# Patient Record
Sex: Female | Born: 1950 | Race: White | Hispanic: No | Marital: Married | Smoking: Never smoker
Health system: Southern US, Community
[De-identification: ages and names within clinical notes are randomized; demographics above are authoritative.]

## PROBLEM LIST (undated history)

## (undated) DIAGNOSIS — K219 Gastro-esophageal reflux disease without esophagitis: Secondary | ICD-10-CM

## (undated) DIAGNOSIS — C50919 Malignant neoplasm of unspecified site of unspecified female breast: Secondary | ICD-10-CM

## (undated) DIAGNOSIS — I10 Essential (primary) hypertension: Secondary | ICD-10-CM

## (undated) DIAGNOSIS — Z923 Personal history of irradiation: Secondary | ICD-10-CM

## (undated) DIAGNOSIS — J479 Bronchiectasis, uncomplicated: Secondary | ICD-10-CM

## (undated) DIAGNOSIS — Z853 Personal history of malignant neoplasm of breast: Secondary | ICD-10-CM

## (undated) DIAGNOSIS — H8110 Benign paroxysmal vertigo, unspecified ear: Secondary | ICD-10-CM

## (undated) DIAGNOSIS — M8580 Other specified disorders of bone density and structure, unspecified site: Secondary | ICD-10-CM

## (undated) HISTORY — DX: Personal history of malignant neoplasm of breast: Z85.3

## (undated) HISTORY — PX: APPENDECTOMY: SHX54

## (undated) HISTORY — DX: Gastro-esophageal reflux disease without esophagitis: K21.9

## (undated) HISTORY — DX: Essential (primary) hypertension: I10

## (undated) HISTORY — DX: Other specified disorders of bone density and structure, unspecified site: M85.80

## (undated) HISTORY — PX: ABDOMINAL HYSTERECTOMY: SHX81

## (undated) HISTORY — DX: Benign paroxysmal vertigo, unspecified ear: H81.10

---

## 1995-04-20 HISTORY — PX: NASAL SINUS SURGERY: SHX719

## 1997-08-08 ENCOUNTER — Other Ambulatory Visit: Admission: RE | Admit: 1997-08-08 | Discharge: 1997-08-08 | Payer: Self-pay | Admitting: Otolaryngology

## 1997-11-22 ENCOUNTER — Ambulatory Visit (HOSPITAL_COMMUNITY): Admission: RE | Admit: 1997-11-22 | Discharge: 1997-11-23 | Payer: Self-pay | Admitting: Otolaryngology

## 1998-03-10 ENCOUNTER — Ambulatory Visit (HOSPITAL_BASED_OUTPATIENT_CLINIC_OR_DEPARTMENT_OTHER): Admission: RE | Admit: 1998-03-10 | Discharge: 1998-03-10 | Payer: Self-pay | Admitting: Otolaryngology

## 1998-11-28 ENCOUNTER — Other Ambulatory Visit: Admission: RE | Admit: 1998-11-28 | Discharge: 1998-11-28 | Payer: Self-pay | Admitting: *Deleted

## 1999-12-09 ENCOUNTER — Other Ambulatory Visit: Admission: RE | Admit: 1999-12-09 | Discharge: 1999-12-09 | Payer: Self-pay | Admitting: *Deleted

## 2000-12-05 ENCOUNTER — Other Ambulatory Visit: Admission: RE | Admit: 2000-12-05 | Discharge: 2000-12-05 | Payer: Self-pay | Admitting: *Deleted

## 2001-12-11 ENCOUNTER — Other Ambulatory Visit: Admission: RE | Admit: 2001-12-11 | Discharge: 2001-12-11 | Payer: Self-pay | Admitting: *Deleted

## 2002-12-26 ENCOUNTER — Other Ambulatory Visit: Admission: RE | Admit: 2002-12-26 | Discharge: 2002-12-26 | Payer: Self-pay | Admitting: *Deleted

## 2003-01-03 ENCOUNTER — Encounter: Admission: RE | Admit: 2003-01-03 | Discharge: 2003-01-03 | Payer: Self-pay | Admitting: *Deleted

## 2003-01-03 ENCOUNTER — Encounter: Payer: Self-pay | Admitting: *Deleted

## 2003-06-27 ENCOUNTER — Encounter: Admission: RE | Admit: 2003-06-27 | Discharge: 2003-06-27 | Payer: Self-pay | Admitting: *Deleted

## 2003-12-27 ENCOUNTER — Other Ambulatory Visit: Admission: RE | Admit: 2003-12-27 | Discharge: 2003-12-27 | Payer: Self-pay | Admitting: Obstetrics and Gynecology

## 2004-01-07 ENCOUNTER — Encounter: Admission: RE | Admit: 2004-01-07 | Discharge: 2004-01-07 | Payer: Self-pay | Admitting: Obstetrics and Gynecology

## 2004-05-18 ENCOUNTER — Ambulatory Visit: Payer: Self-pay | Admitting: Internal Medicine

## 2004-11-17 ENCOUNTER — Ambulatory Visit: Payer: Self-pay | Admitting: Internal Medicine

## 2004-11-17 ENCOUNTER — Ambulatory Visit (HOSPITAL_COMMUNITY): Admission: RE | Admit: 2004-11-17 | Discharge: 2004-11-17 | Payer: Self-pay | Admitting: Internal Medicine

## 2005-01-27 ENCOUNTER — Other Ambulatory Visit: Admission: RE | Admit: 2005-01-27 | Discharge: 2005-01-27 | Payer: Self-pay | Admitting: Obstetrics and Gynecology

## 2005-02-02 ENCOUNTER — Encounter: Admission: RE | Admit: 2005-02-02 | Discharge: 2005-02-02 | Payer: Self-pay | Admitting: Obstetrics and Gynecology

## 2005-06-01 ENCOUNTER — Ambulatory Visit: Payer: Self-pay | Admitting: Internal Medicine

## 2005-06-07 ENCOUNTER — Ambulatory Visit: Payer: Self-pay | Admitting: Internal Medicine

## 2005-12-06 ENCOUNTER — Ambulatory Visit: Payer: Self-pay | Admitting: Internal Medicine

## 2006-03-16 ENCOUNTER — Encounter: Admission: RE | Admit: 2006-03-16 | Discharge: 2006-03-16 | Payer: Self-pay | Admitting: Obstetrics and Gynecology

## 2006-03-28 ENCOUNTER — Encounter (INDEPENDENT_AMBULATORY_CARE_PROVIDER_SITE_OTHER): Payer: Self-pay | Admitting: Diagnostic Radiology

## 2006-03-28 ENCOUNTER — Encounter: Admission: RE | Admit: 2006-03-28 | Discharge: 2006-03-28 | Payer: Self-pay | Admitting: Obstetrics and Gynecology

## 2006-03-28 ENCOUNTER — Encounter (INDEPENDENT_AMBULATORY_CARE_PROVIDER_SITE_OTHER): Payer: Self-pay | Admitting: *Deleted

## 2006-04-03 ENCOUNTER — Encounter: Admission: RE | Admit: 2006-04-03 | Discharge: 2006-04-03 | Payer: Self-pay | Admitting: Obstetrics and Gynecology

## 2006-04-19 DIAGNOSIS — Z923 Personal history of irradiation: Secondary | ICD-10-CM

## 2006-04-19 DIAGNOSIS — C50919 Malignant neoplasm of unspecified site of unspecified female breast: Secondary | ICD-10-CM

## 2006-04-19 HISTORY — DX: Personal history of irradiation: Z92.3

## 2006-04-19 HISTORY — DX: Malignant neoplasm of unspecified site of unspecified female breast: C50.919

## 2006-04-19 HISTORY — PX: BREAST LUMPECTOMY: SHX2

## 2006-05-19 ENCOUNTER — Encounter (INDEPENDENT_AMBULATORY_CARE_PROVIDER_SITE_OTHER): Payer: Self-pay | Admitting: *Deleted

## 2006-05-19 ENCOUNTER — Ambulatory Visit (HOSPITAL_BASED_OUTPATIENT_CLINIC_OR_DEPARTMENT_OTHER): Admission: RE | Admit: 2006-05-19 | Discharge: 2006-05-19 | Payer: Self-pay | Admitting: Surgery

## 2006-05-19 ENCOUNTER — Encounter: Admission: RE | Admit: 2006-05-19 | Discharge: 2006-05-19 | Payer: Self-pay | Admitting: Surgery

## 2006-05-30 ENCOUNTER — Ambulatory Visit: Payer: Self-pay | Admitting: Internal Medicine

## 2006-06-17 ENCOUNTER — Ambulatory Visit (HOSPITAL_COMMUNITY): Admission: RE | Admit: 2006-06-17 | Discharge: 2006-06-17 | Payer: Self-pay | Admitting: Surgery

## 2006-06-17 ENCOUNTER — Encounter (INDEPENDENT_AMBULATORY_CARE_PROVIDER_SITE_OTHER): Payer: Self-pay | Admitting: Specialist

## 2006-07-12 ENCOUNTER — Ambulatory Visit: Payer: Self-pay | Admitting: Oncology

## 2006-07-22 ENCOUNTER — Ambulatory Visit: Admission: RE | Admit: 2006-07-22 | Discharge: 2006-09-30 | Payer: Self-pay | Admitting: *Deleted

## 2006-08-15 LAB — COMPREHENSIVE METABOLIC PANEL
ALT: 11 U/L (ref 0–35)
CO2: 31 mEq/L (ref 19–32)
Sodium: 144 mEq/L (ref 135–145)
Total Bilirubin: 0.4 mg/dL (ref 0.3–1.2)
Total Protein: 7.1 g/dL (ref 6.0–8.3)

## 2006-08-15 LAB — CBC WITH DIFFERENTIAL/PLATELET
BASO%: 0.2 % (ref 0.0–2.0)
EOS%: 1.9 % (ref 0.0–7.0)
HCT: 37.2 % (ref 34.8–46.6)
LYMPH%: 30.7 % (ref 14.0–48.0)
MCH: 30.9 pg (ref 26.0–34.0)
MCHC: 35.3 g/dL (ref 32.0–36.0)
MONO%: 6.9 % (ref 0.0–13.0)
NEUT%: 60.3 % (ref 39.6–76.8)
Platelets: 191 10*3/uL (ref 145–400)
RBC: 4.25 10*6/uL (ref 3.70–5.32)

## 2006-12-01 ENCOUNTER — Ambulatory Visit: Payer: Self-pay | Admitting: Oncology

## 2006-12-05 ENCOUNTER — Ambulatory Visit: Payer: Self-pay | Admitting: Internal Medicine

## 2007-03-13 ENCOUNTER — Encounter: Admission: RE | Admit: 2007-03-13 | Discharge: 2007-03-13 | Payer: Self-pay | Admitting: Obstetrics and Gynecology

## 2007-06-12 ENCOUNTER — Ambulatory Visit: Payer: Self-pay | Admitting: Internal Medicine

## 2007-06-12 DIAGNOSIS — K219 Gastro-esophageal reflux disease without esophagitis: Secondary | ICD-10-CM | POA: Insufficient documentation

## 2007-06-12 DIAGNOSIS — M899 Disorder of bone, unspecified: Secondary | ICD-10-CM

## 2007-06-12 DIAGNOSIS — M949 Disorder of cartilage, unspecified: Secondary | ICD-10-CM

## 2007-06-12 DIAGNOSIS — I10 Essential (primary) hypertension: Secondary | ICD-10-CM

## 2007-06-12 DIAGNOSIS — Z853 Personal history of malignant neoplasm of breast: Secondary | ICD-10-CM

## 2007-06-12 DIAGNOSIS — J309 Allergic rhinitis, unspecified: Secondary | ICD-10-CM

## 2007-11-30 ENCOUNTER — Ambulatory Visit: Payer: Self-pay | Admitting: Oncology

## 2007-12-04 ENCOUNTER — Ambulatory Visit: Payer: Self-pay | Admitting: Internal Medicine

## 2007-12-04 LAB — CONVERTED CEMR LAB
ALT: 17 units/L (ref 0–35)
AST: 21 units/L (ref 0–37)
Albumin: 3.7 g/dL (ref 3.5–5.2)
Alkaline Phosphatase: 54 units/L (ref 39–117)
BUN: 8 mg/dL (ref 6–23)
Basophils Absolute: 0 10*3/uL (ref 0.0–0.1)
Basophils Relative: 0.9 % (ref 0.0–3.0)
Bilirubin Urine: NEGATIVE
Bilirubin, Direct: 0.1 mg/dL (ref 0.0–0.3)
CO2: 32 meq/L (ref 19–32)
Calcium: 9.3 mg/dL (ref 8.4–10.5)
Chloride: 108 meq/L (ref 96–112)
Cholesterol: 208 mg/dL (ref 0–200)
Creatinine, Ser: 0.7 mg/dL (ref 0.4–1.2)
Direct LDL: 135.8 mg/dL
Eosinophils Absolute: 0.2 10*3/uL (ref 0.0–0.7)
Eosinophils Relative: 3.2 % (ref 0.0–5.0)
GFR calc Af Amer: 111 mL/min
GFR calc non Af Amer: 92 mL/min
Glucose, Bld: 97 mg/dL (ref 70–99)
HCT: 36.7 % (ref 36.0–46.0)
HDL: 44.9 mg/dL (ref >39.0)
Hemoglobin, Urine: NEGATIVE
Hemoglobin: 12.8 g/dL (ref 12.0–15.0)
Ketones, ur: NEGATIVE mg/dL
Leukocytes, UA: NEGATIVE
Lymphocytes Relative: 24.7 % (ref 12.0–46.0)
MCHC: 35 g/dL (ref 30.0–36.0)
MCV: 91.2 fL (ref 78.0–100.0)
Monocytes Absolute: 0.4 10*3/uL (ref 0.1–1.0)
Monocytes Relative: 8.1 % (ref 3.0–12.0)
Neutro Abs: 3.1 10*3/uL (ref 1.4–7.7)
Neutrophils Relative %: 63.1 % (ref 43.0–77.0)
Nitrite: NEGATIVE
Platelets: 135 10*3/uL — ABNORMAL LOW (ref 150–400)
Potassium: 4.1 meq/L (ref 3.5–5.1)
RBC: 4.02 M/uL (ref 3.87–5.11)
RDW: 11.4 % — ABNORMAL LOW (ref 11.5–14.6)
Sodium: 143 meq/L (ref 135–145)
Specific Gravity, Urine: 1.015 (ref 1.000–1.03)
TSH: 2.21 microintl units/mL (ref 0.35–5.50)
Total Bilirubin: 0.7 mg/dL (ref 0.3–1.2)
Total CHOL/HDL Ratio: 4.6
Total Protein, Urine: NEGATIVE mg/dL
Total Protein: 6.2 g/dL (ref 6.0–8.3)
Triglycerides: 194 mg/dL — ABNORMAL HIGH (ref 0–149)
Urine Glucose: NEGATIVE mg/dL
Urobilinogen, UA: 0.2 (ref 0.0–1.0)
VLDL: 39 mg/dL (ref 0–40)
WBC: 4.9 10*3/uL (ref 4.5–10.5)
pH: 7 (ref 5.0–8.0)

## 2007-12-11 ENCOUNTER — Ambulatory Visit: Payer: Self-pay | Admitting: Internal Medicine

## 2008-02-02 ENCOUNTER — Ambulatory Visit: Payer: Self-pay | Admitting: Internal Medicine

## 2008-03-04 ENCOUNTER — Ambulatory Visit: Payer: Self-pay | Admitting: Internal Medicine

## 2008-04-01 ENCOUNTER — Encounter: Admission: RE | Admit: 2008-04-01 | Discharge: 2008-04-01 | Payer: Self-pay | Admitting: Obstetrics and Gynecology

## 2008-06-11 ENCOUNTER — Ambulatory Visit: Payer: Self-pay | Admitting: Internal Medicine

## 2008-11-06 ENCOUNTER — Ambulatory Visit: Payer: Self-pay | Admitting: Internal Medicine

## 2008-11-06 DIAGNOSIS — J209 Acute bronchitis, unspecified: Secondary | ICD-10-CM | POA: Insufficient documentation

## 2008-11-29 ENCOUNTER — Ambulatory Visit: Payer: Self-pay | Admitting: Oncology

## 2008-12-03 ENCOUNTER — Ambulatory Visit: Payer: Self-pay | Admitting: Internal Medicine

## 2008-12-03 LAB — CONVERTED CEMR LAB
ALT: 14 units/L (ref 0–35)
AST: 17 units/L (ref 0–37)
Albumin: 3.8 g/dL (ref 3.5–5.2)
Alkaline Phosphatase: 44 units/L (ref 39–117)
BUN: 10 mg/dL (ref 6–23)
Basophils Absolute: 0 10*3/uL (ref 0.0–0.1)
Basophils Relative: 0.7 % (ref 0.0–3.0)
Bilirubin Urine: NEGATIVE
Bilirubin, Direct: 0.1 mg/dL (ref 0.0–0.3)
CO2: 33 meq/L — ABNORMAL HIGH (ref 19–32)
Calcium: 9 mg/dL (ref 8.4–10.5)
Chloride: 105 meq/L (ref 96–112)
Cholesterol: 205 mg/dL — ABNORMAL HIGH (ref 0–200)
Creatinine, Ser: 0.6 mg/dL (ref 0.4–1.2)
Direct LDL: 129.2 mg/dL
Eosinophils Absolute: 0.1 10*3/uL (ref 0.0–0.7)
Eosinophils Relative: 2 % (ref 0.0–5.0)
GFR calc non Af Amer: 109.28 mL/min (ref >60)
Glucose, Bld: 93 mg/dL (ref 70–99)
HCT: 35.2 % — ABNORMAL LOW (ref 36.0–46.0)
HDL: 42.7 mg/dL (ref >39.00)
Hemoglobin, Urine: NEGATIVE
Hemoglobin: 12 g/dL (ref 12.0–15.0)
Ketones, ur: NEGATIVE mg/dL
Leukocytes, UA: NEGATIVE
Lymphocytes Relative: 25.7 % (ref 12.0–46.0)
Lymphs Abs: 1.2 10*3/uL (ref 0.7–4.0)
MCHC: 34 g/dL (ref 30.0–36.0)
MCV: 90.8 fL (ref 78.0–100.0)
Monocytes Absolute: 0.4 10*3/uL (ref 0.1–1.0)
Monocytes Relative: 8.4 % (ref 3.0–12.0)
Neutro Abs: 3 10*3/uL (ref 1.4–7.7)
Neutrophils Relative %: 63.2 % (ref 43.0–77.0)
Nitrite: NEGATIVE
Platelets: 139 10*3/uL — ABNORMAL LOW (ref 150.0–400.0)
Potassium: 4.1 meq/L (ref 3.5–5.1)
RBC: 3.87 M/uL (ref 3.87–5.11)
RDW: 12.3 % (ref 11.5–14.6)
Sodium: 143 meq/L (ref 135–145)
Specific Gravity, Urine: 1.02 (ref 1.000–1.030)
TSH: 2.68 microintl units/mL (ref 0.35–5.50)
Total Bilirubin: 0.7 mg/dL (ref 0.3–1.2)
Total CHOL/HDL Ratio: 5
Total Protein, Urine: NEGATIVE mg/dL
Total Protein: 6.6 g/dL (ref 6.0–8.3)
Triglycerides: 180 mg/dL — ABNORMAL HIGH (ref 0.0–149.0)
Urine Glucose: NEGATIVE mg/dL
Urobilinogen, UA: 0.2 (ref 0.0–1.0)
VLDL: 36 mg/dL (ref 0.0–40.0)
WBC: 4.7 10*3/uL (ref 4.5–10.5)
pH: 7 (ref 5.0–8.0)

## 2008-12-11 ENCOUNTER — Ambulatory Visit: Payer: Self-pay | Admitting: Internal Medicine

## 2008-12-11 DIAGNOSIS — R519 Headache, unspecified: Secondary | ICD-10-CM | POA: Insufficient documentation

## 2008-12-11 DIAGNOSIS — R51 Headache: Secondary | ICD-10-CM | POA: Insufficient documentation

## 2009-01-29 ENCOUNTER — Ambulatory Visit: Payer: Self-pay | Admitting: Internal Medicine

## 2009-01-29 DIAGNOSIS — M25519 Pain in unspecified shoulder: Secondary | ICD-10-CM

## 2009-02-10 ENCOUNTER — Ambulatory Visit: Payer: Self-pay | Admitting: Internal Medicine

## 2009-03-06 ENCOUNTER — Ambulatory Visit: Payer: Self-pay | Admitting: Internal Medicine

## 2009-04-02 ENCOUNTER — Encounter: Admission: RE | Admit: 2009-04-02 | Discharge: 2009-04-02 | Payer: Self-pay | Admitting: Oncology

## 2009-05-09 ENCOUNTER — Ambulatory Visit: Payer: Self-pay | Admitting: Internal Medicine

## 2009-07-24 ENCOUNTER — Encounter: Payer: Self-pay | Admitting: Internal Medicine

## 2009-07-31 ENCOUNTER — Encounter: Payer: Self-pay | Admitting: Internal Medicine

## 2009-09-11 ENCOUNTER — Encounter: Payer: Self-pay | Admitting: Internal Medicine

## 2009-10-23 ENCOUNTER — Encounter: Payer: Self-pay | Admitting: Internal Medicine

## 2009-12-05 ENCOUNTER — Ambulatory Visit: Payer: Self-pay | Admitting: Oncology

## 2009-12-09 ENCOUNTER — Ambulatory Visit: Payer: Self-pay | Admitting: Internal Medicine

## 2009-12-09 LAB — CONVERTED CEMR LAB
ALT: 12 units/L (ref 0–35)
AST: 21 units/L (ref 0–37)
Albumin: 4 g/dL (ref 3.5–5.2)
Alkaline Phosphatase: 45 units/L (ref 39–117)
BUN: 9 mg/dL (ref 6–23)
Basophils Absolute: 0 10*3/uL (ref 0.0–0.1)
Basophils Relative: 0.9 % (ref 0.0–3.0)
Bilirubin Urine: NEGATIVE
Bilirubin, Direct: 0.1 mg/dL (ref 0.0–0.3)
CO2: 31 meq/L (ref 19–32)
Calcium: 9.2 mg/dL (ref 8.4–10.5)
Chloride: 105 meq/L (ref 96–112)
Cholesterol: 206 mg/dL — ABNORMAL HIGH (ref 0–200)
Creatinine, Ser: 0.7 mg/dL (ref 0.4–1.2)
Direct LDL: 119.4 mg/dL
Eosinophils Absolute: 0.1 10*3/uL (ref 0.0–0.7)
Eosinophils Relative: 1.3 % (ref 0.0–5.0)
GFR calc non Af Amer: 97.55 mL/min (ref >60)
Glucose, Bld: 86 mg/dL (ref 70–99)
HCT: 34.9 % — ABNORMAL LOW (ref 36.0–46.0)
HDL: 40.8 mg/dL (ref >39.00)
Hemoglobin, Urine: NEGATIVE
Hemoglobin: 11.9 g/dL — ABNORMAL LOW (ref 12.0–15.0)
Ketones, ur: NEGATIVE mg/dL
Leukocytes, UA: NEGATIVE
Lymphocytes Relative: 28.8 % (ref 12.0–46.0)
Lymphs Abs: 1.4 10*3/uL (ref 0.7–4.0)
MCHC: 34 g/dL (ref 30.0–36.0)
MCV: 91.3 fL (ref 78.0–100.0)
Monocytes Absolute: 0.4 10*3/uL (ref 0.1–1.0)
Monocytes Relative: 7.2 % (ref 3.0–12.0)
Neutro Abs: 3.1 10*3/uL (ref 1.4–7.7)
Neutrophils Relative %: 61.8 % (ref 43.0–77.0)
Nitrite: NEGATIVE
Platelets: 146 10*3/uL — ABNORMAL LOW (ref 150.0–400.0)
Potassium: 4.2 meq/L (ref 3.5–5.1)
RBC: 3.82 M/uL — ABNORMAL LOW (ref 3.87–5.11)
RDW: 12.7 % (ref 11.5–14.6)
Sodium: 145 meq/L (ref 135–145)
Specific Gravity, Urine: >=1.03 (ref 1.000–1.030)
TSH: 1.82 microintl units/mL (ref 0.35–5.50)
Total Bilirubin: 0.4 mg/dL (ref 0.3–1.2)
Total CHOL/HDL Ratio: 5
Total Protein, Urine: NEGATIVE mg/dL
Total Protein: 6.4 g/dL (ref 6.0–8.3)
Triglycerides: 283 mg/dL — ABNORMAL HIGH (ref 0.0–149.0)
Urine Glucose: NEGATIVE mg/dL
Urobilinogen, UA: 0.2 (ref 0.0–1.0)
VLDL: 56.6 mg/dL — ABNORMAL HIGH (ref 0.0–40.0)
WBC: 5 10*3/uL (ref 4.5–10.5)
pH: 5.5 (ref 5.0–8.0)

## 2009-12-12 ENCOUNTER — Ambulatory Visit: Payer: Self-pay | Admitting: Internal Medicine

## 2009-12-12 ENCOUNTER — Encounter: Payer: Self-pay | Admitting: Internal Medicine

## 2009-12-12 DIAGNOSIS — H6120 Impacted cerumen, unspecified ear: Secondary | ICD-10-CM

## 2009-12-12 DIAGNOSIS — D485 Neoplasm of uncertain behavior of skin: Secondary | ICD-10-CM

## 2009-12-19 ENCOUNTER — Ambulatory Visit: Payer: Self-pay | Admitting: Internal Medicine

## 2010-02-28 ENCOUNTER — Ambulatory Visit: Payer: Self-pay | Admitting: Family Medicine

## 2010-03-18 ENCOUNTER — Telehealth: Payer: Self-pay | Admitting: Internal Medicine

## 2010-04-21 ENCOUNTER — Encounter
Admission: RE | Admit: 2010-04-21 | Discharge: 2010-04-21 | Payer: Self-pay | Source: Home / Self Care | Attending: Oncology | Admitting: Oncology

## 2010-04-21 LAB — HM MAMMOGRAPHY

## 2010-05-09 ENCOUNTER — Encounter: Payer: Self-pay | Admitting: Obstetrics and Gynecology

## 2010-05-10 ENCOUNTER — Encounter: Payer: Self-pay | Admitting: Oncology

## 2010-05-10 ENCOUNTER — Encounter: Payer: Self-pay | Admitting: Obstetrics and Gynecology

## 2010-05-21 NOTE — Assessment & Plan Note (Signed)
Summary: sinus problem---stc   Vital Signs:  Patient profile:   60 year old female Height:      63 inches Weight:      116 pounds BMI:     20.62 O2 Sat:      96 % on Room air Temp:     97.5 degrees F oral Pulse rate:   64 / minute Pulse rhythm:   regular Resp:     16 per minute BP sitting:   144 / 86  (left arm) Cuff size:   regular  Vitals Entered By: Lanier Prude, CMA(AAMA) (December 19, 2009 3:46 PM)  O2 Flow:  Room air CC: Rt side sinus pain/pressure Is Patient Diabetic? No   Primary Care Provider:  Tresa Garter MD  CC:  Rt side sinus pain/pressure.  History of Present Illness: The patient presents with complaints of R HA,sore throat, fever, sinus congestion and drainge of several days duration. Not better with OTC meds.  Muscle aches are present.  The mucus is colored.   Current Medications (verified): 1)  Hydrochlorothiazide 25 Mg  Tabs (Hydrochlorothiazide) .... Take 1 Tab By Mouth Every Morning 2)  Klor-Con 10 10 Meq  Tbcr (Potassium Chloride) .Marland Kitchen.. 1 By Mouth Qd 3)  Tamoxifen Citrate 20 Mg  Tabs (Tamoxifen Citrate) .Marland Kitchen.. 1 By Mouth Qd 4)  Vitamin D3 1000 Unit  Tabs (Cholecalciferol) .Marland Kitchen.. 1 By Mouth Qd 5)  Flonase 50 Mcg/act  Susp (Fluticasone Propionate) .... 2 Spr Once Daily Each Nostr 6)  Fioricet 50-325-40 Mg  Tabs (Butalbital-Apap-Caffeine) .Marland Kitchen.. 1-2 By Mouth Two Times A Day As Needed Headache 7)  Nadolol 40 Mg Tabs (Nadolol) .... 1/2 By Mouth Bid 8)  Pennsaid 1.5 % Soln (Diclofenac Sodium) .... 3-5 Gtt On Skin Three Times A Day For Pain  Allergies (verified): 1)  ! Biaxin 2)  ! Cipro 3)  Pcn  Past History:  Past Medical History: Last updated: 06/12/2007 Allergic rhinitis Breast cancer, hx of 2008 L GERD Hypertension Osteopenia  Physical Exam  General:  alert, well-developed, well-nourished, well-hydrated, appropriate dress, normal appearance, healthy-appearing, cooperative to examination, and good hygiene.   Head:  normocephalic and  atraumatic.  R cheek is tender Nose:  External nasal examination shows no deformity or inflammation. Nasal mucosa are pink and moist without lesions or exudates. Mouth:  Oral mucosa and oropharynx without lesions or exudates.  Teeth in good repair. Neck:  supple, full ROM, no masses, no cervical lymphadenopathy, and no neck tenderness.   Lungs:  normal respiratory effort, no intercostal retractions, no accessory muscle use, normal breath sounds, and no dullness.   Heart:  normal rate, regular rhythm, no murmur, and no gallop.   Abdomen:  soft, non-tender, normal bowel sounds, no distention, and no masses.   Msk:  L subacromial tender trigger tender points over L deltoid were palpated Extremities:    Skin:  R flank mole small Cervical Nodes:  no anterior cervical adenopathy and no posterior cervical adenopathy.   Psych:  Cognition and judgment appear intact. Alert and cooperative with normal attention span and concentration. No apparent delusions, illusions, hallucinations   Impression & Recommendations:  Problem # 1:  SINUSITIS, ACUTE (ICD-461.9) Assessment New     Flonase 50 Mcg/act Susp (Fluticasone propionate) .Marland Kitchen... 2 spr once daily each nostr    Bactrim Ds 800-160 Mg Tabs (Sulfamethoxazole-trimethoprim) .Marland Kitchen... 1 by mouth bid  Complete Medication List: 1)  Hydrochlorothiazide 25 Mg Tabs (Hydrochlorothiazide) .... Take 1 tab by mouth every morning 2)  Klor-con 10 10 Meq Tbcr (Potassium chloride) .Marland Kitchen.. 1 by mouth qd 3)  Tamoxifen Citrate 20 Mg Tabs (Tamoxifen citrate) .Marland Kitchen.. 1 by mouth qd 4)  Vitamin D3 1000 Unit Tabs (Cholecalciferol) .Marland Kitchen.. 1 by mouth qd 5)  Flonase 50 Mcg/act Susp (Fluticasone propionate) .... 2 spr once daily each nostr 6)  Fioricet 50-325-40 Mg Tabs (Butalbital-apap-caffeine) .Marland Kitchen.. 1-2 by mouth two times a day as needed headache 7)  Nadolol 40 Mg Tabs (Nadolol) .... 1/2 by mouth bid 8)  Pennsaid 1.5 % Soln (Diclofenac sodium) .... 3-5 gtt on skin three times a day for  pain 9)  Bactrim Ds 800-160 Mg Tabs (Sulfamethoxazole-trimethoprim) .Marland Kitchen.. 1 by mouth bid  Patient Instructions: 1)  Use over-the-counter medicines for "cold": Tylenol  650mg  or Advil 400mg  every 6 hours r Mucinex D for congestion. Ricola or Halls for sore throat. Office visit if not better or if worse.  Prescriptions: BACTRIM DS 800-160 MG TABS (SULFAMETHOXAZOLE-TRIMETHOPRIM) 1 by mouth bid  #20 x 1   Entered and Authorized by:   Tresa Garter MD   Signed by:   Tresa Garter MD on 12/19/2009   Method used:   Electronically to        [facility]  [id number] (retail)       [address]       [place], [place]  [address]       Ph: [phone] or [phone]       Fax: [phone]   RxID:   [id number] BACTRIM DS 800-160 MG TABS (SULFAMETHOXAZOLE-TRIMETHOPRIM) 1 by mouth bid  #20 x 1   Entered and Authorized by:   Tresa Garter MD   Signed by:   Tresa Garter MD on 12/19/2009   Method used:   Print then Give to Patient   RxID:   [id number] DOXYCYCLINE HYCLATE 100 MG CAPS (DOXYCYCLINE HYCLATE) 1 by mouth two times a day with a glass of water  #20 x 0   Entered and Authorized by:   Tresa Garter MD   Signed by:   Tresa Garter MD on 12/19/2009   Method used:   Print then Give to Patient   RxID:   [id number]

## 2010-05-21 NOTE — Assessment & Plan Note (Signed)
Summary: ARM PAIN--STC   Vital Signs:  Patient profile:   60 year old female Weight:      117 pounds Temp:     98.4 degrees F oral Pulse rate:   71 / minute BP sitting:   122 / 72  (left arm)  Vitals Entered By: Tora Perches (May 09, 2009 3:01 PM)  Procedure Note  Injections: The patient complains of pain. Duration of symptoms: > 3 months Indication: chronic pain Consent signed: yes  Procedure # 1: joint injection    Region: lateral    Location: left shoulder    Technique: 24 g needle    Medication: 40 mg depomedrol    Anesthesia: 4.0 ml 1% lidocaine w/o epinephrine  Procedure # 2: trigger point injection    Region: lateral    Location: L deltoid    Technique: 24 g needle    Medication: 10 mg depomedrol    Anesthesia: 1.0 ml 1% lidocaine w/o epinephrine    Comment: Risks including but not limited by incomplete procedure, bleeding, infection, recurrence were discussed with the patient. Consent form was signed.  Total trigger point inj x 4 - same fasion. Tolerated well. Complicatons - none. Good pain relief following the procedure.   Cleaned and prepped with: alcohol and betadine Wound dressing: bandaid  CC: pt c/o arm pain Is Patient Diabetic? No   Primary Care Provider:  Tresa Garter MD  CC:  pt c/o arm pain.  History of Present Illness: C/o L shoulder pain is worse now, asked for injections  Preventive Screening-Counseling & Management  Alcohol-Tobacco     Smoking Status: never  Current Medications (verified): 1)  Hydrochlorothiazide 25 Mg  Tabs (Hydrochlorothiazide) .... Take 1 Tab By Mouth Every Morning 2)  Klor-Con 10 10 Meq  Tbcr (Potassium Chloride) .Marland Kitchen.. 1 By Mouth Qd 3)  Tamoxifen Citrate 20 Mg  Tabs (Tamoxifen Citrate) .Marland Kitchen.. 1 By Mouth Qd 4)  Vitamin D3 1000 Unit  Tabs (Cholecalciferol) .Marland Kitchen.. 1 By Mouth Qd 5)  Flonase 50 Mcg/act  Susp (Fluticasone Propionate) .... 2 Spr Once Daily Each Nostr 6)  Fioricet 50-325-40 Mg  Tabs  (Butalbital-Apap-Caffeine) .Marland Kitchen.. 1-2 By Mouth Two Times A Day As Needed Headache 7)  Nadolol 40 Mg Tabs (Nadolol) .... 1/2 By Mouth Bid  Allergies: 1)  ! Biaxin 2)  ! Cipro 3)  Pcn  Past History:  Past Medical History: Last updated: 06/12/2007 Allergic rhinitis Breast cancer, hx of 2008 L GERD Hypertension Osteopenia  Social History: Last updated: 03/06/2009 Married Never Smoked Retired Alcohol use-no Drug use-no Regular exercise-yes  Physical Exam  General:  alert, well-developed, well-nourished, well-hydrated, appropriate dress, normal appearance, healthy-appearing, cooperative to examination, and good hygiene.   Msk:  L subacromial tender trigger tender points over L deltoid were palpated Neurologic:  No cranial nerve deficits noted. Station and gait are normal. Plantar reflexes are down-going bilaterally. DTRs are symmetrical throughout. Sensory, motor and coordinative functions appear intact. Skin:  turgor normal, color normal, no rashes, no suspicious lesions, and no ecchymoses.   Psych:  Cognition and judgment appear intact. Alert and cooperative with normal attention span and concentration. No apparent delusions, illusions, hallucinations   Impression & Recommendations:  Problem # 1:  SHOULDER PAIN (ICD-719.41) Assessment Deteriorated  The following medications were removed from the medication list:    Naprosyn 500 Mg Tabs (Naproxen) .Marland Kitchen... 1 two times a day pc x 2-3 wks, then prn  Orders: Joint Aspirate / Injection, Large (20610) Depo- Medrol 40mg  (J1030) Depo-  Medrol 40mg  (J1030) Trigger Point Injection (1 or 2 muscles) (16109)  Complete Medication List: 1)  Hydrochlorothiazide 25 Mg Tabs (Hydrochlorothiazide) .... Take 1 tab by mouth every morning 2)  Klor-con 10 10 Meq Tbcr (Potassium chloride) .Marland Kitchen.. 1 by mouth qd 3)  Tamoxifen Citrate 20 Mg Tabs (Tamoxifen citrate) .Marland Kitchen.. 1 by mouth qd 4)  Vitamin D3 1000 Unit Tabs (Cholecalciferol) .Marland Kitchen.. 1 by mouth  qd 5)  Flonase 50 Mcg/act Susp (Fluticasone propionate) .... 2 spr once daily each nostr 6)  Fioricet 50-325-40 Mg Tabs (Butalbital-apap-caffeine) .Marland Kitchen.. 1-2 by mouth two times a day as needed headache 7)  Nadolol 40 Mg Tabs (Nadolol) .... 1/2 by mouth bid 8)  Pennsaid 1.5 % Soln (Diclofenac sodium) .... 3-5 gtt on skin three times a day for pain  Patient Instructions: 1)  Call if you are not better in a reasonable amount of time or if worse.  Prescriptions: PENNSAID 1.5 % SOLN (DICLOFENAC SODIUM) 3-5 gtt on skin three times a day for pain  #1 x 3   Entered and Authorized by:   Tresa Garter MD   Signed by:   Tresa Garter MD on 05/09/2009   Method used:   Print then Give to Patient   RxID:   [id number]

## 2010-05-21 NOTE — Letter (Signed)
Summary: [facility]  [facility]   Imported By: Lester [place] 01/02/2010 13:55:51  _____________________________________________________________________  External Attachment:    Type:   Image     Comment:   External Document

## 2010-05-21 NOTE — Letter (Signed)
[facility]   Imported By: Lester [place] 11/06/2009 10:17:24  _____________________________________________________________________  External Attachment:    Type:   Image     Comment:   External Document

## 2010-05-21 NOTE — Assessment & Plan Note (Signed)
Summary: CPX /NWS  #   Vital Signs:  Patient profile:   60 year old female Height:      63 inches Weight:      116 pounds BMI:     20.62 Temp:     97.9 degrees F oral Pulse rate:   72 / minute Pulse rhythm:   regular Resp:     16 per minute BP sitting:   128 / 78  (left arm) Cuff size:   regular  Vitals Entered By: Lanier Prude, Beverly Gust) (December 12, 2009 10:15 AM) Comments pt is not taking Pennsaid   Primary Care Provider:  Tresa Garter MD   History of Present Illness: The patient presents for a preventive health examination  C/o dry mouth and eyes  Current Medications (verified): 1)  Hydrochlorothiazide 25 Mg  Tabs (Hydrochlorothiazide) .... Take 1 Tab By Mouth Every Morning 2)  Klor-Con 10 10 Meq  Tbcr (Potassium Chloride) .Marland Kitchen.. 1 By Mouth Qd 3)  Tamoxifen Citrate 20 Mg  Tabs (Tamoxifen Citrate) .Marland Kitchen.. 1 By Mouth Qd 4)  Vitamin D3 1000 Unit  Tabs (Cholecalciferol) .Marland Kitchen.. 1 By Mouth Qd 5)  Flonase 50 Mcg/act  Susp (Fluticasone Propionate) .... 2 Spr Once Daily Each Nostr 6)  Fioricet 50-325-40 Mg  Tabs (Butalbital-Apap-Caffeine) .Marland Kitchen.. 1-2 By Mouth Two Times A Day As Needed Headache 7)  Nadolol 40 Mg Tabs (Nadolol) .... 1/2 By Mouth Bid 8)  Pennsaid 1.5 % Soln (Diclofenac Sodium) .... 3-5 Gtt On Skin Three Times A Day For Pain  Allergies (verified): 1)  ! Biaxin 2)  ! Cipro 3)  Pcn  Past History:  Past Medical History: Last updated: 06/12/2007 Allergic rhinitis Breast cancer, hx of 2008 L GERD Hypertension Osteopenia  Past Surgical History: Last updated: 06/12/2007 Hysterectomy Lumpectomy L 2008  Family History: Last updated: 12/12/2009 Family History Hypertension M Schogren'  Social History: Last updated: 03/06/2009 Married Never Smoked Retired Alcohol use-no Drug use-no Regular exercise-yes  Family History: Family History Hypertension Judie Petit Landscape architect'  Review of Systems  The patient denies fever, chest pain, dyspnea on exertion, peripheral  edema, and abdominal pain.    Physical Exam  General:  alert, well-developed, well-nourished, well-hydrated, appropriate dress, normal appearance, healthy-appearing, cooperative to examination, and good hygiene.   Eyes:  No corneal or conjunctival inflammation noted. EOMI. Perrla. Ears:  R wax Nose:  External nasal examination shows no deformity or inflammation. Nasal mucosa are pink and moist without lesions or exudates. Mouth:  Oral mucosa and oropharynx without lesions or exudates.  Teeth in good repair. Neck:  supple, full ROM, no masses, no cervical lymphadenopathy, and no neck tenderness.   Lungs:  normal respiratory effort, no intercostal retractions, no accessory muscle use, normal breath sounds, and no dullness.   Heart:  normal rate, regular rhythm, no murmur, and no gallop.   Abdomen:  soft, non-tender, normal bowel sounds, no distention, and no masses.   Msk:  L subacromial tender trigger tender points over L deltoid were palpated Neurologic:  No cranial nerve deficits noted. Station and gait are normal. Plantar reflexes are down-going bilaterally. DTRs are symmetrical throughout. Sensory, motor and coordinative functions appear intact. Skin:  R flank mole small Psych:  Cognition and judgment appear intact. Alert and cooperative with normal attention span and concentration. No apparent delusions, illusions, hallucinations   Impression & Recommendations:  Problem # 1:  HEALTH MAINTENANCE EXAM (ICD-V70.0) Assessment New Health and age related issues were discussed. Available screening tests and vaccinations were discussed as well.  Healthy life style including good diet and exercise was discussed.  Flu shot Colon up to date The labs were reviewed with the patient.   Problem # 2:  Dry eyes/ mouth Hold HCTZ See "Patient Instructions".   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 Take 1 tab by mouth every morning    Nadolol 40 Mg Tabs (Nadolol) .Marland Kitchen... 1/2 by mouth bid  BP today: 128/78 Prior BP: 122/72 (05/09/2009)  Prior 10 Yr Risk Heart Disease: Not enough information (02/10/2009)  Labs Reviewed: K+: 4.2 (12/09/2009) Creat: : 0.7 (12/09/2009)   Chol: 206 (12/09/2009)   HDL: 40.80 (12/09/2009)   LDL: DEL (12/04/2007)   TG: 283.0 (12/09/2009)  Problem # 5:  HEADACHE (ICD-784.0) Assessment: Unchanged  Her updated medication list for this problem includes:    Fioricet 50-325-40 Mg Tabs (Butalbital-apap-caffeine) .Marland Kitchen... 1-2 by mouth two times a day as needed headache    Nadolol 40 Mg Tabs (Nadolol) .Marland Kitchen... 1/2 by mouth bid  Problem # 6:  CERUMEN IMPACTION (ICD-380.4) R Assessment: New Irrigate  Complete Medication List: 1)  Hydrochlorothiazide 25 Mg Tabs (Hydrochlorothiazide) .... Take 1 tab by mouth every morning 2)  Klor-con 10 10 Meq Tbcr (Potassium chloride) .Marland Kitchen.. 1 by mouth qd 3)  Tamoxifen Citrate 20 Mg Tabs (Tamoxifen citrate) .Marland Kitchen.. 1 by mouth qd 4)  Vitamin D3 1000 Unit Tabs (Cholecalciferol) .Marland Kitchen.. 1 by mouth qd 5)  Flonase 50 Mcg/act Susp (Fluticasone propionate) .... 2 spr once daily each nostr 6)  Fioricet 50-325-40 Mg Tabs (Butalbital-apap-caffeine) .Marland Kitchen.. 1-2 by mouth two times a day as needed headache 7)  Nadolol 40 Mg Tabs (Nadolol) .... 1/2 by mouth bid 8)  Pennsaid 1.5 % Soln (Diclofenac sodium) .... 3-5 gtt on skin three times a day for pain  Other Orders: EKG w/ Interpretation (93000) Admin 1st Vaccine (04540) Flu Vaccine 58yrs + (98119)  Patient Instructions: 1)  Lemon in water 2)  Hold HCTZ x 1 wk 3)  Please schedule a follow-up appointment in 6 months. 4)  BMP prior to visit, ICD-9: 5)  Lipid Panel prior to visit, ICD-9:272.20 6)  CBC w/ Diff prior to visit, ICD-9: 7)  iron and TIBC  280.9 8)  Skin biopsy w/me 9)  Irrigate R ear at home Prescriptions: NADOLOL 40 MG TABS (NADOLOL) 1/2 by mouth bid  #90 x 3   Entered and Authorized by:   Tresa Garter MD   Signed by:   Tresa Garter MD on 12/12/2009   Method used:   Print then Give to Patient   RxID:   [id number] FIORICET 50-325-40 MG  TABS (BUTALBITAL-APAP-CAFFEINE) 1-2 by mouth two times a day as needed headache  #60 x 1   Entered and Authorized by:   Tresa Garter MD   Signed by:   Tresa Garter MD on 12/12/2009   Method used:   Print then Give to Patient   RxID:   [id number] KLOR-CON 10 10 MEQ  TBCR (POTASSIUM CHLORIDE) 1 by mouth qd  #90 x 3   Entered and Authorized by:   Tresa Garter MD   Signed by:   Tresa Garter MD on 12/12/2009   Method used:   Print then Give to Patient   RxID:   [id number] HYDROCHLOROTHIAZIDE 25  MG  TABS (HYDROCHLOROTHIAZIDE) Take 1 tab by mouth every morning  #90 x 3   Entered and Authorized by:   Tresa Garter MD   Signed by:   Tresa Garter MD on 12/12/2009   Method used:   Print then Give to Patient   RxID:   [id number]    .lbflu   Flu Vaccine Consent Questions     Do you have a history of severe allergic reactions to this vaccine? no    Any prior history of allergic reactions to egg and/or gelatin? no    Do you have a sensitivity to the preservative Thimersol? no    Do you have a past history of Guillan-Barre Syndrome? no    Do you currently have an acute febrile illness? no    Have you ever had a severe reaction to latex? no    Vaccine information given and explained to patient? yes    Are you currently pregnant? no    Lot Number:[id number]   Exp Date:10/17/2010   Site Given  Left Deltoid IM...Marland KitchenMarland KitchenLanier Prude, [facility])  December 12, 2009 11:14 AM

## 2010-05-21 NOTE — Letter (Signed)
Summary: [facility]  [facility]   Imported By: Lester [place] 09/22/2009 09:49:49  _____________________________________________________________________  External Attachment:    Type:   Image     Comment:   External Document

## 2010-05-21 NOTE — Assessment & Plan Note (Signed)
Summary: COUGH/CONGESTION/RCD   Vital Signs:  Patient profile:   60 year old female Weight:      118 pounds BMI:     20.98 O2 Sat:      95 % on Room air Temp:     98.6 degrees F Pulse rate:   60 / minute BP sitting:   140 / 90  (left arm) Cuff size:   regular  Vitals Entered By: Lamar Sprinkles, CMA (February 28, 2010 10:34 AM)  O2 Flow:  Room air CC: sore throat, sinus drainage, slightly productive cough, mucus has "bad taste", otc's have given little relief./SD   History of Present Illness: The patient presents with complaints of R HA,sore throat, fever, sinus congestion and drainge of several days duration. Not better with OTC meds- Claritin, Mucinex.  Muscle aches are present, had subjective fever last night.  Cough became productive last night of yellowish/green sputum. No CP or SOB.  Multiple abx allergies- PCN, BIaxin, Cipro.  Current Medications (verified): 1)  Hydrochlorothiazide 25 Mg  Tabs (Hydrochlorothiazide) .... Take 1 Tab By Mouth Every Morning -On Hold Per Pcp 2)  Klor-Con 10 10 Meq  Tbcr (Potassium Chloride) .Marland Kitchen.. 1 By Mouth Once Daily- On Hold Per Pcp 3)  Tamoxifen Citrate 20 Mg  Tabs (Tamoxifen Citrate) .Marland Kitchen.. 1 By Mouth Qd 4)  Vitamin D3 1000 Unit  Tabs (Cholecalciferol) .Marland Kitchen.. 1 By Mouth Qd 5)  Fioricet 50-325-40 Mg  Tabs (Butalbital-Apap-Caffeine) .Marland Kitchen.. 1-2 By Mouth Two Times A Day As Needed Headache 6)  Nadolol 40 Mg Tabs (Nadolol) .... 1/2 By Mouth Bid 7)  Flax Seed Oil 1000 Mg Caps (Flaxseed (Linseed)) .... Daily 8)  Bactrim Ds 800-160 Mg Tabs (Sulfamethoxazole-Trimethoprim) .Marland Kitchen.. 1 Tab By Mouth Two Times A Day X 7 Days 9)  Cheratussin Ac 100-10 Mg/92ml Syrp (Guaifenesin-Codeine) .... 5 Ml By Mouth Two Times A Day As Needed Cough  Allergies (verified): 1)  ! Biaxin 2)  ! Cipro 3)  Pcn  Past History:  Past Medical History: Last updated: 06/12/2007 Allergic rhinitis Breast cancer, hx of 2008 L GERD Hypertension Osteopenia  Past Surgical History: Last  updated: 06/12/2007 Hysterectomy Lumpectomy L 2008  Family History: Last updated: 12/12/2009 Family History Hypertension M Schogren'  Social History: Last updated: 03/06/2009 Married Never Smoked Retired Alcohol use-no Drug use-no Regular exercise-yes  Risk Factors: Alcohol Use: 0 (03/06/2009) Exercise: yes (03/06/2009)  Risk Factors: Smoking Status: never (05/09/2009)  Review of Systems      See HPI General:  Complains of chills and fever. ENT:  Complains of nasal congestion, postnasal drainage, sinus pressure, and sore throat. Resp:  Complains of cough and sputum productive; denies shortness of breath and wheezing.  Physical Exam  General:  alert, well developed, afebrile, nontoxic appearing Ears:  R ear normal and L ear normal.   Mouth:  Oral mucosa and oropharynx without lesions or exudates.  Teeth in good repair. Lungs:  normal respiratory effort, no intercostal retractions, no accessory muscle use, normal breath sounds, and no dullness.   Heart:  normal rate, regular rhythm, no murmur, and no gallop.   Extremities:  No clubbing, cyanosis, edema, or deformity noted with normal full range of motion of all joints.   Psych:  Cognition and judgment appear intact. Alert and cooperative with normal attention span and concentration. No apparent delusions, illusions, hallucinations   Impression & Recommendations:  Problem # 1:  SINUSITIS, ACUTE (ICD-461.9) Assessment New Given duration and progression of symptoms, will treat for bacterial sinusitis with Bactrim.  Cheratussin for cough.  Supportive care as per pt instructions. The following medications were removed from the medication list:    Flonase 50 Mcg/act Susp (Fluticasone propionate) .Marland Kitchen... 2 spr once daily each nostr Her updated medication list for this problem includes:    Bactrim Ds 800-160 Mg Tabs (Sulfamethoxazole-trimethoprim) .Marland Kitchen... 1 tab by mouth two times a day x 7 days    Cheratussin Ac 100-10 Mg/24ml  Syrp (Guaifenesin-codeine) .Marland KitchenMarland KitchenMarland KitchenMarland Kitchen 5 ml by mouth two times a day as needed cough  Complete Medication List: 1)  Hydrochlorothiazide 25 Mg Tabs (Hydrochlorothiazide) .... Take 1 tab by mouth every morning -on hold per pcp 2)  Klor-con 10 10 Meq Tbcr (Potassium chloride) .Marland Kitchen.. 1 by mouth once daily- on hold per pcp 3)  Tamoxifen Citrate 20 Mg Tabs (Tamoxifen citrate) .Marland Kitchen.. 1 by mouth qd 4)  Vitamin D3 1000 Unit Tabs (Cholecalciferol) .Marland Kitchen.. 1 by mouth qd 5)  Fioricet 50-325-40 Mg Tabs (Butalbital-apap-caffeine) .Marland Kitchen.. 1-2 by mouth two times a day as needed headache 6)  Nadolol 40 Mg Tabs (Nadolol) .... 1/2 by mouth bid 7)  Flax Seed Oil 1000 Mg Caps (Flaxseed (linseed)) .... Daily 8)  Bactrim Ds 800-160 Mg Tabs (Sulfamethoxazole-trimethoprim) .Marland Kitchen.. 1 tab by mouth two times a day x 7 days 9)  Cheratussin Ac 100-10 Mg/74ml Syrp (Guaifenesin-codeine) .... 5 ml by mouth two times a day as needed cough  Patient Instructions: 1)  Take antibiotic as directed.  Drink lots of fluids.  Treat sympotmatically with Mucinex, nasal saline irrigation, and Tylenol/Ibuprofen. Also try claritin D or zyrtec D over the counter- two times a day as needed ( have to sign for them at pharmacy). You can use warm compresses.  Cough suppressant at night. Call if not improving as expected in 5-7 days.  Prescriptions: CHERATUSSIN AC 100-10 MG/5ML SYRP (GUAIFENESIN-CODEINE) 5 ml by mouth two times a day as needed cough  #4 ounces x 0   Entered and Authorized by:   Ruthe Mannan MD   Signed by:   Ruthe Mannan MD on 02/28/2010   Method used:   Print then Give to Patient   RxID:   [id number] BACTRIM DS 800-160 MG TABS (SULFAMETHOXAZOLE-TRIMETHOPRIM) 1 tab by mouth two times a day x 7 days  #14 x 0   Entered and Authorized by:   Ruthe Mannan MD   Signed by:   Ruthe Mannan MD on 02/28/2010   Method used:   Print then Give to Patient   RxID:   [id number]    Orders Added: 1)  Est. Patient Level III [29528]

## 2010-05-21 NOTE — Progress Notes (Signed)
Summary: Rf Flonase  Phone Note From Pharmacy   Caller: [facility]  [id number] Summary of Call: rec rf request for Flonase. not on active med list. ok to Rf?? Initial call taken by: Lanier Prude, [facility]),  March 18, 2010 5:15 PM  Follow-up for Phone Call        ok Follow-up by: Tresa Garter MD,  March 18, 2010 5:44 PM    New/Updated Medications: FLONASE 50 MCG/ACT SUSP (FLUTICASONE PROPIONATE) 1 spr each nostr qd as needed Prescriptions: FLONASE 50 MCG/ACT SUSP (FLUTICASONE PROPIONATE) 1 spr each nostr qd as needed  #3 x 3   Entered and Authorized by:   Tresa Garter MD   Signed by:   Lamar Sprinkles, CMA on 03/18/2010   Method used:   [facility] to        [facility]  [id number] (retail)       [address]       [place], [place]  [address]       Ph: [phone] or [phone]       Fax: [phone]   RxID:   [id number]

## 2010-05-21 NOTE — Miscellaneous (Signed)
Summary: Should[facility] & trigger point inj/[facility]  [facility]   Imported By: Sherian Rein 05/13/2009 15:48:19  _____________________________________________________________________  External Attachment:    Type:   Image     Comment:   External Document

## 2010-05-21 NOTE — Letter (Signed)
[facility]   Imported By: Sherian Rein 08/13/2009 08:01:09  _____________________________________________________________________  External Attachment:    Type:   Image     Comment:   External Document

## 2010-06-11 ENCOUNTER — Encounter (INDEPENDENT_AMBULATORY_CARE_PROVIDER_SITE_OTHER): Payer: Self-pay | Admitting: *Deleted

## 2010-06-11 ENCOUNTER — Other Ambulatory Visit: Payer: 59

## 2010-06-11 ENCOUNTER — Other Ambulatory Visit: Payer: Self-pay | Admitting: Internal Medicine

## 2010-06-11 DIAGNOSIS — D509 Iron deficiency anemia, unspecified: Secondary | ICD-10-CM

## 2010-06-11 DIAGNOSIS — E785 Hyperlipidemia, unspecified: Secondary | ICD-10-CM

## 2010-06-11 DIAGNOSIS — E7800 Pure hypercholesterolemia, unspecified: Secondary | ICD-10-CM

## 2010-06-11 LAB — CBC WITH DIFFERENTIAL/PLATELET
Basophils Absolute: 0 10*3/uL (ref 0.0–0.1)
Basophils Relative: 0.4 % (ref 0.0–3.0)
Eosinophils Absolute: 0.1 10*3/uL (ref 0.0–0.7)
Eosinophils Relative: 1.5 % (ref 0.0–5.0)
HCT: 36.5 % (ref 36.0–46.0)
Hemoglobin: 12.6 g/dL (ref 12.0–15.0)
Lymphocytes Relative: 30.5 % (ref 12.0–46.0)
Lymphs Abs: 1.4 10*3/uL (ref 0.7–4.0)
MCHC: 34.4 g/dL (ref 30.0–36.0)
MCV: 90.9 fl (ref 78.0–100.0)
Monocytes Absolute: 0.3 10*3/uL (ref 0.1–1.0)
Monocytes Relative: 7.3 % (ref 3.0–12.0)
Neutro Abs: 2.8 10*3/uL (ref 1.4–7.7)
Neutrophils Relative %: 60.3 % (ref 43.0–77.0)
Platelets: 155 10*3/uL (ref 150.0–400.0)
RBC: 4.01 Mil/uL (ref 3.87–5.11)
RDW: 12.7 % (ref 11.5–14.6)
WBC: 4.6 10*3/uL (ref 4.5–10.5)

## 2010-06-11 LAB — BASIC METABOLIC PANEL
BUN: 11 mg/dL (ref 6–23)
CO2: 31 mEq/L (ref 19–32)
Calcium: 9.3 mg/dL (ref 8.4–10.5)
Chloride: 106 mEq/L (ref 96–112)
Creatinine, Ser: 0.8 mg/dL (ref 0.4–1.2)
GFR: 73.72 mL/min (ref >60.00)
Glucose, Bld: 85 mg/dL (ref 70–99)
Potassium: 4.2 mEq/L (ref 3.5–5.1)
Sodium: 142 mEq/L (ref 135–145)

## 2010-06-11 LAB — LIPID PANEL
Cholesterol: 197 mg/dL (ref 0–200)
HDL: 42.1 mg/dL (ref >39.00)
Total CHOL/HDL Ratio: 5
Triglycerides: 207 mg/dL — ABNORMAL HIGH (ref 0.0–149.0)
VLDL: 41.4 mg/dL — ABNORMAL HIGH (ref 0.0–40.0)

## 2010-06-11 LAB — IBC PANEL
Iron: 103 ug/dL (ref 42–145)
Saturation Ratios: 25.7 % (ref 20.0–50.0)
Transferrin: 286.6 mg/dL (ref 212.0–360.0)

## 2010-06-16 ENCOUNTER — Ambulatory Visit (INDEPENDENT_AMBULATORY_CARE_PROVIDER_SITE_OTHER): Payer: 59 | Admitting: Internal Medicine

## 2010-06-16 ENCOUNTER — Encounter: Payer: Self-pay | Admitting: Internal Medicine

## 2010-06-16 ENCOUNTER — Other Ambulatory Visit: Payer: Self-pay | Admitting: Internal Medicine

## 2010-06-16 DIAGNOSIS — J309 Allergic rhinitis, unspecified: Secondary | ICD-10-CM

## 2010-06-16 DIAGNOSIS — D485 Neoplasm of uncertain behavior of skin: Secondary | ICD-10-CM

## 2010-06-25 NOTE — Miscellaneous (Signed)
Summary: Consent for Proced. / [place]  Consent for Proced. / [place]   Imported By: Lennie Odor 06/17/2010 10:14:14  _____________________________________________________________________  External Attachment:    Type:   Image     Comment:   External Document

## 2010-06-25 NOTE — Assessment & Plan Note (Signed)
Summary: SKIN BX/6 MO/FU/NWS   Vital Signs:  Patient profile:   60 year old female Height:      63 inches Weight:      117 pounds BMI:     20.80 Temp:     98.3 degrees F oral Pulse rate:   80 / minute Pulse rhythm:   regular Resp:     16 per minute BP sitting:   130 / 80  (left arm) Cuff size:   regular  Vitals Entered By: Lanier Prude, CMA(AAMA) (June 16, 2010 2:07 PM)  Procedure Note  Mole Biopsy/Removal: The patient complains of changing mole. Indication: changing lesion Consent signed: yes  Procedure # 1: shave biopsy    Size (in cm): 0.7 x 0.4    Region: lateral    Location: R flank    Comment: Risks including but not limited by incomplete procedure, bleeding, infection, recurrence were discussed with the patient. Consent form was signed. Tolerated well. Complicatons - none.     Instrument used: dermablade    Anesthesia: 0.5 ml 1% lidocaine w/epinephrine  Cleaned and prepped with: alcohol and betadine Wound dressing: bacitracin and bandaid Instructions: daily dressing changes  CC: 6 mo f/u, mole removal Is Patient Diabetic? No   Primary Care Provider:  Tresa Garter MD  CC:  6 mo f/u and mole removal.  History of Present Illness: C/o URI or allergies - runny nose x weeks, no fever Skin biopsy   Current Medications (verified): 1)  Hydrochlorothiazide 25 Mg  Tabs (Hydrochlorothiazide) .... Take 1 Tab By Mouth Every Morning -On Hold Per Pcp 2)  Klor-Con 10 10 Meq  Tbcr (Potassium Chloride) .Marland Kitchen.. 1 By Mouth Once Daily- On Hold Per Pcp 3)  Tamoxifen Citrate 20 Mg  Tabs (Tamoxifen Citrate) .Marland Kitchen.. 1 By Mouth Qd 4)  Vitamin D3 1000 Unit  Tabs (Cholecalciferol) .Marland Kitchen.. 1 By Mouth Qd 5)  Fioricet 50-325-40 Mg  Tabs (Butalbital-Apap-Caffeine) .Marland Kitchen.. 1-2 By Mouth Two Times A Day As Needed Headache 6)  Nadolol 40 Mg Tabs (Nadolol) .... 1/2 By Mouth Bid 7)  Flax Seed Oil 1000 Mg Caps (Flaxseed (Linseed)) .... Daily 8)  Bactrim Ds 800-160 Mg Tabs  (Sulfamethoxazole-Trimethoprim) .Marland Kitchen.. 1 Tab By Mouth Two Times A Day X 7 Days 9)  Cheratussin Ac 100-10 Mg/68ml Syrp (Guaifenesin-Codeine) .... 5 Ml By Mouth Two Times A Day As Needed Cough 10)  Flonase 50 Mcg/act Susp (Fluticasone Propionate) .Marland Kitchen.. 1 Spr Each Nostr Qd As Needed  Allergies (verified): 1)  ! Biaxin 2)  ! Cipro 3)  Pcn  Past History:  Past Medical History: Last updated: 06/12/2007 Allergic rhinitis Breast cancer, hx of 2008 L GERD Hypertension Osteopenia  Social History: Last updated: 03/06/2009 Married Never Smoked Retired Alcohol use-no Drug use-no Regular exercise-yes  Review of Systems  The patient denies fever, chest pain, syncope, and prolonged cough.    Physical Exam  General:  alert, well developed, afebrile, nontoxic appearing Nose:  swollen mucosa Mouth:  WNL Lungs:  normal respiratory effort, no intercostal retractions, no accessory muscle use, normal breath sounds, and no dullness.   Heart:  normal rate, regular rhythm, no murmur, and no gallop.   Skin:  R flank mole  7x4 mm   Impression & Recommendations:  Problem # 1:  HYPERTENSION (ICD-401.9) Assessment Unchanged  Her updated medication list for this problem includes:    Hydrochlorothiazide 25 Mg Tabs (Hydrochlorothiazide) .Marland Kitchen... Take 1 tab by mouth every morning -on hold per pcp    Nadolol 40 Mg  Tabs (Nadolol) .Marland Kitchen... 1/2 by mouth bid  BP today: 130/80 Prior BP: 140/90 (02/28/2010)  Prior 10 Yr Risk Heart Disease: Not enough information (02/10/2009)  Labs Reviewed: K+: 4.2 (06/11/2010) Creat: : 0.8 (06/11/2010)   Chol: 197 (06/11/2010)   HDL: 42.10 (06/11/2010)   LDL: DEL (12/04/2007)   TG: 207.0 (06/11/2010)  Problem # 2:  NEOPLASM OF UNCERTAIN BEHAVIOR OF SKIN (ICD-238.2) Assessment: Deteriorated  Orders: Shave Skin Lesion 0.6-1.0 cm/trunk/arm/leg (11301)  Problem # 3:  ALLERGIC RHINITIS (ICD-477.9) vs URI Assessment: Deteriorated  Her updated medication list for this  problem includes:    Flonase 50 Mcg/act Susp (Fluticasone propionate) .Marland Kitchen... 1 spr each nostr qd as needed  Complete Medication List: 1)  Hydrochlorothiazide 25 Mg Tabs (Hydrochlorothiazide) .... Take 1 tab by mouth every morning -on hold per pcp 2)  Klor-con 10 10 Meq Tbcr (Potassium chloride) .Marland Kitchen.. 1 by mouth once daily- on hold per pcp 3)  Tamoxifen Citrate 20 Mg Tabs (Tamoxifen citrate) .Marland Kitchen.. 1 by mouth qd 4)  Vitamin D3 1000 Unit Tabs (Cholecalciferol) .Marland Kitchen.. 1 by mouth qd 5)  Fioricet 50-325-40 Mg Tabs (Butalbital-apap-caffeine) .Marland Kitchen.. 1-2 by mouth two times a day as needed headache 6)  Nadolol 40 Mg Tabs (Nadolol) .... 1/2 by mouth bid 7)  Flax Seed Oil 1000 Mg Caps (Flaxseed (linseed)) .... Daily 8)  Bactrim Ds 800-160 Mg Tabs (Sulfamethoxazole-trimethoprim) .Marland Kitchen.. 1 tab by mouth two times a day x 7 days 9)  Cheratussin Ac 100-10 Mg/62ml Syrp (Guaifenesin-codeine) .... 5 ml by mouth two times a day as needed cough 10)  Flonase 50 Mcg/act Susp (Fluticasone propionate) .Marland Kitchen.. 1 spr each nostr qd as needed 11)  Bactrim Ds 800-160 Mg Tabs (Sulfamethoxazole-trimethoprim) .Marland Kitchen.. 1 by mouth bid  Patient Instructions: 1)  Use the Sinus rinse as needed  2)  Please schedule a follow-up appointment in 6 months well w/labs. Prescriptions: FLONASE 50 MCG/ACT SUSP (FLUTICASONE PROPIONATE) 1 spr each nostr qd as needed  #1 x 3   Entered and Authorized by:   Tresa Garter MD   Signed by:   Tresa Garter MD on 06/16/2010   Method used:   Print then Give to Patient   RxID:   [id number] BACTRIM DS 800-160 MG TABS (SULFAMETHOXAZOLE-TRIMETHOPRIM) 1 by mouth bid  #20 x 1   Entered and Authorized by:   Tresa Garter MD   Signed by:   Tresa Garter MD on 06/16/2010   Method used:   Print then Give to Patient   RxID:   [id number] AMOXICILLIN 500 MG CAPS (AMOXICILLIN) 2 caps by mouth bid  #40 x 0   Entered and Authorized by:   Tresa Garter MD   Signed by:   Tresa Garter MD on 06/16/2010   Method used:   Print then Give to Patient   RxID:   [id number] FLONASE 50 MCG/ACT SUSP (FLUTICASONE PROPIONATE) 1 spr each nostr qd as needed  #1 x 3   Entered and Authorized by:   Tresa Garter MD   Signed by:   Tresa Garter MD on 06/16/2010   Method used:   Print then Give to Patient   RxID:   [id number] AMOXICILLIN 500 MG CAPS (AMOXICILLIN) 2 caps by mouth bid  #40 x 0   Entered and Authorized by:   Tresa Garter MD   Signed by:   Tresa Garter MD on 06/16/2010   Method used:   Print then Give to Patient  RxID:   [id number]    Orders Added: 1)  Est. Patient Level III [95621] 2)  Shave Skin Lesion 0.6-1.0 cm/trunk/arm/leg [11301]

## 2010-09-04 NOTE — Op Note (Signed)
NAMEBILLI, BRIGHT                   ACCOUNT NO.:  [id number]   MEDICAL RECORD NO.:  [id number]          PATIENT TYPE:  AMB   LOCATION:  DSC                          FACILITY:  [facility]   PHYSICIAN:  Thomas A. Cornett, M.D.DATE OF BIRTH:  10-24-1950   DATE OF PROCEDURE:  05/19/2006  DATE OF DISCHARGE:                               OPERATIVE REPORT   PREOPERATIVE DIAGNOSIS:  Left breast ductal carcinoma in situ.   POSTOPERATIVE DIAGNOSIS:  Left breast ductal carcinoma in situ.   PROCEDURE:  Left breast needle-localized lumpectomy.   SURGEON:  Harriette Bouillon, M.D.   ANESTHESIA:  MAC with 32 mL of 0.25% Sensorcaine with epinephrine.   ESTIMATED BLOOD LOSS:  20 mL.   SPECIMEN:  Left breast tissue with localizing wire verified by  radiograph to be adequate with clips in center of tissue to pathology.   INDICATIONS FOR PROCEDURE:  The patient is a 60 year old female recently  diagnosed with left breast ductal carcinoma in situ.  She wished to  undergo breast conservation surgery and is here today for needle  localized lumpectomy for excision of this.  The procedure was discussed  as well as other options with the patient but she wanted to proceed with  lumpectomy.  Informed consent was obtained and she agreed to proceed.   DESCRIPTION OF PROCEDURE:  The patient was brought to the operating room  after undergoing left breast needle localization by the radiologist.  The left breast was prepped and draped in sterile fashion.  I  infiltrated the area with 0.25% Sensorcaine in the left upper outer  quadrant where the wire exited.  Incision was made and the wire was  pulled out through the incision.  The tissue around the wire was  excised.  Of note, there was very dense fibrocystic type changes.  I was  able to get the entire area out with the wire and clip and sent it to  pathology.  I reincised a medial superficial margin over this area and  put a stitch in the new surgical margin.  The  deep margin was down to  the muscle.  Superior and inferior margins appeared grossly clear with  to the fibrocystic change, it was very difficult to tell.  Given the  fact that the specimen was adequate by radiographs, I felt that at this  point there is no further need to any more excision until the final  pathology comes back, given the nature of this entire the left upper  outer quadrant which is very fibrocystic.  At this point, I then  irrigated out the wound found hemostasis to be  good and then closed the wound in layers using 3-0 Vicryl and 4-0  Monocryl.  Dermabond was then applied as a dressing.  All final counts  of sponge, needle and instruments were found to be correct this portion  of the case.  The patient was awoke, taken to recovery in satisfactory  condition.      Thomas A. Cornett, M.D.  Electronically Signed     TAC/MEDQ  D:  05/19/2006  T:  05/19/2006  Job:  [id number]   cc:   Georgina Quint. Plotnikov, MD

## 2010-09-04 NOTE — Op Note (Signed)
NAMEGERMANI, Lindsay Schultz                   ACCOUNT NO.:  [id number]   MEDICAL RECORD NO.:  [id number]          PATIENT TYPE:  AMB   LOCATION:  DAY                          [facility]ACILITY[facility]  [facility]   PHYSICIAN:  Thomas A. Cornett, M.D.DATE OF BIRTH:  February 13, 1951   DATE OF PROCEDURE:  06/17/2006  DATE OF DISCHARGE:                               OPERATIVE REPORT   PREOPERATIVE DIAGNOSIS:  Left breast ductal carcinoma in situ.   POSTOPERATIVE DIAGNOSIS:  Left breast ductal carcinoma in situ.   PROCEDURE:  Re-excision left breast lumpectomy.   SURGEON:  Maisie Fus A. Cornett, M.D.   ANESTHESIA:  MAC with 20 mL of 0.25% Sensorcaine plain.   ESTIMATED BLOOD LOSS:  10 mL.   SPECIMENS:  Inferior margin stitch on new surgical inferior margin to  pathology.   DRAINS:  None.   INDICATIONS FOR PROCEDURE:  The patient is a 60 year old female recently  diagnosed with left breast ductal carcinoma in situ.  She underwent  needle localized lumpectomy with positive inferior margin and returns  today for re-excision.  The procedure was discussed with the patient and  she agrees to proceed.   DESCRIPTION OF PROCEDURE:  The patient was brought to the operating room  and placed supine.  The left breast was prepped and draped in a sterile  fashion.  After MAC anesthesia, the previous left upper outer quadrant  breast incision was reopened.  I removed all suture material.  The  inferior part of the cavity was identified and I incised this with two  separate pieces with stitches in the new inferior margin.  Hemostasis  was achieved with cautery.  The wound was closed in layers with a 3-0  Vicryl and 4-0 Monocryl.  Dermabond was applied to the skin.  All fine  counts of sponge, needle, and instrument were found to be correct during  this portion of the case.  The patient was then awakened and taken to  the recovery room in satisfactory condition.      Thomas A. Cornett, M.D.  Electronically Signed     TAC/MEDQ   D:  06/17/2006  T:  06/17/2006  Job:  [id number]

## 2010-11-11 ENCOUNTER — Other Ambulatory Visit: Payer: Self-pay | Admitting: Internal Medicine

## 2010-12-10 ENCOUNTER — Other Ambulatory Visit: Payer: Self-pay | Admitting: Oncology

## 2010-12-10 ENCOUNTER — Encounter (HOSPITAL_BASED_OUTPATIENT_CLINIC_OR_DEPARTMENT_OTHER): Payer: 59 | Admitting: Oncology

## 2010-12-10 DIAGNOSIS — Z170 Estrogen receptor positive status [ER+]: Secondary | ICD-10-CM

## 2010-12-10 DIAGNOSIS — D0590 Unspecified type of carcinoma in situ of unspecified breast: Secondary | ICD-10-CM

## 2010-12-10 DIAGNOSIS — Z853 Personal history of malignant neoplasm of breast: Secondary | ICD-10-CM

## 2010-12-17 ENCOUNTER — Encounter: Payer: Self-pay | Admitting: Internal Medicine

## 2010-12-17 ENCOUNTER — Ambulatory Visit (INDEPENDENT_AMBULATORY_CARE_PROVIDER_SITE_OTHER): Payer: 59 | Admitting: Internal Medicine

## 2010-12-17 DIAGNOSIS — M899 Disorder of bone, unspecified: Secondary | ICD-10-CM

## 2010-12-17 DIAGNOSIS — Z853 Personal history of malignant neoplasm of breast: Secondary | ICD-10-CM

## 2010-12-17 DIAGNOSIS — J309 Allergic rhinitis, unspecified: Secondary | ICD-10-CM

## 2010-12-17 DIAGNOSIS — I10 Essential (primary) hypertension: Secondary | ICD-10-CM

## 2010-12-17 MED ORDER — NADOLOL 40 MG PO TABS: 20.0000 mg | ORAL_TABLET | Freq: Two times a day (BID) | ORAL | Status: DC

## 2010-12-17 MED ORDER — FLUTICASONE PROPIONATE 50 MCG/ACT NA SUSP: 1.0000 | Freq: Every day | NASAL | Status: DC

## 2010-12-18 ENCOUNTER — Encounter: Payer: Self-pay | Admitting: Internal Medicine

## 2010-12-18 NOTE — Assessment & Plan Note (Signed)
Vit D 

## 2010-12-18 NOTE — Assessment & Plan Note (Signed)
Continue with current prescription therapy as reflected on the Med list.  

## 2010-12-18 NOTE — Progress Notes (Signed)
  Subjective:    Patient ID: Lindsay Schultz, female    DOB: Mar 01, 1951, 60 y.o.   MRN: [id number]  HPI  The patient presents for a follow-up of  chronic hypertension, breast ca, allergies controlled with medicines    Review of Systems  Constitutional: Negative for chills, activity change, appetite change, fatigue and unexpected weight change.  HENT: Negative for congestion, mouth sores and sinus pressure.   Eyes: Negative for visual disturbance.  Respiratory: Negative for cough and chest tightness.   Gastrointestinal: Negative for nausea and abdominal pain.  Genitourinary: Negative for frequency, difficulty urinating and vaginal pain.  Musculoskeletal: Negative for back pain and gait problem.  Skin: Negative for pallor and rash.  Neurological: Negative for dizziness, tremors, weakness, numbness and headaches.  Psychiatric/Behavioral: Negative for confusion and sleep disturbance.       Objective:   Physical Exam  Constitutional: She appears well-developed and well-nourished. No distress.  HENT:  Head: Normocephalic.  Right Ear: External ear normal.  Left Ear: External ear normal.  Nose: Nose normal.  Mouth/Throat: Oropharynx is clear and moist.  Eyes: Conjunctivae are normal. Pupils are equal, round, and reactive to light. Right eye exhibits no discharge. Left eye exhibits no discharge.  Neck: Normal range of motion. Neck supple. No JVD present. No tracheal deviation present. No thyromegaly present.  Cardiovascular: Normal rate, regular rhythm and normal heart sounds.   Pulmonary/Chest: No stridor. No respiratory distress. She has no wheezes.  Abdominal: Soft. Bowel sounds are normal. She exhibits no distension and no mass. There is no tenderness. There is no rebound and no guarding.  Musculoskeletal: She exhibits no edema and no tenderness.  Lymphadenopathy:    She has no cervical adenopathy.  Neurological: She displays normal reflexes. No cranial nerve deficit. She exhibits normal  muscle tone. Coordination normal.  Skin: No rash noted. No erythema.  Psychiatric: She has a normal mood and affect. Her behavior is normal. Judgment and thought content normal.          Assessment & Plan:

## 2011-02-08 ENCOUNTER — Ambulatory Visit: Payer: 59

## 2011-02-19 ENCOUNTER — Encounter: Payer: Self-pay | Admitting: Internal Medicine

## 2011-02-19 ENCOUNTER — Ambulatory Visit (INDEPENDENT_AMBULATORY_CARE_PROVIDER_SITE_OTHER): Payer: 59 | Admitting: Internal Medicine

## 2011-02-19 VITALS — BP 140/90 | HR 80 | Temp 98.3°F | Resp 16 | Wt 119.0 lb

## 2011-02-19 DIAGNOSIS — I10 Essential (primary) hypertension: Secondary | ICD-10-CM

## 2011-02-19 DIAGNOSIS — R21 Rash and other nonspecific skin eruption: Secondary | ICD-10-CM

## 2011-02-19 DIAGNOSIS — J019 Acute sinusitis, unspecified: Secondary | ICD-10-CM

## 2011-02-19 MED ORDER — LORATADINE 10 MG PO TABS: 10.0000 mg | ORAL_TABLET | Freq: Every day | ORAL | Status: DC

## 2011-02-19 MED ORDER — SULFAMETHOXAZOLE-TRIMETHOPRIM 800-160 MG PO TABS: 1.0000 | ORAL_TABLET | Freq: Two times a day (BID) | ORAL | Status: AC

## 2011-02-19 MED ORDER — TRIAMCINOLONE ACETONIDE 0.5 % EX CREA: TOPICAL_CREAM | Freq: Three times a day (TID) | CUTANEOUS | Status: DC

## 2011-02-19 NOTE — Assessment & Plan Note (Signed)
Triamc bid Claritin qd

## 2011-02-19 NOTE — Patient Instructions (Signed)
BP Readings from Last 3 Encounters:  02/19/11 140/90  12/17/10 132/82  06/16/10 130/80    Normal BP<130/85

## 2011-02-19 NOTE — Progress Notes (Signed)
  Subjective:    Patient ID: Lindsay Schultz, female    DOB: 1951-03-05, 60 y.o.   MRN: [id number]  HPI   C/o R sinus pain x 2 wks C/o rash since Mon BP is ok  Review of Systems  Constitutional: Negative for chills.  HENT: Positive for congestion, rhinorrhea and postnasal drip.   Respiratory: Negative for cough.        Objective:   Physical Exam  Constitutional: She appears well-developed and well-nourished. No distress.  HENT:  Head: Normocephalic.  Right Ear: External ear normal.  Left Ear: External ear normal.  Nose: Nose normal.  Mouth/Throat: Oropharynx is clear and moist.       eryth nasal mucosa  Eyes: Conjunctivae are normal. Pupils are equal, round, and reactive to light. Right eye exhibits no discharge. Left eye exhibits no discharge.  Neck: Normal range of motion. Neck supple. No JVD present. No tracheal deviation present. No thyromegaly present.  Cardiovascular: Normal rate, regular rhythm and normal heart sounds.   Pulmonary/Chest: No stridor. No respiratory distress. She has no wheezes.  Abdominal: Soft. Bowel sounds are normal. She exhibits no distension and no mass. There is no tenderness. There is no rebound and no guarding.  Musculoskeletal: She exhibits no edema and no tenderness.  Lymphadenopathy:    She has no cervical adenopathy.  Neurological: She displays normal reflexes. No cranial nerve deficit. She exhibits normal muscle tone. Coordination normal.  Skin: No rash noted. No erythema.  Psychiatric: She has a normal mood and affect. Her behavior is normal. Judgment and thought content normal.   3-5 mm eryth maculas or flat papules on B UE, LEs and chest, NT       Assessment & Plan:

## 2011-02-19 NOTE — Assessment & Plan Note (Signed)
She stopped HCTZ a while ago. BP has been WNL. Will watch

## 2011-02-19 NOTE — Assessment & Plan Note (Signed)
See meds 

## 2011-03-04 ENCOUNTER — Encounter: Payer: Self-pay | Admitting: Gastroenterology

## 2011-03-20 ENCOUNTER — Ambulatory Visit (INDEPENDENT_AMBULATORY_CARE_PROVIDER_SITE_OTHER): Payer: 59 | Admitting: Family Medicine

## 2011-03-20 ENCOUNTER — Encounter: Payer: Self-pay | Admitting: Family Medicine

## 2011-03-20 VITALS — BP 140/80 | HR 66 | Temp 99.2°F | Wt 120.0 lb

## 2011-03-20 DIAGNOSIS — J329 Chronic sinusitis, unspecified: Secondary | ICD-10-CM

## 2011-03-20 MED ORDER — CEFDINIR 300 MG PO CAPS: 300.0000 mg | ORAL_CAPSULE | Freq: Two times a day (BID) | ORAL | Status: DC

## 2011-03-20 MED ORDER — METHYLPREDNISOLONE ACETATE PF 80 MG/ML IJ SUSP
120.0000 mg | Freq: Once | INTRAMUSCULAR | Status: AC
  Administered 2011-03-20: 120 mg via INTRAMUSCULAR

## 2011-03-20 NOTE — Progress Notes (Signed)
Addended by: Vernie Murders on: 03/20/2011 02:27 PM   Modules accepted: Orders

## 2011-03-20 NOTE — Progress Notes (Signed)
  Subjective:    Patient ID: Lindsay Schultz, female    DOB: 07-09-1950, 60 y.o.   MRN: [id number]  HPI Here for 6 weeks of sius pressure, HA, PND, blowing green mucus from the nose, and a dry cough. No fever. She took a course of Septra DS a few weeks ago with no improvement.    Review of Systems  Constitutional: Negative.   HENT: Positive for congestion, sore throat, postnasal drip and sinus pressure.   Eyes: Negative.   Respiratory: Positive for cough.        Objective:   Physical Exam  Constitutional: She appears well-developed and well-nourished.  HENT:  Right Ear: External ear normal.  Left Ear: External ear normal.  Nose: Nose normal.  Mouth/Throat: Oropharynx is clear and moist. No oropharyngeal exudate.  Eyes: Conjunctivae are normal.  Neck: No thyromegaly present.  Pulmonary/Chest: Effort normal and breath sounds normal.  Lymphadenopathy:    She has no cervical adenopathy.          Assessment & Plan:  Partially treated sinusitis

## 2011-03-22 ENCOUNTER — Telehealth: Payer: Self-pay

## 2011-03-22 NOTE — Telephone Encounter (Signed)
[facility] Triage Call Report Triage Record Num: [id number] Operator: Griselda Miner Patient Name: Lindsay Schultz Call Date & Time: 03/20/2011 6:11:52PM Patient Phone: [phone] PCP: Sonda Primes Patient Gender: Female PCP Fax : [phone] Patient DOB: 12/04/1950 [facility] Name: [facility] Reason for Call: Caller: Ala Bent; PCP: Sonda Primes; CB#: [phone]; Call Reason: Rx had not been called in ; Sx Onset:NA ; Sx Notes:NA ; Afebrile; Wt: ; Guideline Used:Office NOte ; Disp:NA; Appt Scheduled?: No In returning call patient stated that they could not find the Rx that had been called in--found that it had been called in to the wrong pharmacy and they had found it. No further service needed. Protocol(s) Used: Office Note Recommended Outcome per Protocol: Information Noted and Sent to Office Reason for Outcome: Caller information to office Care Advice: ~ 03/20/2011 6:16:43PM Page 1 of 1 [email or web address]

## 2011-03-22 NOTE — Telephone Encounter (Signed)
Omnicef was Rx'd by Dr Clent Ridges 12/01. Per pt, it was sent into the wrong pharmacy but she was able to pick the medicine up. She is also feeling better.

## 2011-03-22 NOTE — Telephone Encounter (Signed)
What Rx? Thx 

## 2011-03-22 NOTE — Telephone Encounter (Signed)
Noted. Thx.

## 2011-04-27 ENCOUNTER — Ambulatory Visit
Admission: RE | Admit: 2011-04-27 | Discharge: 2011-04-27 | Disposition: A | Discharge status: Home / Self Care | Payer: 59 | Source: Ambulatory Visit | Attending: Oncology | Admitting: Oncology

## 2011-04-27 DIAGNOSIS — Z853 Personal history of malignant neoplasm of breast: Secondary | ICD-10-CM

## 2011-05-11 ENCOUNTER — Ambulatory Visit: Payer: 59

## 2011-06-02 ENCOUNTER — Ambulatory Visit (INDEPENDENT_AMBULATORY_CARE_PROVIDER_SITE_OTHER): Payer: 59 | Admitting: Internal Medicine

## 2011-06-02 ENCOUNTER — Encounter: Payer: Self-pay | Admitting: Internal Medicine

## 2011-06-02 VITALS — BP 148/88 | HR 80 | Temp 98.9°F | Resp 16 | Wt 117.0 lb

## 2011-06-02 DIAGNOSIS — I10 Essential (primary) hypertension: Secondary | ICD-10-CM

## 2011-06-02 DIAGNOSIS — J309 Allergic rhinitis, unspecified: Secondary | ICD-10-CM

## 2011-06-02 DIAGNOSIS — R05 Cough: Secondary | ICD-10-CM

## 2011-06-02 DIAGNOSIS — J069 Acute upper respiratory infection, unspecified: Secondary | ICD-10-CM

## 2011-06-02 DIAGNOSIS — J019 Acute sinusitis, unspecified: Secondary | ICD-10-CM

## 2011-06-02 MED ORDER — CEFDINIR 300 MG PO CAPS: 300.0000 mg | ORAL_CAPSULE | Freq: Two times a day (BID) | ORAL | Status: DC

## 2011-06-02 MED ORDER — GUAIFENESIN-CODEINE 100-10 MG/5ML PO SYRP: 5.0000 mL | ORAL_SOLUTION | Freq: Two times a day (BID) | ORAL | Status: DC | PRN

## 2011-06-02 MED ORDER — BUTALBITAL-APAP-CAFFEINE 50-325-40 MG PO TABS: 1.0000 | ORAL_TABLET | Freq: Four times a day (QID) | ORAL | Status: DC | PRN

## 2011-06-02 MED ORDER — METHYLPREDNISOLONE ACETATE PF 80 MG/ML IJ SUSP
120.0000 mg | Freq: Once | INTRAMUSCULAR | Status: AC
  Administered 2011-06-02: 120 mg via INTRAMUSCULAR

## 2011-06-05 ENCOUNTER — Encounter: Payer: Self-pay | Admitting: Internal Medicine

## 2011-06-05 NOTE — Assessment & Plan Note (Signed)
Continue with current prescription therapy as reflected on the Med list.  

## 2011-06-05 NOTE — Assessment & Plan Note (Signed)
See Meds 

## 2011-06-05 NOTE — Progress Notes (Signed)
  Subjective:    Patient ID: Lindsay Schultz, female    DOB: Dec 21, 1950, 61 y.o.   MRN: [id number]  HPI   HPI  C/o URI w/sinusitis sx's x  14 days. Not better with OTC medicines. Actually, the patient is getting worse.   Review of Systems  Constitutional: Positive for fever, chills and fatigue.  HENT: Positive for congestion, rhinorrhea, sneezing and postnasal drip.   Eyes: Positive for photophobia and pain. Negative for discharge and visual disturbance.  Respiratory: Neg for cough and wheezing.    Gastrointestinal: Negative for vomiting, abdominal pain, diarrhea and abdominal distention.  Genitourinary: Negative for dysuria and difficulty urinating.  Skin: Negative for rash.  Neurological: Neg for dizziness, weakness and light-headedness.      Review of Systems     Objective:   Physical Exam  Constitutional: She appears well-developed. No distress.  HENT:  Head: Normocephalic.  Right Ear: External ear normal.  Left Ear: External ear normal.  Nose: Nose normal.  Mouth/Throat: Oropharynx is clear and moist. No oropharyngeal exudate.  Eyes: Conjunctivae are normal. Pupils are equal, round, and reactive to light. Right eye exhibits no discharge. Left eye exhibits no discharge.  Neck: Normal range of motion. Neck supple. No JVD present. No tracheal deviation present. No thyromegaly present.  Cardiovascular: Normal rate, regular rhythm and normal heart sounds.   Pulmonary/Chest: No stridor. No respiratory distress. She has no wheezes.  Abdominal: Soft. Bowel sounds are normal. She exhibits no distension and no mass. There is no tenderness. There is no rebound and no guarding.  Musculoskeletal: She exhibits no edema and no tenderness.  Lymphadenopathy:    She has no cervical adenopathy.  Neurological: She displays normal reflexes. No cranial nerve deficit. She exhibits normal muscle tone. Coordination normal.  Skin: No rash noted. No erythema.  Psychiatric: She has a normal mood and  affect. Her behavior is normal. Judgment and thought content normal.          Assessment & Plan:

## 2011-06-17 ENCOUNTER — Ambulatory Visit: Payer: 59 | Admitting: Internal Medicine

## 2011-06-26 ENCOUNTER — Encounter: Payer: Self-pay | Admitting: Family Medicine

## 2011-06-26 ENCOUNTER — Ambulatory Visit (INDEPENDENT_AMBULATORY_CARE_PROVIDER_SITE_OTHER): Payer: 59 | Admitting: Family Medicine

## 2011-06-26 VITALS — BP 150/90 | Temp 97.8°F | Wt 114.0 lb

## 2011-06-26 DIAGNOSIS — R300 Dysuria: Secondary | ICD-10-CM

## 2011-06-26 LAB — POCT URINALYSIS DIPSTICK
Blood, UA: NEGATIVE
Glucose, UA: NEGATIVE
Spec Grav, UA: <=1.005
Urobilinogen, UA: NEGATIVE

## 2011-06-26 MED ORDER — SULFAMETHOXAZOLE-TRIMETHOPRIM 800-160 MG PO TABS: 1.0000 | ORAL_TABLET | Freq: Two times a day (BID) | ORAL | Status: AC

## 2011-06-26 NOTE — Progress Notes (Signed)
SUBJECTIVE: Lindsay Schultz is a 61 y.o. female who complains of urinary frequency, urgency and dysuria x 4 days, without flank pain, fever, chills, or abnormal vaginal discharge or bleeding.   Allergic to PCN and cipro.   Patient Active Problem List  Diagnoses  . NEOPLASM OF UNCERTAIN BEHAVIOR OF SKIN  . CERUMEN IMPACTION  . HYPERTENSION  . BRONCHITIS, ACUTE  . ALLERGIC RHINITIS  . GERD  . SHOULDER PAIN  . OSTEOPENIA  . HEADACHE  . BREAST CANCER, HX OF  . Sinusitis acute  . Skin rash   Past Medical History  Diagnosis Date  . Allergic rhinitis   . Hx of breast cancer   . GERD (gastroesophageal reflux disease)   . HTN (hypertension)   . Osteopenia    Past Surgical History  Procedure Date  . Abdominal hysterectomy   . Breast lumpectomy 2008    Left   History  Substance Use Topics  . Smoking status: Never Smoker   . Smokeless tobacco: Not on file  . Alcohol Use: No   Family History  Problem Relation Age of Onset  . Sjogren's syndrome Mother   . Hypertension Other    Allergies  Allergen Reactions  . Ciprofloxacin     REACTION: rash along the vein w/IV Cipro  . Clarithromycin     REACTION: rash  . Penicillins     REACTION: passed out after a shot at 61 y.o   Current Outpatient Prescriptions on File Prior to Visit  Medication Sig Dispense Refill  . butalbital-acetaminophen-caffeine (FIORICET, ESGIC) 50-325-40 MG per tablet Take 1 tablet by mouth every 6 (six) hours as needed for headache.  100 tablet  3  . Cholecalciferol 1000 UNITS tablet Take 1,000 Units by mouth daily.        . Flaxseed, Linseed, (FLAX SEED OIL) 1000 MG CAPS Take 1 capsule by mouth daily.        . fluticasone (FLONASE) 50 MCG/ACT nasal spray Place 1 spray into the nose daily.  16 g  11  . nadolol (CORGARD) 40 MG tablet Take 0.5 tablets (20 mg total) by mouth 2 (two) times daily.  180 tablet  3  . tamoxifen (NOLVADEX) 20 MG tablet Take 20 mg by mouth daily.        Marland Kitchen triamcinolone (KENALOG) 0.5 %  cream Apply topically 3 (three) times daily.  60 g  0  . cefdinir (OMNICEF) 300 MG capsule Take 1 capsule (300 mg total) by mouth 2 (two) times daily.  20 capsule  0  . cefdinir (OMNICEF) 300 MG capsule Take 1 capsule (300 mg total) by mouth 2 (two) times daily.  28 capsule  0  . guaiFENesin-codeine (ROBITUSSIN AC) 100-10 MG/5ML syrup Take 5 mLs by mouth 2 (two) times daily as needed.  240 mL  0  . loratadine (CLARITIN) 10 MG tablet Take 1 tablet (10 mg total) by mouth daily.  30 tablet  3   The PMH, PSH, Social History, Family History, Medications, and allergies have been reviewed in [facility], and have been updated if relevant.  OBJECTIVE: BP 150/90  Temp(Src) 97.8 F (36.6 C) (Oral)  Wt 114 lb (51.71 kg)  Appears well, in no apparent distress.  Vital signs are normal. The abdomen is soft without tenderness, guarding, mass, rebound or organomegaly. No CVA tenderness or inguinal adenopathy noted. Urine dipstick shows negative for all components.   ASSESSMENT: UA negative but symptoms very consistent with UTI.  Will treat for UTI uncomplicated without evidence of  pyelonephritis with bactrim ds- 1 tab po twice daily x 3 days.    PLAN: Treatment per orders - also push fluids, may use Pyridium OTC prn. Call or return to clinic prn if these symptoms worsen or fail to improve as anticipated.

## 2011-06-26 NOTE — Patient Instructions (Signed)

## 2011-07-27 ENCOUNTER — Encounter: Payer: Self-pay | Admitting: Internal Medicine

## 2011-07-27 ENCOUNTER — Ambulatory Visit (INDEPENDENT_AMBULATORY_CARE_PROVIDER_SITE_OTHER): Payer: 59 | Admitting: Internal Medicine

## 2011-07-27 VITALS — BP 140/90 | HR 84 | Temp 98.3°F | Resp 16 | Wt 115.0 lb

## 2011-07-27 DIAGNOSIS — J45909 Unspecified asthma, uncomplicated: Secondary | ICD-10-CM | POA: Insufficient documentation

## 2011-07-27 DIAGNOSIS — J309 Allergic rhinitis, unspecified: Secondary | ICD-10-CM

## 2011-07-27 MED ORDER — BUDESONIDE-FORMOTEROL FUMARATE 160-4.5 MCG/ACT IN AERO: 2.0000 | INHALATION_SPRAY | Freq: Two times a day (BID) | RESPIRATORY_TRACT | Status: DC

## 2011-07-27 MED ORDER — DOXYCYCLINE HYCLATE 100 MG PO TABS: 100.0000 mg | ORAL_TABLET | Freq: Two times a day (BID) | ORAL | Status: DC

## 2011-07-27 NOTE — Assessment & Plan Note (Signed)
Worse - Depo 120 mg im

## 2011-07-27 NOTE — Assessment & Plan Note (Signed)
Depo 120 mg Symbicort MDI Doxy if worse

## 2011-07-27 NOTE — Progress Notes (Signed)
Patient ID: Lindsay Schultz, female   DOB: Apr 18, 1951, 61 y.o.   MRN: [id number]  Subjective:    Patient ID: Lindsay Schultz, female    DOB: June 30, 1950, 61 y.o.   MRN: [id number]  Cough     HPI  C/o allergies sx's x  4-5 days. Not better with OTC medicines. Actually, the patient is getting worse.   Review of Systems  Constitutional: Negative for fever, chills and fatigue.  HENT: Positive for congestion, rhinorrhea, sneezing and postnasal drip.   Eyes: Positive for photophobia and pain. Negative for discharge and visual disturbance.  Respiratory: Neg for cough and wheezing.    Gastrointestinal: Negative for vomiting, abdominal pain, diarrhea and abdominal distention.  Genitourinary: Negative for dysuria and difficulty urinating.  Skin: Negative for rash.  Neurological: Neg for dizziness, weakness and light-headedness.      Review of Systems  Respiratory: Positive for cough.        Objective:   Physical Exam  Constitutional: She appears well-developed. No distress.  HENT:  Head: Normocephalic.  Right Ear: External ear normal.  Left Ear: External ear normal.  Nose: Nose normal.  Mouth/Throat: Oropharynx is clear and moist. No oropharyngeal exudate.  Eyes: Conjunctivae are normal. Pupils are equal, round, and reactive to light. Right eye exhibits no discharge. Left eye exhibits no discharge.  Neck: Normal range of motion. Neck supple. No JVD present. No tracheal deviation present. No thyromegaly present.  Cardiovascular: Normal rate, regular rhythm and normal heart sounds.   Pulmonary/Chest: No stridor. No respiratory distress. She has no wheezes.  Abdominal: Soft. Bowel sounds are normal. She exhibits no distension and no mass. There is no tenderness. There is no rebound and no guarding.  Musculoskeletal: She exhibits no edema and no tenderness.  Lymphadenopathy:    She has no cervical adenopathy.  Neurological: She displays normal reflexes. No cranial nerve deficit. She exhibits  normal muscle tone. Coordination normal.  Skin: No rash noted. No erythema.  Psychiatric: She has a normal mood and affect. Her behavior is normal. Judgment and thought content normal.     I personally provided the Symbicort inhaler use teaching. After the teaching patient was able to demonstrate it's use effectively. All questions were answered      Assessment & Plan:

## 2011-12-02 ENCOUNTER — Other Ambulatory Visit: Payer: 59 | Admitting: Lab

## 2011-12-02 ENCOUNTER — Other Ambulatory Visit: Payer: Self-pay | Admitting: *Deleted

## 2011-12-02 ENCOUNTER — Ambulatory Visit (HOSPITAL_BASED_OUTPATIENT_CLINIC_OR_DEPARTMENT_OTHER): Payer: 59 | Admitting: Lab

## 2011-12-02 DIAGNOSIS — Z853 Personal history of malignant neoplasm of breast: Secondary | ICD-10-CM

## 2011-12-02 LAB — COMPREHENSIVE METABOLIC PANEL
Alkaline Phosphatase: 46 U/L (ref 39–117)
BUN: 10 mg/dL (ref 6–23)
Creatinine, Ser: 0.65 mg/dL (ref 0.50–1.10)
Glucose, Bld: 102 mg/dL — ABNORMAL HIGH (ref 70–99)
Sodium: 142 mEq/L (ref 135–145)
Total Bilirubin: 0.3 mg/dL (ref 0.3–1.2)

## 2011-12-02 LAB — CBC WITH DIFFERENTIAL/PLATELET
Basophils Absolute: 0 10*3/uL (ref 0.0–0.1)
Eosinophils Absolute: 0.1 10*3/uL (ref 0.0–0.5)
LYMPH%: 23.2 % (ref 14.0–49.7)
MCV: 92.3 fL (ref 79.5–101.0)
MONO%: 8.5 % (ref 0.0–14.0)
NEUT#: 3.9 10*3/uL (ref 1.5–6.5)
Platelets: 170 10*3/uL (ref 145–400)
RBC: 3.9 10*6/uL (ref 3.70–5.45)

## 2011-12-02 LAB — CANCER ANTIGEN 27.29: CA 27.29: 21 U/mL (ref 0–39)

## 2011-12-06 ENCOUNTER — Ambulatory Visit: Payer: 59 | Admitting: Internal Medicine

## 2011-12-09 ENCOUNTER — Ambulatory Visit (HOSPITAL_BASED_OUTPATIENT_CLINIC_OR_DEPARTMENT_OTHER): Payer: 59 | Admitting: Oncology

## 2011-12-09 ENCOUNTER — Encounter: Payer: Self-pay | Admitting: Internal Medicine

## 2011-12-09 ENCOUNTER — Ambulatory Visit (INDEPENDENT_AMBULATORY_CARE_PROVIDER_SITE_OTHER): Payer: 59 | Admitting: Internal Medicine

## 2011-12-09 VITALS — BP 168/72 | HR 63 | Temp 98.3°F | Resp 20 | Ht 63.0 in | Wt 114.0 lb

## 2011-12-09 VITALS — BP 130/88 | HR 72 | Temp 98.2°F | Resp 16 | Wt 113.0 lb

## 2011-12-09 DIAGNOSIS — Z853 Personal history of malignant neoplasm of breast: Secondary | ICD-10-CM

## 2011-12-09 DIAGNOSIS — I10 Essential (primary) hypertension: Secondary | ICD-10-CM

## 2011-12-09 DIAGNOSIS — J309 Allergic rhinitis, unspecified: Secondary | ICD-10-CM

## 2011-12-09 DIAGNOSIS — D0590 Unspecified type of carcinoma in situ of unspecified breast: Secondary | ICD-10-CM

## 2011-12-09 DIAGNOSIS — K219 Gastro-esophageal reflux disease without esophagitis: Secondary | ICD-10-CM

## 2011-12-09 DIAGNOSIS — Z170 Estrogen receptor positive status [ER+]: Secondary | ICD-10-CM

## 2011-12-09 MED ORDER — NADOLOL 40 MG PO TABS: 20.0000 mg | ORAL_TABLET | Freq: Two times a day (BID) | ORAL | Status: DC

## 2011-12-09 MED ORDER — FLUTICASONE PROPIONATE 50 MCG/ACT NA SUSP: 1.0000 | Freq: Every day | NASAL | Status: DC

## 2011-12-09 NOTE — Assessment & Plan Note (Signed)
Doing well Continue with current prescription therapy as reflected on the Med list.  

## 2011-12-09 NOTE — Progress Notes (Signed)
  Subjective:    Patient ID: Lindsay Schultz, female    DOB: 10-17-1950, 61 y.o.   MRN: [id number]  HPI     BP Readings from Last 3 Encounters:  12/09/11 130/88  07/27/11 140/90  06/26/11 150/90   Wt Readings from Last 3 Encounters:  12/09/11 113 lb (51.256 kg)  07/27/11 115 lb (52.164 kg)  06/26/11 114 lb (51.71 kg)       Review of Systems  Constitutional: Negative for chills, activity change, appetite change, fatigue and unexpected weight change.  HENT: Negative for congestion, mouth sores and sinus pressure.   Eyes: Negative for visual disturbance.  Respiratory: Negative for cough and chest tightness.   Gastrointestinal: Negative for nausea and abdominal pain.  Genitourinary: Negative for frequency, difficulty urinating and vaginal pain.  Musculoskeletal: Negative for back pain and gait problem.  Skin: Negative for pallor and rash.  Neurological: Negative for dizziness, tremors, weakness, numbness and headaches.  Psychiatric/Behavioral: Negative for confusion and disturbed wake/sleep cycle.       Objective:   Physical Exam  Constitutional: She appears well-developed. No distress.  HENT:  Head: Normocephalic.  Right Ear: External ear normal.  Left Ear: External ear normal.  Nose: Nose normal.  Mouth/Throat: Oropharynx is clear and moist. No oropharyngeal exudate.  Eyes: Conjunctivae are normal. Pupils are equal, round, and reactive to light. Right eye exhibits no discharge. Left eye exhibits no discharge.  Neck: Normal range of motion. Neck supple. No JVD present. No tracheal deviation present. No thyromegaly present.  Cardiovascular: Normal rate, regular rhythm and normal heart sounds.   Pulmonary/Chest: No stridor. No respiratory distress. She has no wheezes.  Abdominal: Soft. Bowel sounds are normal. She exhibits no distension and no mass. There is no tenderness. There is no rebound and no guarding.  Musculoskeletal: She exhibits no edema and no tenderness.    Lymphadenopathy:    She has no cervical adenopathy.  Neurological: She displays normal reflexes. No cranial nerve deficit. She exhibits normal muscle tone. Coordination normal.  Skin: No rash noted. No erythema.  Psychiatric: She has a normal mood and affect. Her behavior is normal. Judgment and thought content normal.   Lab Results  Component Value Date   WBC 5.8 12/02/2011   HGB 12.0 12/02/2011   HCT 36.0 12/02/2011   PLT 170 12/02/2011   GLUCOSE 102* 12/02/2011   CHOL 197 06/11/2010   TRIG 207.0* 06/11/2010   HDL 42.10 06/11/2010   LDLDIRECT 127.5 06/11/2010   ALT 13 12/02/2011   AST 18 12/02/2011   NA 142 12/02/2011   K 3.9 12/02/2011   CL 107 12/02/2011   CREATININE 0.65 12/02/2011   BUN 10 12/02/2011   CO2 29 12/02/2011   TSH 1.82 12/09/2009          Assessment & Plan:

## 2011-12-09 NOTE — Assessment & Plan Note (Signed)
Continue with current prescription therapy as reflected on the Med list.  

## 2011-12-09 NOTE — Progress Notes (Signed)
ID: Lindsay Schultz   DOB: 1950-05-13  MR#: [id number]  WUJ#:[id number]  HISTORY OF PRESENT ILLNESS: Lindsay Schultz had a screening mammogram showing some left breast calcifications.  [facility] by Dr. Henderson Cloud and their diagnostic unilateral left mammogram showed a cluster of pleomorphic calcifications deep in the upper outer portion of the left breast.  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    With this information, the patient was referred to Dr. Luisa Hart and on 04/03/2006, bilateral breast MRIs were obtained.  This showed only the solitary area of enhancement in the left breast corresponding to the prior biopsy.  There were no other suspicious areas in either breast and no evidence of lymphadenopathy.  There were scattered subcentimeter cysts bilaterally.    Accordingly, on 05/19/2006, the patient proceeded to needle localization lumpectomy with no need for sentinel lymph node biopsy under Dr. Luisa Hart.  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  There was no evidence of necrosis here, but the inferior margin was positive.  Accordingly, on 06/17/2006, Dr. Luisa Hart performed re-excision to clear the margin and the final result there ([id number]) showed no evidence of carcinoma in situ or other cancer in the specimen. Her subsequent history is as detailed below.  INTERVAL HISTORY: Lindsay Schultz returns today for routine followup of her breast cancer. The interval history is entirely unremarkable. Family is doing well. She is trying to exercise regularly, although this is more away shouldn't radiology at this point.  REVIEW OF SYSTEMS: A detailed review of systems today was entirely noncontributory  PAST MEDICAL HISTORY: Past Medical History    Diagnosis Date  . Allergic rhinitis   . Hx of breast cancer   . GERD (gastroesophageal reflux disease)   . HTN (hypertension)   . Osteopenia     PAST SURGICAL HISTORY: Past Surgical History  Procedure Date  . Abdominal hysterectomy   . Breast lumpectomy 2008    Left    FAMILY HISTORY Family History  Problem Relation Age of Onset  . Sjogren's syndrome Mother   . Hypertension Other   The patient's father died at the age of 70 from a heart attack.  The patient's mother died at the age of 17 from a CVA.  The patient had 1 brother who had Hodgkin disease at age 12, and developed leukemia approximately 30 years later, which took his life.    GYNECOLOGIC HISTORY: She is GX P2, first pregnancy age 48.  After her hysterectomy in 1995, she was treated with estrogen until recently with no side effects.    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  Her husband, Lindsay Schultz, is retired from [employer].  He used to work at the zoo-he was a Psychologist, forensic.  Their daughter, Lindsay Schultz, 18, lives in [place] and their son, Lindsay Schultz, 48, also lives in [place] both work for [employer].  The patient has 2 grandchildren.  She attends the [employer] in [employer].   ADVANCED DIRECTIVES:  HEALTH MAINTENANCE: History  Substance Use Topics  . Smoking status: Never Smoker   . Smokeless tobacco: Not on file  . Alcohol Use: No     Colonoscopy:  PAP:  Bone  density:  Lipid panel:  Allergies  Allergen Reactions  . Ciprofloxacin     REACTION: rash along the vein w/IV Cipro  . Clarithromycin     REACTION: rash  . Penicillins     REACTION: passed out after a shot at 61 y.o    Current Outpatient Prescriptions  Medication Sig Dispense Refill  . butalbital-acetaminophen-caffeine (FIORICET, ESGIC) 50-325-40 MG per tablet Take 1 tablet by mouth every 6 (six) hours as needed for headache.  100 tablet  3  . cefdinir  (OMNICEF) 300 MG capsule Take 1 capsule (300 mg total) by mouth 2 (two) times daily.  20 capsule  0  . cefdinir (OMNICEF) 300 MG capsule Take 1 capsule (300 mg total) by mouth 2 (two) times daily.  28 capsule  0  . Cholecalciferol 1000 UNITS tablet Take 1,000 Units by mouth daily.        . Flaxseed, Linseed, (FLAX SEED OIL) 1000 MG CAPS Take 1 capsule by mouth daily.        . fluticasone (FLONASE) 50 MCG/ACT nasal spray Place 1 spray into the nose daily.  16 g  11  . nadolol (CORGARD) 40 MG tablet Take 0.5 tablets (20 mg total) by mouth 2 (two) times daily.  180 tablet  3  . tamoxifen (NOLVADEX) 20 MG tablet Take 20 mg by mouth daily.        Marland Kitchen triamcinolone (KENALOG) 0.5 % cream Apply topically 3 (three) times daily.  60 g  0  . DISCONTD: fluticasone (FLONASE) 50 MCG/ACT nasal spray Place 1 spray into the nose daily.  16 g  11  . DISCONTD: nadolol (CORGARD) 40 MG tablet Take 0.5 tablets (20 mg total) by mouth 2 (two) times daily.  180 tablet  3    OBJECTIVE:  Vitals - 1 value per visit 12/09/2011  SYSTOLIC 130  DIASTOLIC 88  PULSE 72  TEMPERATURE 98.2  RESPIRATIONS 16  Weight (lb) 113  HEIGHT   BMI 20.02   Sclerae unicteric Oropharynx clear No cervical or supraclavicular adenopathy Lungs no rales or rhonchi Heart regular rate and rhythm Abd benign MSK no focal spinal tenderness, no peripheral edema Neuro: nonfocal Breasts: Right breast is unremarkable. Left breast is status post lumpectomy. There is no evidence of local recurrence.  LAB RESULTS: Lab Results  Component Value Date   WBC 5.8 12/02/2011   NEUTROABS 3.9 12/02/2011   HGB 12.0 12/02/2011   HCT 36.0 12/02/2011   MCV 92.3 12/02/2011   PLT 170 12/02/2011      Chemistry      Component Value Date/Time   NA 142 12/02/2011 1111   K 3.9 12/02/2011 1111   CL 107 12/02/2011 1111   CO2 29 12/02/2011 1111   BUN 10 12/02/2011 1111   CREATININE 0.65 12/02/2011 1111      Component Value Date/Time   CALCIUM 8.7 12/02/2011 1111    ALKPHOS 46 12/02/2011 1111   AST 18 12/02/2011 1111   ALT 13 12/02/2011 1111   BILITOT 0.3 12/02/2011 1111       Lab Results  Component Value Date   LABCA2 21 12/02/2011    No components found with this basename: [id number]    No results found for this basename: INR:1;PROTIME:1 in the last 168 hours  Urinalysis    Component Value Date/Time   COLORURINE YELLOW 12/09/2009 0954   APPEARANCEUR CLEAR 12/09/2009 0954   LABSPEC >=1.030 12/09/2009 0954   PHURINE 5.5 12/09/2009 0954   BILIRUBINUR neg 06/26/2011 1157  BILIRUBINUR NEGATIVE 12/09/2009 0954   KETONESUR NEGATIVE 12/09/2009 0954   UROBILINOGEN negative 06/26/2011 1157   UROBILINOGEN 0.2 12/09/2009 0954   NITRITE neg 06/26/2011 1157   NITRITE NEGATIVE 12/09/2009 0954   LEUKOCYTESUR Negative 06/26/2011 1157    STUDIES: No results found. Most recent mammogram January 2013 was unremarkable.  ASSESSMENT: 61 y.o. [place] woman, status post left lumpectomy in January 2008 for a grade 2 ductal carcinoma in situ, no invasive disease, focally positive margin which was re-excised in February 2008.  Tumor was estrogen receptor positive at 99%, progesterone receptor positive at 38%.  Status post radiation therapy which was completed in January 2008 and since that time has been on tamoxifen, 20 mg daily, with good tolerance.   PLAN: Lindsay Schultz has completed 5 years of tamoxifen for her noninvasive breast cancer. At this point uncomfortable releasing her back to her primary care physician. She understands we would be glad to see her at any point in the future, but no further appointments are being made for her here.   Lindsay Schultz    12/09/2011

## 2011-12-09 NOTE — Assessment & Plan Note (Signed)
F/u app today - finishing Tamoxifen

## 2012-01-19 ENCOUNTER — Encounter: Payer: Self-pay | Admitting: Internal Medicine

## 2012-01-19 ENCOUNTER — Ambulatory Visit (INDEPENDENT_AMBULATORY_CARE_PROVIDER_SITE_OTHER): Payer: 59 | Admitting: Internal Medicine

## 2012-01-19 VITALS — BP 150/82 | HR 72 | Temp 98.5°F | Resp 16 | Wt 113.0 lb

## 2012-01-19 DIAGNOSIS — R51 Headache: Secondary | ICD-10-CM

## 2012-01-19 DIAGNOSIS — Z853 Personal history of malignant neoplasm of breast: Secondary | ICD-10-CM

## 2012-01-19 DIAGNOSIS — G500 Trigeminal neuralgia: Secondary | ICD-10-CM | POA: Insufficient documentation

## 2012-01-19 MED ORDER — GABAPENTIN 100 MG PO CAPS: 100.0000 mg | ORAL_CAPSULE | Freq: Three times a day (TID) | ORAL | Status: DC

## 2012-01-19 MED ORDER — METHYLPREDNISOLONE ACETATE 80 MG/ML IJ SUSP
120.0000 mg | Freq: Once | INTRAMUSCULAR | Status: AC
  Administered 2012-01-19: 120 mg via INTRAMUSCULAR

## 2012-01-19 MED ORDER — ACYCLOVIR 800 MG PO TABS: 800.0000 mg | ORAL_TABLET | Freq: Every day | ORAL | Status: DC

## 2012-01-19 MED ORDER — OXYCODONE-ACETAMINOPHEN 5-325 MG PO TABS: 1.0000 | ORAL_TABLET | Freq: Three times a day (TID) | ORAL | Status: DC | PRN

## 2012-01-19 NOTE — Assessment & Plan Note (Addendum)
10/13 - trigem neuralgia, poss. H zoster Percocet prn Depo-medrol 120 mg IM Acyclovir x 10 d

## 2012-01-19 NOTE — Assessment & Plan Note (Signed)
9/13 - off Tamoxifen now

## 2012-01-19 NOTE — Assessment & Plan Note (Signed)
10/13 - trigem neuralgia, poss. H zoster Percocet prn Depo-medrol 120 mg IM Acyclovir x 10 d 

## 2012-01-19 NOTE — Progress Notes (Signed)
  Subjective:    Patient ID: Lindsay Schultz, female    DOB: [birth year], 61 y.o.   MRN: [id number]  Sinusitis This is a new problem. The current episode started in the past 7 days. There has been no fever. Her pain is at a severity of 8/10. Associated symptoms include headaches and neck pain. Pertinent negatives include no chills, congestion, coughing or sinus pressure. The treatment provided no relief.  Neck Pain  Associated symptoms include headaches. Pertinent negatives include no numbness or weakness.       BP Readings from Last 3 Encounters:  01/19/12 150/82  12/09/11 168/72  12/09/11 130/88   Wt Readings from Last 3 Encounters:  01/19/12 113 lb (51.256 kg)  12/09/11 114 lb (51.71 kg)  12/09/11 113 lb (51.256 kg)       Review of Systems  Constitutional: Positive for fatigue. Negative for chills, activity change, appetite change and unexpected weight change.  HENT: Positive for neck pain. Negative for congestion, mouth sores and sinus pressure.   Eyes: Negative for visual disturbance.  Respiratory: Negative for cough and chest tightness.   Gastrointestinal: Negative for nausea and abdominal pain.  Genitourinary: Negative for frequency, difficulty urinating and vaginal pain.  Musculoskeletal: Negative for back pain and gait problem.  Skin: Negative for pallor and rash.  Neurological: Positive for headaches. Negative for dizziness, tremors, weakness and numbness.  Psychiatric/Behavioral: Positive for disturbed wake/sleep cycle. Negative for confusion.       Objective:   Physical Exam  Constitutional: She appears well-developed. No distress.  HENT:  Head: Normocephalic.  Right Ear: External ear normal.  Left Ear: External ear normal.  Nose: Nose normal.  Mouth/Throat: Oropharynx is clear and moist. No oropharyngeal exudate.  Eyes: Conjunctivae normal are normal. Pupils are equal, round, and reactive to light. Right eye exhibits no discharge. Left eye exhibits no discharge.   Neck: Normal range of motion. Neck supple. No JVD present. No tracheal deviation present. No thyromegaly present.  Cardiovascular: Normal rate, regular rhythm and normal heart sounds.   Pulmonary/Chest: No stridor. No respiratory distress. She has no wheezes.  Abdominal: Soft. Bowel sounds are normal. She exhibits no distension and no mass. There is no tenderness. There is no rebound and no guarding.  Musculoskeletal: She exhibits no edema and no tenderness.  Lymphadenopathy:    She has no cervical adenopathy.  Neurological: She displays normal reflexes. No cranial nerve deficit. She exhibits normal muscle tone. Coordination normal.  Skin: No rash noted. No erythema.  Psychiatric: She has a normal mood and affect. Her behavior is normal. Judgment and thought content normal.  No rash R face and scull - tender Lab Results  Component Value Date   WBC 5.8 12/02/2011   HGB 12.0 12/02/2011   HCT 36.0 12/02/2011   PLT 170 12/02/2011   GLUCOSE 102* 12/02/2011   CHOL 197 06/11/2010   TRIG 207.0* 06/11/2010   HDL 42.10 06/11/2010   LDLDIRECT 127.5 06/11/2010   ALT 13 12/02/2011   AST 18 12/02/2011   NA 142 12/02/2011   K 3.9 12/02/2011   CL 107 12/02/2011   CREATININE 0.65 12/02/2011   BUN 10 12/02/2011   CO2 29 12/02/2011   TSH 1.82 12/09/2009          Assessment & Plan:

## 2012-01-19 NOTE — Patient Instructions (Addendum)
Call if not better please ina few days

## 2012-02-15 ENCOUNTER — Ambulatory Visit (INDEPENDENT_AMBULATORY_CARE_PROVIDER_SITE_OTHER): Payer: 59 | Admitting: *Deleted

## 2012-02-15 DIAGNOSIS — Z23 Encounter for immunization: Secondary | ICD-10-CM

## 2012-02-15 MED ORDER — ZOSTER VACCINE LIVE 19400 UNT/0.65ML ~~LOC~~ SOLR: 0.6500 mL | Freq: Once | SUBCUTANEOUS | Status: DC

## 2012-02-15 NOTE — Addendum Note (Signed)
Addended by: Elnora Morrison on: 02/15/2012 04:52 PM   Modules accepted: Orders

## 2012-03-13 ENCOUNTER — Other Ambulatory Visit: Payer: Self-pay | Admitting: Internal Medicine

## 2012-03-13 ENCOUNTER — Other Ambulatory Visit: Payer: Self-pay | Admitting: Oncology

## 2012-03-13 DIAGNOSIS — Z9889 Other specified postprocedural states: Secondary | ICD-10-CM

## 2012-03-13 DIAGNOSIS — Z853 Personal history of malignant neoplasm of breast: Secondary | ICD-10-CM

## 2012-03-15 ENCOUNTER — Telehealth: Payer: Self-pay | Admitting: Internal Medicine

## 2012-03-15 NOTE — Telephone Encounter (Signed)
OK to fill this prescription with additional refills x3  I called it in Thank you!

## 2012-03-15 NOTE — Telephone Encounter (Signed)
Caller: Lindsay Schultz/Patient; Phone: [phone]; Reason for Call: Unable to get her Fiorcet refilled.  Pharmacy states that they need a new script faxed please.  Uses [facility] in [place] on [address].  She uses the generic for this medication.  States that they asked the pharmacy to f/u on this and they refused.

## 2012-03-17 ENCOUNTER — Encounter: Payer: Self-pay | Admitting: Internal Medicine

## 2012-03-17 ENCOUNTER — Ambulatory Visit (INDEPENDENT_AMBULATORY_CARE_PROVIDER_SITE_OTHER): Payer: 59 | Admitting: Internal Medicine

## 2012-03-17 VITALS — BP 128/86 | HR 62 | Temp 98.2°F | Ht 63.0 in | Wt 110.0 lb

## 2012-03-17 DIAGNOSIS — B029 Zoster without complications: Secondary | ICD-10-CM

## 2012-03-17 MED ORDER — OXYCODONE-ACETAMINOPHEN 5-325 MG PO TABS: 1.0000 | ORAL_TABLET | Freq: Three times a day (TID) | ORAL | Status: DC | PRN

## 2012-03-17 MED ORDER — BUTALBITAL-APAP-CAFFEINE 50-325-40 MG PO TABS: 1.0000 | ORAL_TABLET | Freq: Four times a day (QID) | ORAL | Status: DC | PRN

## 2012-03-17 MED ORDER — ACYCLOVIR 800 MG PO TABS: 800.0000 mg | ORAL_TABLET | Freq: Every day | ORAL | Status: DC

## 2012-03-17 NOTE — Progress Notes (Signed)
Subjective:    Patient ID: Lindsay Schultz, female    DOB: Apr 16, 1951, 61 y.o.   MRN: [id number]  HPI  Pt presents to the clinic today with c/o right hip pain with a rash that started 3 days ago. The pain starts in her right hip and shoots down into her thigh. The pain is 8/10. She describes it as stinging, burning, needle pricks. She has had pain like this before. Last month, she was see and treated for shingles on her faces. She states this feels exactly the same. She has been taking her Neurontin and Tylenol for the pain with only mlld relief.  Review of Systems      Past Medical History  Diagnosis Date  . Allergic rhinitis   . Hx of breast cancer   . GERD (gastroesophageal reflux disease)   . HTN (hypertension)   . Osteopenia     Current Outpatient Prescriptions  Medication Sig Dispense Refill  . acyclovir (ZOVIRAX) 800 MG tablet Take 1 tablet (800 mg total) by mouth 5 (five) times daily.  50 tablet  0  . butalbital-acetaminophen-caffeine (FIORICET, ESGIC) 50-325-40 MG per tablet Take 1 tablet by mouth every 6 (six) hours as needed for headache.  100 tablet  3  . Flaxseed, Linseed, (FLAX SEED OIL) 1000 MG CAPS Take 1 capsule by mouth daily.        . fluticasone (FLONASE) 50 MCG/ACT nasal spray Place 1 spray into the nose daily.  16 g  11  . gabapentin (NEURONTIN) 100 MG capsule Take 1 capsule (100 mg total) by mouth 3 (three) times daily.  90 capsule  3  . Multiple Vitamins-Minerals (MULTIVITAMIN WITH MINERALS) tablet Take 1 tablet by mouth daily.      . nadolol (CORGARD) 40 MG tablet Take 0.5 tablets (20 mg total) by mouth 2 (two) times daily.  180 tablet  3  . oxyCODONE-acetaminophen (ROXICET) 5-325 MG per tablet Take 1 tablet by mouth every 8 (eight) hours as needed for pain.  60 tablet  0    Allergies  Allergen Reactions  . Ciprofloxacin     REACTION: rash along the vein w/IV Cipro  . Clarithromycin     REACTION: rash  . Penicillins     REACTION: passed out after a shot at  61 y.o    Family History  Problem Relation Age of Onset  . Sjogren's syndrome Mother   . Hypertension Other     History   Social History  . Marital Status: Married    Spouse Name: N/A    Number of Children: N/A  . Years of Education: N/A   Occupational History  . Not on file.   Social History Main Topics  . Smoking status: Never Smoker   . Smokeless tobacco: Not on file  . Alcohol Use: No  . Drug Use: No  . Sexually Active: Yes   Other Topics Concern  . Not on file   Social History Narrative   Regular Exercise- yes     Constitutional: Denies fever, malaise, fatigue, headache or abrupt weight changes.  Skin: Pt reports rash on her right buttocks. Denies redness, lesions or ulcerations. Musculoskeletal:  Pt reports hip pain and difficulty with gait. Denies decrease in range of motion, muscle pain or swelling.  Neurological: Denies dizziness, difficulty with memory, difficulty with speech or problems with balance and coordination.   No other specific complaints in a complete review of systems (except as listed in HPI above).  Objective:   Physical  Exam   BP 128/86  Pulse 62  Temp 98.2 F (36.8 C) (Oral)  Ht 5\' 3"  (1.6 m)  Wt 110 lb (49.896 kg)  BMI 19.49 kg/m2  SpO2 97% Wt Readings from Last 3 Encounters:  03/17/12 110 lb (49.896 kg)  01/19/12 113 lb (51.256 kg)  12/09/11 114 lb (51.71 kg)    General: Appears her stated age, well developed, well nourished in NAD. Skin: Erythematous, vesicular rash along the right buttock. Painful to touch.  Cardiovascular: Normal rate and rhythm. S1,S2 noted.  No murmur, rubs or gallops noted. No JVD or BLE edema. No carotid bruits noted. Pulmonary/Chest: Normal effort and positive vesicular breath sounds. No respiratory distress. No wheezes, rales or ronchi noted.  Musculoskeletal: Normal range of motion. No signs of joint swelling. Mild difficulty with gait due to pain.  Neurological: Alert and oriented. Cranial nerves  II-XII intact. Coordination normal. +DTRs bilaterally.       Assessment & Plan:   Hip pain, likely due to shingles, new onset with additional workup required:  Continue taking Neurontin as prescribed eRx given for Acyclovir Refill given for Roxicet After this resolves, come in for your shingles vaccine  RTC as needed or if symptoms persist

## 2012-03-17 NOTE — Patient Instructions (Addendum)

## 2012-03-24 ENCOUNTER — Encounter: Payer: Self-pay | Admitting: Internal Medicine

## 2012-03-24 ENCOUNTER — Ambulatory Visit (INDEPENDENT_AMBULATORY_CARE_PROVIDER_SITE_OTHER): Payer: 59 | Admitting: Internal Medicine

## 2012-03-24 VITALS — BP 130/90 | HR 80 | Temp 99.0°F | Resp 16 | Wt 111.0 lb

## 2012-03-24 DIAGNOSIS — R21 Rash and other nonspecific skin eruption: Secondary | ICD-10-CM

## 2012-03-24 DIAGNOSIS — B029 Zoster without complications: Secondary | ICD-10-CM

## 2012-03-24 DIAGNOSIS — M545 Low back pain: Secondary | ICD-10-CM

## 2012-03-24 MED ORDER — TRIAMCINOLONE ACETONIDE 0.5 % EX OINT: TOPICAL_OINTMENT | Freq: Three times a day (TID) | CUTANEOUS | Status: DC

## 2012-03-24 MED ORDER — VALACYCLOVIR HCL 1 G PO TABS: 1000.0000 mg | ORAL_TABLET | Freq: Three times a day (TID) | ORAL | Status: DC

## 2012-03-24 MED ORDER — PREDNISONE 10 MG PO TABS: ORAL_TABLET | ORAL | Status: DC

## 2012-03-24 MED ORDER — METHYLPREDNISOLONE ACETATE 80 MG/ML IJ SUSP
120.0000 mg | Freq: Once | INTRAMUSCULAR | Status: AC
  Administered 2012-03-24: 120 mg via INTRAMUSCULAR

## 2012-03-24 MED ORDER — TRAMADOL HCL 50 MG PO TABS: 50.0000 mg | ORAL_TABLET | Freq: Four times a day (QID) | ORAL | Status: DC | PRN

## 2012-03-27 ENCOUNTER — Encounter: Payer: Self-pay | Admitting: Internal Medicine

## 2012-03-27 DIAGNOSIS — M545 Low back pain, unspecified: Secondary | ICD-10-CM | POA: Insufficient documentation

## 2012-03-27 DIAGNOSIS — B029 Zoster without complications: Secondary | ICD-10-CM | POA: Insufficient documentation

## 2012-03-27 NOTE — Assessment & Plan Note (Signed)
Depomedrol IM Prednisone 10 mg: take 4 tabs a day x 3 days; then 3 tabs a day x 4 days; then 2 tabs a day x 4 days, then 1 tab a day x 6 days, then stop. Take pc. Tramadol prn Gabaprntin prn

## 2012-03-27 NOTE — Progress Notes (Signed)
Subjective:    Patient ID: Lindsay Schultz, female    DOB: [birth year], 61 y.o.   MRN: [id number]  Rash Associated symptoms include fatigue. Pertinent negatives include no cough.    Pt presents to the clinic today with c/o right hip pain with a rash that started 1 wk ago. The pain starts in her right hip and shoots down into her thigh. The pain is 8/10. She describes it as stinging, burning, needle pricks. She has had pain like this before. Last month, she was see and treated for shingles on her faces. She states this feels exactly the same. She has been taking her Neurontin and Tylenol for the pain with only mlld relief. She states she never had a Zostavax  Review of Systems  Constitutional: Positive for activity change and fatigue. Negative for chills.  HENT: Negative for sinus pressure.   Eyes: Negative for redness and visual disturbance.  Respiratory: Negative for cough.   Musculoskeletal: Positive for back pain. Negative for joint swelling.  Skin: Positive for rash.  Neurological: Negative for dizziness, weakness and light-headedness.  Psychiatric/Behavioral: Negative for suicidal ideas. The patient is not nervous/anxious.         Past Medical History  Diagnosis Date  . Allergic rhinitis   . Hx of breast cancer   . GERD (gastroesophageal reflux disease)   . HTN (hypertension)   . Osteopenia     Current Outpatient Prescriptions  Medication Sig Dispense Refill  . butalbital-acetaminophen-caffeine (FIORICET, ESGIC) 50-325-40 MG per tablet Take 1 tablet by mouth every 6 (six) hours as needed for headache.  100 tablet  3  . Flaxseed, Linseed, (FLAX SEED OIL) 1000 MG CAPS Take 1 capsule by mouth daily.        . fluticasone (FLONASE) 50 MCG/ACT nasal spray Place 1 spray into the nose daily.  16 g  11  . Multiple Vitamins-Minerals (MULTIVITAMIN WITH MINERALS) tablet Take 1 tablet by mouth daily.      . nadolol (CORGARD) 40 MG tablet Take 0.5 tablets (20 mg total) by mouth 2 (two)  times daily.  180 tablet  3  . oxyCODONE-acetaminophen (ROXICET) 5-325 MG per tablet Take 1 tablet by mouth every 8 (eight) hours as needed for pain.  60 tablet  0  . gabapentin (NEURONTIN) 100 MG capsule Take 1 capsule (100 mg total) by mouth 3 (three) times daily.  90 capsule  3  . predniSONE (DELTASONE) 10 MG tablet Prednisone 10 mg: take 4 tabs a day x 3 days; then 3 tabs a day x 4 days; then 2 tabs a day x 4 days, then 1 tab a day x 6 days, then stop. Take pc.  38 tablet  1  . traMADol (ULTRAM) 50 MG tablet Take 1 tablet (50 mg total) by mouth every 6 (six) hours as needed for pain.  100 tablet  2  . triamcinolone ointment (KENALOG) 0.5 % Apply topically 3 (three) times daily.  120 g  1  . valACYclovir (VALTREX) 1000 MG tablet Take 1 tablet (1,000 mg total) by mouth 3 (three) times daily.  30 tablet  0    Allergies  Allergen Reactions  . Ciprofloxacin     REACTION: rash along the vein w/IV Cipro  . Clarithromycin     REACTION: rash  . Penicillins     REACTION: passed out after a shot at 61 y.o    Family History  Problem Relation Age of Onset  . Sjogren's syndrome Mother   . Hypertension  Other     History   Social History  . Marital Status: Married    Spouse Name: N/A    Number of Children: N/A  . Years of Education: N/A   Occupational History  . Not on file.   Social History Main Topics  . Smoking status: Never Smoker   . Smokeless tobacco: Not on file  . Alcohol Use: No  . Drug Use: No  . Sexually Active: Yes   Other Topics Concern  . Not on file   Social History Narrative   Regular Exercise- yes       Objective:   Physical Exam  Constitutional: She appears well-developed. No distress.       Tired  HENT:  Head: Normocephalic.  Right Ear: External ear normal.  Left Ear: External ear normal.  Nose: Nose normal.  Mouth/Throat: Oropharynx is clear and moist.  Eyes: Conjunctivae normal are normal. Pupils are equal, round, and reactive to light. Right  eye exhibits no discharge. Left eye exhibits no discharge.  Neck: Normal range of motion. Neck supple. No JVD present. No tracheal deviation present. No thyromegaly present.  Cardiovascular: Normal rate, regular rhythm and normal heart sounds.   Pulmonary/Chest: No stridor. No respiratory distress. She has no wheezes.  Abdominal: Soft. Bowel sounds are normal. She exhibits no distension and no mass. There is no tenderness. There is no rebound and no guarding.  Musculoskeletal: She exhibits no edema and no tenderness.  Lymphadenopathy:    She has no cervical adenopathy.  Neurological: She displays normal reflexes. No cranial nerve deficit. She exhibits normal muscle tone. Coordination normal.  Skin: Rash noted. No erythema.       Extensive vesicula rash on R LS, buttock, R thigh, RLQ  Psychiatric: She has a normal mood and affect. Her behavior is normal. Judgment and thought content normal.           Assessment & Plan:

## 2012-03-27 NOTE — Assessment & Plan Note (Signed)
Valtrex if not much better by monday

## 2012-04-17 ENCOUNTER — Ambulatory Visit (INDEPENDENT_AMBULATORY_CARE_PROVIDER_SITE_OTHER): Payer: 59 | Admitting: Internal Medicine

## 2012-04-17 ENCOUNTER — Encounter: Payer: Self-pay | Admitting: Internal Medicine

## 2012-04-17 VITALS — BP 120/72 | HR 76 | Temp 97.1°F | Resp 16 | Wt 109.0 lb

## 2012-04-17 DIAGNOSIS — R21 Rash and other nonspecific skin eruption: Secondary | ICD-10-CM

## 2012-04-17 DIAGNOSIS — B029 Zoster without complications: Secondary | ICD-10-CM

## 2012-04-17 DIAGNOSIS — M545 Low back pain: Secondary | ICD-10-CM

## 2012-04-17 MED ORDER — NADOLOL 40 MG PO TABS: 20.0000 mg | ORAL_TABLET | Freq: Two times a day (BID) | ORAL | Status: DC

## 2012-04-17 NOTE — Assessment & Plan Note (Signed)
Resolved

## 2012-04-17 NOTE — Assessment & Plan Note (Signed)
Better - see Rx

## 2012-04-17 NOTE — Assessment & Plan Note (Signed)
Better  

## 2012-04-17 NOTE — Progress Notes (Signed)
Patient ID: Lindsay Schultz, female   DOB: [birth year], 61 y.o.   MRN: [id number]   Subjective:    Patient ID: Lindsay Schultz, female    DOB: June 17, 1950, 61 y.o.   MRN: [id number]  Rash Associated symptoms include fatigue. Pertinent negatives include no cough.    Pt presents to the clinic today with c/o right hip pain with a rash that started 1 mo ago. The pain starts in her right hip and shoots down into her thigh. The pain is 5/10. She describes it as stinging, burning, needle pricks. She has had pain like this before. Last month, she was see and treated for shingles on her faces. She states this feels exactly the same. She has been taking her Neurontin and Tylenol for the pain with only mlld relief. She states she never had a Zostavax  Review of Systems  Constitutional: Positive for activity change and fatigue. Negative for chills.  HENT: Negative for sinus pressure.   Eyes: Negative for redness and visual disturbance.  Respiratory: Negative for cough.   Musculoskeletal: Positive for back pain. Negative for joint swelling.  Skin: Positive for rash.  Neurological: Negative for dizziness, weakness and light-headedness.  Psychiatric/Behavioral: Negative for suicidal ideas. The patient is not nervous/anxious.         Past Medical History  Diagnosis Date  . Allergic rhinitis   . Hx of breast cancer   . GERD (gastroesophageal reflux disease)   . HTN (hypertension)   . Osteopenia     Current Outpatient Prescriptions  Medication Sig Dispense Refill  . butalbital-acetaminophen-caffeine (FIORICET, ESGIC) 50-325-40 MG per tablet Take 1 tablet by mouth every 6 (six) hours as needed for headache.  100 tablet  3  . Flaxseed, Linseed, (FLAX SEED OIL) 1000 MG CAPS Take 1 capsule by mouth daily.        . fluticasone (FLONASE) 50 MCG/ACT nasal spray Place 1 spray into the nose daily.  16 g  11  . gabapentin (NEURONTIN) 100 MG capsule Take 1 capsule (100 mg total) by mouth 3 (three) times daily.  90  capsule  3  . Multiple Vitamins-Minerals (MULTIVITAMIN WITH MINERALS) tablet Take 1 tablet by mouth daily.      . nadolol (CORGARD) 40 MG tablet Take 0.5 tablets (20 mg total) by mouth 2 (two) times daily.  180 tablet  3  . oxyCODONE-acetaminophen (ROXICET) 5-325 MG per tablet Take 1 tablet by mouth every 8 (eight) hours as needed for pain.  60 tablet  0  . predniSONE (DELTASONE) 10 MG tablet Prednisone 10 mg: take 4 tabs a day x 3 days; then 3 tabs a day x 4 days; then 2 tabs a day x 4 days, then 1 tab a day x 6 days, then stop. Take pc.  38 tablet  1  . traMADol (ULTRAM) 50 MG tablet Take 1 tablet (50 mg total) by mouth every 6 (six) hours as needed for pain.  100 tablet  2  . triamcinolone ointment (KENALOG) 0.5 % Apply topically 3 (three) times daily.  120 g  1  . valACYclovir (VALTREX) 1000 MG tablet Take 1 tablet (1,000 mg total) by mouth 3 (three) times daily.  30 tablet  0    Allergies  Allergen Reactions  . Ciprofloxacin     REACTION: rash along the vein w/IV Cipro  . Clarithromycin     REACTION: rash  . Penicillins     REACTION: passed out after a shot at 62 y.o  Family History  Problem Relation Age of Onset  . Sjogren's syndrome Mother   . Hypertension Other     History   Social History  . Marital Status: Married    Spouse Name: N/A    Number of Children: N/A  . Years of Education: N/A   Occupational History  . Not on file.   Social History Main Topics  . Smoking status: Never Smoker   . Smokeless tobacco: Not on file  . Alcohol Use: No  . Drug Use: No  . Sexually Active: Yes   Other Topics Concern  . Not on file   Social History Narrative   Regular Exercise- yes       Objective:   Physical Exam  Constitutional: She appears well-developed. No distress.       Tired  HENT:  Head: Normocephalic.  Right Ear: External ear normal.  Left Ear: External ear normal.  Nose: Nose normal.  Mouth/Throat: Oropharynx is clear and moist.  Eyes: Conjunctivae  normal are normal. Pupils are equal, round, and reactive to light. Right eye exhibits no discharge. Left eye exhibits no discharge.  Neck: Normal range of motion. Neck supple. No JVD present. No tracheal deviation present. No thyromegaly present.  Cardiovascular: Normal rate, regular rhythm and normal heart sounds.   Pulmonary/Chest: No stridor. No respiratory distress. She has no wheezes.  Abdominal: Soft. Bowel sounds are normal. She exhibits no distension and no mass. There is no tenderness. There is no rebound and no guarding.  Musculoskeletal: She exhibits no edema and no tenderness.  Lymphadenopathy:    She has no cervical adenopathy.  Neurological: She displays normal reflexes. No cranial nerve deficit. She exhibits normal muscle tone. Coordination normal.  Skin: Rash noted. No erythema.       Extensive vesicula rash on R LS, buttock, R thigh, RLQ  Psychiatric: She has a normal mood and affect. Her behavior is normal. Judgment and thought content normal.           Assessment & Plan:

## 2012-04-28 ENCOUNTER — Encounter: Payer: Self-pay | Admitting: Internal Medicine

## 2012-04-28 ENCOUNTER — Telehealth: Payer: Self-pay | Admitting: Internal Medicine

## 2012-04-28 ENCOUNTER — Ambulatory Visit (INDEPENDENT_AMBULATORY_CARE_PROVIDER_SITE_OTHER): Payer: 59 | Admitting: Internal Medicine

## 2012-04-28 VITALS — BP 112/82 | HR 72 | Temp 99.2°F

## 2012-04-28 DIAGNOSIS — J019 Acute sinusitis, unspecified: Secondary | ICD-10-CM

## 2012-04-28 MED ORDER — HYDROCODONE-HOMATROPINE 5-1.5 MG/5ML PO SYRP: 5.0000 mL | ORAL_SOLUTION | Freq: Four times a day (QID) | ORAL | Status: DC | PRN

## 2012-04-28 MED ORDER — CEFDINIR 300 MG PO CAPS: 300.0000 mg | ORAL_CAPSULE | Freq: Two times a day (BID) | ORAL | Status: DC

## 2012-04-28 MED ORDER — PROMETHAZINE-PHENYLEPHRINE 6.25-5 MG/5ML PO SYRP: 5.0000 mL | ORAL_SOLUTION | ORAL | Status: DC | PRN

## 2012-04-28 NOTE — Patient Instructions (Addendum)
It was good to see you today. Omnicef antibiotics and prescription decongestant syrup - Your prescription(s) have been given to you to submit to your pharmacy. Please take as directed and contact our office if you believe you are having problem(s) with the medication(s). Alternate between ibuprofen and tylenol for aches, pain and fever symptoms as discussed Hydrate, rest and call if worse or unimprovedSinusitis Sinusitis is redness, soreness, and swelling (inflammation) of the paranasal sinuses. Paranasal sinuses are air pockets within the bones of your face (beneath the eyes, the middle of the forehead, or above the eyes). In healthy paranasal sinuses, mucus is able to drain out, and air is able to circulate through them by Bauch of your nose. However, when your paranasal sinuses are inflamed, mucus and air can become trapped. This can allow bacteria and other germs to grow and cause infection. Sinusitis can develop quickly and last only a short time (acute) or continue over a long period (chronic). Sinusitis that lasts for more than 12 weeks is considered chronic.   CAUSES   Causes of sinusitis include:  Allergies.   Structural abnormalities, such as displacement of the cartilage that separates your nostrils (deviated septum), which can decrease the air flow through your nose and sinuses and affect sinus drainage.   Functional abnormalities, such as when the small hairs (cilia) that line your sinuses and help remove mucus do not work properly or are not present.  SYMPTOMS   Symptoms of acute and chronic sinusitis are the same. The primary symptoms are pain and pressure around the affected sinuses. Other symptoms include:  Upper toothache.   Earache.   Headache.   Bad breath.   Decreased sense of smell and taste.   A cough, which worsens when you are lying flat.   Fatigue.   Fever.   Thick drainage from your nose, which often is green and may contain pus (purulent).   Swelling and  warmth over the affected sinuses.  DIAGNOSIS   Your caregiver will perform a physical exam. During the exam, your caregiver may:  Look in your nose for signs of abnormal growths in your nostrils (nasal polyps).   Tap over the affected sinus to check for signs of infection.   View the inside of your sinuses (endoscopy) with a special imaging device with a light attached (endoscope), which is inserted into your sinuses.  If your caregiver suspects that you have chronic sinusitis, one or more of the following tests may be recommended:  Allergy tests.   Nasal culture A sample of mucus is taken from your nose and sent to a lab and screened for bacteria.   Nasal cytology A sample of mucus is taken from your nose and examined by your caregiver to determine if your sinusitis is related to an allergy.  TREATMENT   Most cases of acute sinusitis are related to a viral infection and will resolve on their own within 10 days. Sometimes medicines are prescribed to help relieve symptoms (pain medicine, decongestants, nasal steroid sprays, or saline sprays).   However, for sinusitis related to a bacterial infection, your caregiver will prescribe antibiotic medicines. These are medicines that will help kill the bacteria causing the infection.   Rarely, sinusitis is caused by a fungal infection. In theses cases, your caregiver will prescribe antifungal medicine. For some cases of chronic sinusitis, surgery is needed. Generally, these are cases in which sinusitis recurs more than 3 times per year, despite other treatments. HOME CARE INSTRUCTIONS    Drink  plenty of water. Water helps thin the mucus so your sinuses can drain more easily.   Use a humidifier.   Inhale steam 3 to 4 times a day (for example, sit in the bathroom with the shower running).   Apply a warm, moist washcloth to your face 3 to 4 times a day, or as directed by your caregiver.   Use saline nasal sprays to help moisten and clean your  sinuses.   Take over-the-counter or prescription medicines for pain, discomfort, or fever only as directed by your caregiver.  SEEK IMMEDIATE MEDICAL CARE IF:  You have increasing pain or severe headaches.   You have nausea, vomiting, or drowsiness.   You have swelling around your face.   You have vision problems.   You have a stiff neck.   You have difficulty breathing.  MAKE SURE YOU:    Understand these instructions.   Will watch your condition.   Will get help right away if you are not doing well or get worse.  Document Released: 04/05/2005 Document Revised: 06/28/2011 Document Reviewed: 04/20/2011 [facility] Patient Information 2013 [place], [place].

## 2012-04-28 NOTE — Telephone Encounter (Signed)
Pharmacy called again.  Please call Vonna Kotyk at [phone] [facility].

## 2012-04-28 NOTE — Telephone Encounter (Signed)
Notified Vonna Kotyk with md response...lmb

## 2012-04-28 NOTE — Telephone Encounter (Signed)
Pt was seen in the office today (04-28-12) by Dr Felicity Coyer for Sinusitis.  Pt was prescribed Cefdinir and Promethazine - Phenylephrine 6.25 - 5 MG/5 ML.  Pt has the antibiotic filled but states that there is not a pharmacy that she find that has the cough medication.  OFFICE PLEASE FOLLOW UP WITH PATIENT AND SEE IF DR LESCHBER CAN CALL ANOTHER COUGH SYRUP IN FOR PATIENT.  PT USES [facility] [phone].

## 2012-04-28 NOTE — Progress Notes (Signed)
  Subjective:   HPI  complains of head cold symptoms, ?sinusitus Onset >1 week ago, initially improved then relapsing and worse symptoms  First associated with rhinorrhea, sneezing, sore throat, mild headache and low grade fever Now sinus pressure and mild-mod nasal congestion, yellow-green discharge No relief with OTC meds Precipitated by sick contacts and weather change Hx sinusitis requiring surgical intervention 1990s - fewer episodes since then  Past Medical History  Diagnosis Date  . Allergic rhinitis   . Hx of breast cancer   . GERD (gastroesophageal reflux disease)   . HTN (hypertension)   . Osteopenia     Review of Systems Constitutional: No night sweats, no unexpected weight change Pulmonary: No pleurisy or hemoptysis Cardiovascular: No chest pain or palpitations     Objective:   Physical Exam BP 112/82  Pulse 72  Temp 99.2 F (37.3 C) (Oral)  SpO2 96% GEN: mildly ill appearing and audible head/chest congestion HENT: NCAT, mild sinus tenderness bilaterally max>frontal, nares with thick discharge and mild turbinate swelling, oropharynx mild erythema and PND, no exudate Eyes: Vision grossly intact, no conjunctivitis Lungs: Clear to auscultation without rhonchi or wheeze, no increased work of breathing Cardiovascular: Regular rate and rhythm, no bilateral edema  Lab Results  Component Value Date   WBC 5.8 12/02/2011   HGB 12.0 12/02/2011   HCT 36.0 12/02/2011   PLT 170 12/02/2011   GLUCOSE 102* 12/02/2011   CHOL 197 06/11/2010   TRIG 207.0* 06/11/2010   HDL 42.10 06/11/2010   LDLDIRECT 127.5 06/11/2010   ALT 13 12/02/2011   AST 18 12/02/2011   NA 142 12/02/2011   K 3.9 12/02/2011   CL 107 12/02/2011   CREATININE 0.65 12/02/2011   BUN 10 12/02/2011   CO2 29 12/02/2011   TSH 1.82 12/09/2009      Assessment & Plan:  Viral URI > progression to acute sinusitis Cough, postnasal drip related to above  Empiric antibiotics prescribed due to prior surgical sinus problems  and symptom duration greater than 5 days and progression despite OTC symptomatic care Prescription cough/decongestant syrup - new prescriptions done Symptomatic care with Tylenol or Advil, decongestants, antihistamine, hydration and rest -  Saline irrigation and salt gargle advised as needed

## 2012-04-28 NOTE — Telephone Encounter (Signed)
Change to hydromet - please phone in

## 2012-05-22 ENCOUNTER — Ambulatory Visit
Admission: RE | Admit: 2012-05-22 | Discharge: 2012-05-22 | Disposition: A | Discharge status: Home / Self Care | Payer: 59 | Source: Ambulatory Visit | Attending: Internal Medicine | Admitting: Internal Medicine

## 2012-05-22 DIAGNOSIS — Z9889 Other specified postprocedural states: Secondary | ICD-10-CM

## 2012-05-22 DIAGNOSIS — Z853 Personal history of malignant neoplasm of breast: Secondary | ICD-10-CM

## 2012-06-07 ENCOUNTER — Encounter: Payer: Self-pay | Admitting: Internal Medicine

## 2012-06-07 ENCOUNTER — Ambulatory Visit (INDEPENDENT_AMBULATORY_CARE_PROVIDER_SITE_OTHER): Payer: 59 | Admitting: Internal Medicine

## 2012-06-07 VITALS — BP 140/92 | HR 83 | Temp 100.7°F

## 2012-06-07 DIAGNOSIS — J0100 Acute maxillary sinusitis, unspecified: Secondary | ICD-10-CM

## 2012-06-07 MED ORDER — CEFDINIR 300 MG PO CAPS: 300.0000 mg | ORAL_CAPSULE | Freq: Two times a day (BID) | ORAL | Status: AC

## 2012-06-07 NOTE — Patient Instructions (Addendum)
It was good to see you today. Omnicef antibiotics - Your prescription(s) have been given to you to submit to your pharmacy. Please take as directed and contact our office if you believe you are having problem(s) with the medication(s). Continue zyrtec and Flonase Alternate between ibuprofen and tylenol for aches, pain and fever symptoms as discussed we'll make referral to Dr Pollyann Kennedy (ENT) . Our office will contact you regarding appointment(s) once made. Test(s) ordered today. CT sinus - Our office will contact you regarding appointment(s) once made. Your results will be released to MyChart (or called to you) after review, usually within 72hours after test completion. If any changes need to be made, you will be notified at that same time.   Sinusitis Sinusitis is redness, soreness, and swelling (inflammation) of the paranasal sinuses. Paranasal sinuses are air pockets within the bones of your face (beneath the eyes, the middle of the forehead, or above the eyes). In healthy paranasal sinuses, mucus is able to drain out, and air is able to circulate through them by Sybert of your nose. However, when your paranasal sinuses are inflamed, mucus and air can become trapped. This can allow bacteria and other germs to grow and cause infection. Sinusitis can develop quickly and last only a short time (acute) or continue over a long period (chronic). Sinusitis that lasts for more than 12 weeks is considered chronic.   CAUSES   Causes of sinusitis include:  Allergies.   Structural abnormalities, such as displacement of the cartilage that separates your nostrils (deviated septum), which can decrease the air flow through your nose and sinuses and affect sinus drainage.   Functional abnormalities, such as when the small hairs (cilia) that line your sinuses and help remove mucus do not work properly or are not present.  SYMPTOMS   Symptoms of acute and chronic sinusitis are the same. The primary symptoms are pain and  pressure around the affected sinuses. Other symptoms include:  Upper toothache.   Earache.   Headache.   Bad breath.   Decreased sense of smell and taste.   A cough, which worsens when you are lying flat.   Fatigue.   Fever.   Thick drainage from your nose, which often is green and may contain pus (purulent).   Swelling and warmth over the affected sinuses.  DIAGNOSIS   Your caregiver will perform a physical exam. During the exam, your caregiver may:  Look in your nose for signs of abnormal growths in your nostrils (nasal polyps).   Tap over the affected sinus to check for signs of infection.   View the inside of your sinuses (endoscopy) with a special imaging device with a light attached (endoscope), which is inserted into your sinuses.  If your caregiver suspects that you have chronic sinusitis, one or more of the following tests may be recommended:  Allergy tests.   Nasal culture A sample of mucus is taken from your nose and sent to a lab and screened for bacteria.   Nasal cytology A sample of mucus is taken from your nose and examined by your caregiver to determine if your sinusitis is related to an allergy.  TREATMENT   Most cases of acute sinusitis are related to a viral infection and will resolve on their own within 10 days. Sometimes medicines are prescribed to help relieve symptoms (pain medicine, decongestants, nasal steroid sprays, or saline sprays).   However, for sinusitis related to a bacterial infection, your caregiver will prescribe antibiotic medicines. These are medicines  that will help kill the bacteria causing the infection.   Rarely, sinusitis is caused by a fungal infection. In theses cases, your caregiver will prescribe antifungal medicine. For some cases of chronic sinusitis, surgery is needed. Generally, these are cases in which sinusitis recurs more than 3 times per year, despite other treatments. HOME CARE INSTRUCTIONS    Drink plenty of water.  Water helps thin the mucus so your sinuses can drain more easily.   Use a humidifier.   Inhale steam 3 to 4 times a day (for example, sit in the bathroom with the shower running).   Apply a warm, moist washcloth to your face 3 to 4 times a day, or as directed by your caregiver.   Use saline nasal sprays to help moisten and clean your sinuses.   Take over-the-counter or prescription medicines for pain, discomfort, or fever only as directed by your caregiver.  SEEK IMMEDIATE MEDICAL CARE IF:  You have increasing pain or severe headaches.   You have nausea, vomiting, or drowsiness.   You have swelling around your face.   You have vision problems.   You have a stiff neck.   You have difficulty breathing.  MAKE SURE YOU:    Understand these instructions.   Will watch your condition.   Will get help right away if you are not doing well or get worse.  Document Released: 04/05/2005 Document Revised: 06/28/2011 Document Reviewed: 04/20/2011 [facility] Patient Information 2013 [place], [place].

## 2012-06-07 NOTE — Progress Notes (Signed)
  Subjective:   HPI  complains of head cold symptoms, ?sinusitus Onset >1 week ago, initially improved then relapsing and worse symptoms  First associated with rhinorrhea, sneezing, sore throat, mild headache and low grade fever Now sinus pressure and mild-mod nasal congestion, yellow-green discharge No relief with OTC meds Precipitated by sick contacts and weather change Hx sinusitis requiring surgical intervention 1990s - fewer episodes since then  Past Medical History  Diagnosis Date  . Allergic rhinitis   . Hx of breast cancer   . GERD (gastroesophageal reflux disease)   . HTN (hypertension)   . Osteopenia     Review of Systems Constitutional: No night sweats, no unexpected weight change Pulmonary: No pleurisy or hemoptysis Cardiovascular: No chest pain or palpitations     Objective:   Physical Exam BP 140/92  Pulse 83  Temp(Src) 100.7 F (38.2 C) (Oral)  SpO2 99% GEN: mildly ill appearing and audible head/chest congestion HENT: NCAT, mild sinus tenderness bilaterally max>frontal, nares with thick discharge and mild turbinate swelling, oropharynx mild erythema and PND, no exudate Eyes: Vision grossly intact, no conjunctivitis Lungs: Clear to auscultation without rhonchi or wheeze, no increased work of breathing Cardiovascular: Regular rate and rhythm, no bilateral edema  Lab Results  Component Value Date   WBC 5.8 12/02/2011   HGB 12.0 12/02/2011   HCT 36.0 12/02/2011   PLT 170 12/02/2011   GLUCOSE 102* 12/02/2011   CHOL 197 06/11/2010   TRIG 207.0* 06/11/2010   HDL 42.10 06/11/2010   LDLDIRECT 127.5 06/11/2010   ALT 13 12/02/2011   AST 18 12/02/2011   NA 142 12/02/2011   K 3.9 12/02/2011   CL 107 12/02/2011   CREATININE 0.65 12/02/2011   BUN 10 12/02/2011   CO2 29 12/02/2011   TSH 1.82 12/09/2009      Assessment & Plan:  acute sinusitis, L max - recurrent  Empiric antibiotics prescribed due to fever, prior surgical sinus problems and symptom duration greater than  10 days and progression despite OTC symptomatic care Refer to ENT and check CT sinus Symptomatic care with Tylenol or Advil, decongestants, antihistamine, hydration and rest -  Saline irrigation and salt gargle advised as needed

## 2012-06-09 ENCOUNTER — Other Ambulatory Visit: Payer: 59

## 2012-06-15 ENCOUNTER — Ambulatory Visit: Payer: 59 | Admitting: Internal Medicine

## 2012-06-20 ENCOUNTER — Other Ambulatory Visit: Payer: 59

## 2012-06-21 ENCOUNTER — Ambulatory Visit (INDEPENDENT_AMBULATORY_CARE_PROVIDER_SITE_OTHER)
Admission: RE | Admit: 2012-06-21 | Discharge: 2012-06-21 | Disposition: A | Discharge status: Home / Self Care | Payer: 59 | Source: Ambulatory Visit | Attending: Internal Medicine | Admitting: Internal Medicine

## 2012-06-21 DIAGNOSIS — J0100 Acute maxillary sinusitis, unspecified: Secondary | ICD-10-CM

## 2012-08-15 ENCOUNTER — Ambulatory Visit: Payer: 59 | Admitting: Internal Medicine

## 2012-08-22 ENCOUNTER — Other Ambulatory Visit (INDEPENDENT_AMBULATORY_CARE_PROVIDER_SITE_OTHER): Payer: 59

## 2012-08-22 ENCOUNTER — Encounter: Payer: Self-pay | Admitting: Internal Medicine

## 2012-08-22 ENCOUNTER — Ambulatory Visit (INDEPENDENT_AMBULATORY_CARE_PROVIDER_SITE_OTHER): Payer: 59 | Admitting: Internal Medicine

## 2012-08-22 VITALS — BP 140/80 | HR 72 | Temp 98.3°F | Resp 16 | Wt 111.0 lb

## 2012-08-22 DIAGNOSIS — R209 Unspecified disturbances of skin sensation: Secondary | ICD-10-CM

## 2012-08-22 DIAGNOSIS — B029 Zoster without complications: Secondary | ICD-10-CM

## 2012-08-22 DIAGNOSIS — I10 Essential (primary) hypertension: Secondary | ICD-10-CM

## 2012-08-22 DIAGNOSIS — M8580 Other specified disorders of bone density and structure, unspecified site: Secondary | ICD-10-CM

## 2012-08-22 DIAGNOSIS — M79609 Pain in unspecified limb: Secondary | ICD-10-CM

## 2012-08-22 DIAGNOSIS — M899 Disorder of bone, unspecified: Secondary | ICD-10-CM

## 2012-08-22 DIAGNOSIS — R202 Paresthesia of skin: Secondary | ICD-10-CM | POA: Insufficient documentation

## 2012-08-22 DIAGNOSIS — M949 Disorder of cartilage, unspecified: Secondary | ICD-10-CM

## 2012-08-22 LAB — CBC WITH DIFFERENTIAL/PLATELET
Basophils Absolute: 0 10*3/uL (ref 0.0–0.1)
Eosinophils Relative: 1.2 % (ref 0.0–5.0)
Hemoglobin: 13.2 g/dL (ref 12.0–15.0)
Lymphocytes Relative: 23.5 % (ref 12.0–46.0)
Monocytes Relative: 7.4 % (ref 3.0–12.0)
Neutro Abs: 5.3 10*3/uL (ref 1.4–7.7)
RBC: 4.42 Mil/uL (ref 3.87–5.11)
RDW: 13.2 % (ref 11.5–14.6)
WBC: 7.8 10*3/uL (ref 4.5–10.5)

## 2012-08-22 MED ORDER — TRAMADOL HCL 50 MG PO TABS: 50.0000 mg | ORAL_TABLET | Freq: Four times a day (QID) | ORAL | Status: DC | PRN

## 2012-08-22 MED ORDER — PREGABALIN 150 MG PO CAPS: 150.0000 mg | ORAL_CAPSULE | Freq: Two times a day (BID) | ORAL | Status: DC

## 2012-08-22 MED ORDER — MELOXICAM 15 MG PO TABS: 15.0000 mg | ORAL_TABLET | Freq: Every day | ORAL | Status: DC | PRN

## 2012-08-22 NOTE — Assessment & Plan Note (Signed)
Continue with current prescription therapy as reflected on the Med list.  

## 2012-08-22 NOTE — Progress Notes (Signed)
   Subjective:    HPI  F/u HTN C/o burning pains in hands and feet x 1-2 years- worse. Gabapentin did not help. Tylenol would dull a little. Hands are cold. C/o hand stiffness   BP Readings from Last 3 Encounters:  08/22/12 140/80  06/07/12 140/92  04/28/12 112/82   Wt Readings from Last 3 Encounters:  08/22/12 111 lb (50.349 kg)  04/17/12 109 lb (49.442 kg)  03/24/12 111 lb (50.349 kg)       Review of Systems  Constitutional: Negative for chills, activity change, appetite change, fatigue and unexpected weight change.  HENT: Negative for congestion, mouth sores and sinus pressure.   Eyes: Negative for visual disturbance.  Respiratory: Negative for cough and chest tightness.   Gastrointestinal: Negative for nausea and abdominal pain.  Genitourinary: Negative for frequency, difficulty urinating and vaginal pain.  Musculoskeletal: Negative for back pain and gait problem.  Skin: Negative for pallor and rash.  Neurological: Negative for dizziness, tremors, weakness, numbness and headaches.  Psychiatric/Behavioral: Negative for confusion and sleep disturbance.       Objective:   Physical Exam  Constitutional: She appears well-developed. No distress.  HENT:  Head: Normocephalic.  Right Ear: External ear normal.  Left Ear: External ear normal.  Nose: Nose normal.  Mouth/Throat: Oropharynx is clear and moist. No oropharyngeal exudate.  Eyes: Conjunctivae are normal. Pupils are equal, round, and reactive to light. Right eye exhibits no discharge. Left eye exhibits no discharge.  Neck: Normal range of motion. Neck supple. No JVD present. No tracheal deviation present. No thyromegaly present.  Cardiovascular: Normal rate, regular rhythm and normal heart sounds.   Pulmonary/Chest: No stridor. No respiratory distress. She has no wheezes.  Abdominal: Soft. Bowel sounds are normal. She exhibits no distension and no mass. There is no tenderness. There is no rebound and no guarding.   Musculoskeletal: She exhibits no edema and no tenderness.  Lymphadenopathy:    She has no cervical adenopathy.  Neurological: She displays normal reflexes. No cranial nerve deficit. She exhibits normal muscle tone. Coordination normal.  Skin: No rash noted. No erythema.  Psychiatric: She has a normal mood and affect. Her behavior is normal. Judgment and thought content normal.   Lab Results  Component Value Date   WBC 5.8 12/02/2011   HGB 12.0 12/02/2011   HCT 36.0 12/02/2011   PLT 170 12/02/2011   GLUCOSE 102* 12/02/2011   CHOL 197 06/11/2010   TRIG 207.0* 06/11/2010   HDL 42.10 06/11/2010   LDLDIRECT 127.5 06/11/2010   ALT 13 12/02/2011   AST 18 12/02/2011   NA 142 12/02/2011   K 3.9 12/02/2011   CL 107 12/02/2011   CREATININE 0.65 12/02/2011   BUN 10 12/02/2011   CO2 29 12/02/2011   TSH 1.82 12/09/2009          Assessment & Plan:

## 2012-08-22 NOTE — Assessment & Plan Note (Signed)
Vit D 

## 2012-08-22 NOTE — Assessment & Plan Note (Signed)
5/14 hands and feet Labs Tramadol, Meloxicam We can try Lyrica

## 2012-08-22 NOTE — Assessment & Plan Note (Signed)
resolved 

## 2012-08-23 ENCOUNTER — Other Ambulatory Visit: Payer: Self-pay | Admitting: Internal Medicine

## 2012-08-23 DIAGNOSIS — E538 Deficiency of other specified B group vitamins: Secondary | ICD-10-CM

## 2012-08-23 LAB — TSH: TSH: 1.45 u[IU]/mL (ref 0.35–5.50)

## 2012-08-23 LAB — BASIC METABOLIC PANEL
Calcium: 9.4 mg/dL (ref 8.4–10.5)
GFR: 105.86 mL/min (ref >60.00)
Glucose, Bld: 77 mg/dL (ref 70–99)
Sodium: 140 mEq/L (ref 135–145)

## 2012-08-23 LAB — HEPATIC FUNCTION PANEL: Total Bilirubin: 0.5 mg/dL (ref 0.3–1.2)

## 2012-08-23 LAB — SEDIMENTATION RATE: Sed Rate: 27 mm/hr — ABNORMAL HIGH (ref 0–22)

## 2012-08-23 LAB — VITAMIN D 25 HYDROXY (VIT D DEFICIENCY, FRACTURES): Vit D, 25-Hydroxy: 46 ng/mL (ref 30–89)

## 2012-08-23 LAB — RHEUMATOID FACTOR: Rhuematoid fact SerPl-aCnc: <10 IU/mL (ref <=14)

## 2012-08-23 MED ORDER — VITAMIN B-12 500 MCG SL SUBL: SUBLINGUAL_TABLET | SUBLINGUAL | Status: AC

## 2012-08-23 NOTE — Assessment & Plan Note (Signed)
5/14 borderline low B12 Start SL B12

## 2012-08-24 LAB — PROTEIN ELECTROPHORESIS, SERUM
Alpha-1-Globulin: 7.7 % — ABNORMAL HIGH (ref 2.9–4.9)
Gamma Globulin: 10.8 % — ABNORMAL LOW (ref 11.1–18.8)

## 2012-08-25 ENCOUNTER — Telehealth: Payer: Self-pay

## 2012-08-25 NOTE — Telephone Encounter (Signed)
Phone call from pt requesting her lab results letting her know the results of the Sjogren's test: one was negative and the other borderline. She does not want an appt at this time to see a rheumatologist.

## 2012-09-26 ENCOUNTER — Encounter: Payer: Self-pay | Admitting: Internal Medicine

## 2012-09-26 ENCOUNTER — Ambulatory Visit (INDEPENDENT_AMBULATORY_CARE_PROVIDER_SITE_OTHER): Payer: 59 | Admitting: Internal Medicine

## 2012-09-26 VITALS — BP 130/80 | HR 80 | Temp 98.4°F | Resp 16 | Wt 111.0 lb

## 2012-09-26 DIAGNOSIS — M79671 Pain in right foot: Secondary | ICD-10-CM

## 2012-09-26 DIAGNOSIS — M79642 Pain in left hand: Secondary | ICD-10-CM | POA: Insufficient documentation

## 2012-09-26 DIAGNOSIS — E538 Deficiency of other specified B group vitamins: Secondary | ICD-10-CM

## 2012-09-26 DIAGNOSIS — M79672 Pain in left foot: Secondary | ICD-10-CM | POA: Insufficient documentation

## 2012-09-26 DIAGNOSIS — R209 Unspecified disturbances of skin sensation: Secondary | ICD-10-CM

## 2012-09-26 DIAGNOSIS — M79609 Pain in unspecified limb: Secondary | ICD-10-CM

## 2012-09-26 DIAGNOSIS — Z23 Encounter for immunization: Secondary | ICD-10-CM

## 2012-09-26 DIAGNOSIS — R202 Paresthesia of skin: Secondary | ICD-10-CM

## 2012-09-26 DIAGNOSIS — M79641 Pain in right hand: Secondary | ICD-10-CM

## 2012-09-26 MED ORDER — METHYLPREDNISOLONE ACETATE 80 MG/ML IJ SUSP
80.0000 mg | Freq: Once | INTRAMUSCULAR | Status: AC
  Administered 2012-09-26: 80 mg via INTRAMUSCULAR

## 2012-09-26 NOTE — Assessment & Plan Note (Signed)
Continue with current prescription therapy as reflected on the Med list.  

## 2012-09-26 NOTE — Assessment & Plan Note (Signed)
Possible inflammatory arthritis ?cause Rheum ref  Depomedrol 80 mg IM

## 2012-09-26 NOTE — Assessment & Plan Note (Signed)
Possible inflammatory arthritis ?cause Rheum ref  Depomedrol 80 mg IM 

## 2012-09-26 NOTE — Progress Notes (Signed)
   Subjective:    HPI  F/u HTN F/u burning pains in hands and feet x 1-2 years- worse. Gabapentin did not help. Tylenol would dull a little. Hands are cold. C/o hand stiffness and pain - worse in am...   BP Readings from Last 3 Encounters:  09/26/12 130/80  08/22/12 140/80  06/07/12 140/92   Wt Readings from Last 3 Encounters:  09/26/12 111 lb (50.349 kg)  08/22/12 111 lb (50.349 kg)  04/17/12 109 lb (49.442 kg)       Review of Systems  Constitutional: Negative for chills, activity change, appetite change, fatigue and unexpected weight change.  HENT: Negative for congestion, mouth sores and sinus pressure.   Eyes: Negative for visual disturbance.  Respiratory: Negative for cough and chest tightness.   Gastrointestinal: Negative for nausea and abdominal pain.  Genitourinary: Negative for frequency, difficulty urinating and vaginal pain.  Musculoskeletal: Negative for back pain and gait problem.  Skin: Negative for pallor and rash.  Neurological: Negative for dizziness, tremors, weakness, numbness and headaches.  Psychiatric/Behavioral: Negative for confusion and sleep disturbance.       Objective:   Physical Exam  Constitutional: She appears well-developed. No distress.  HENT:  Head: Normocephalic.  Right Ear: External ear normal.  Left Ear: External ear normal.  Nose: Nose normal.  Mouth/Throat: Oropharynx is clear and moist. No oropharyngeal exudate.  Eyes: Conjunctivae are normal. Pupils are equal, round, and reactive to light. Right eye exhibits no discharge. Left eye exhibits no discharge.  Neck: Normal range of motion. Neck supple. No JVD present. No tracheal deviation present. No thyromegaly present.  Cardiovascular: Normal rate, regular rhythm and normal heart sounds.   Pulmonary/Chest: No stridor. No respiratory distress. She has no wheezes.  Abdominal: Soft. Bowel sounds are normal. She exhibits no distension and no mass. There is no tenderness. There is no  rebound and no guarding.  Musculoskeletal: She exhibits no edema and no tenderness.  Lymphadenopathy:    She has no cervical adenopathy.  Neurological: She displays normal reflexes. No cranial nerve deficit. She exhibits normal muscle tone. Coordination normal.  Skin: No rash noted. No erythema.  Psychiatric: She has a normal mood and affect. Her behavior is normal. Judgment and thought content normal.   Lab Results  Component Value Date   WBC 7.8 08/22/2012   HGB 13.2 08/22/2012   HCT 38.5 08/22/2012   PLT 170.0 08/22/2012   GLUCOSE 77 08/22/2012   CHOL 197 06/11/2010   TRIG 207.0* 06/11/2010   HDL 42.10 06/11/2010   LDLDIRECT 127.5 06/11/2010   ALT 16 08/22/2012   AST 21 08/22/2012   NA 140 08/22/2012   K 3.6 08/22/2012   CL 104 08/22/2012   CREATININE 0.6 08/22/2012   BUN 8 08/22/2012   CO2 29 08/22/2012   TSH 1.45 08/22/2012          Assessment & Plan:

## 2012-11-22 ENCOUNTER — Ambulatory Visit: Payer: 59 | Admitting: Internal Medicine

## 2012-11-27 ENCOUNTER — Other Ambulatory Visit: Payer: Self-pay | Admitting: Internal Medicine

## 2012-11-27 DIAGNOSIS — Z9889 Other specified postprocedural states: Secondary | ICD-10-CM

## 2012-11-27 DIAGNOSIS — Z853 Personal history of malignant neoplasm of breast: Secondary | ICD-10-CM

## 2013-01-04 ENCOUNTER — Ambulatory Visit (INDEPENDENT_AMBULATORY_CARE_PROVIDER_SITE_OTHER): Payer: 59 | Admitting: Internal Medicine

## 2013-01-04 ENCOUNTER — Encounter: Payer: Self-pay | Admitting: Internal Medicine

## 2013-01-04 VITALS — BP 178/90 | HR 60 | Temp 97.5°F | Resp 12 | Wt 107.0 lb

## 2013-01-04 DIAGNOSIS — J020 Streptococcal pharyngitis: Secondary | ICD-10-CM

## 2013-01-04 DIAGNOSIS — J029 Acute pharyngitis, unspecified: Secondary | ICD-10-CM

## 2013-01-04 DIAGNOSIS — I10 Essential (primary) hypertension: Secondary | ICD-10-CM

## 2013-01-04 LAB — POCT RAPID STREP A (OFFICE): Rapid Strep A Screen: NEGATIVE

## 2013-01-04 MED ORDER — NADOLOL 40 MG PO TABS: 20.0000 mg | ORAL_TABLET | Freq: Two times a day (BID) | ORAL | Status: DC

## 2013-01-04 NOTE — Progress Notes (Signed)
   Subjective:    Sore Throat  This is a new problem. The current episode started in the past 7 days (3 d). The problem has been unchanged. Neither side of throat is experiencing more pain than the other. There has been no fever. The pain is at a severity of 7/10. The pain is severe. Associated symptoms include coughing. Pertinent negatives include no abdominal pain, congestion, drooling, ear discharge, headaches or shortness of breath. She has had no exposure to strep. Exposure to: grandchild. She has tried acetaminophen and gargles for the symptoms. The treatment provided mild relief.    F/u HTN F/u burning pains in hands and feet x 1-2 years- worse. Gabapentin did not help. Tylenol would dull a little.   BP Readings from Last 3 Encounters:  01/04/13 178/90  09/26/12 130/80  08/22/12 140/80   Wt Readings from Last 3 Encounters:  01/04/13 107 lb (48.535 kg)  09/26/12 111 lb (50.349 kg)  08/22/12 111 lb (50.349 kg)       Review of Systems  Constitutional: Negative for chills, activity change, appetite change, fatigue and unexpected weight change.  HENT: Negative for congestion, drooling, mouth sores, sinus pressure and ear discharge.   Eyes: Negative for visual disturbance.  Respiratory: Positive for cough. Negative for chest tightness and shortness of breath.   Gastrointestinal: Negative for nausea and abdominal pain.  Genitourinary: Negative for frequency, difficulty urinating and vaginal pain.  Musculoskeletal: Negative for back pain and gait problem.  Skin: Negative for pallor and rash.  Neurological: Negative for dizziness, tremors, weakness, numbness and headaches.  Psychiatric/Behavioral: Negative for confusion and sleep disturbance.       Objective:   Physical Exam  Constitutional: She appears well-developed. No distress.  HENT:  Head: Normocephalic.  Right Ear: External ear normal.  Left Ear: External ear normal.  Nose: Nose normal.  Mouth/Throat: Oropharynx is  clear and moist. No oropharyngeal exudate.  Eyes: Conjunctivae are normal. Pupils are equal, round, and reactive to light. Right eye exhibits no discharge. Left eye exhibits no discharge.  Neck: Normal range of motion. Neck supple. No JVD present. No tracheal deviation present. No thyromegaly present.  Cardiovascular: Normal rate, regular rhythm and normal heart sounds.   Pulmonary/Chest: No stridor. No respiratory distress. She has no wheezes.  Abdominal: Soft. Bowel sounds are normal. She exhibits no distension and no mass. There is no tenderness. There is no rebound and no guarding.  Musculoskeletal: She exhibits no edema and no tenderness.  Lymphadenopathy:    She has no cervical adenopathy.  Neurological: She displays normal reflexes. No cranial nerve deficit. She exhibits normal muscle tone. Coordination normal.  Skin: No rash noted. No erythema.  Psychiatric: She has a normal mood and affect. Her behavior is normal. Judgment and thought content normal.   Lab Results  Component Value Date   WBC 7.8 08/22/2012   HGB 13.2 08/22/2012   HCT 38.5 08/22/2012   PLT 170.0 08/22/2012   GLUCOSE 77 08/22/2012   CHOL 197 06/11/2010   TRIG 207.0* 06/11/2010   HDL 42.10 06/11/2010   LDLDIRECT 127.5 06/11/2010   ALT 16 08/22/2012   AST 21 08/22/2012   NA 140 08/22/2012   K 3.6 08/22/2012   CL 104 08/22/2012   CREATININE 0.6 08/22/2012   BUN 8 08/22/2012   CO2 29 08/22/2012   TSH 1.45 08/22/2012     Sterp test (-)     Assessment & Plan:

## 2013-01-04 NOTE — Assessment & Plan Note (Signed)
BP is up due to pain, sleepless night Discussed Call if elev BP at home

## 2013-01-04 NOTE — Assessment & Plan Note (Addendum)
9/14 viral Strep test (-) Tramadol prn Call if not better or if worse

## 2013-01-04 NOTE — Patient Instructions (Addendum)
Use over-the-counter  "cold" medicines  such as  "Afrin" nasal spray for nasal congestion as directed instead. Use" Delsym" or" Robitussin" cough syrup varietis for cough.  You can use plain "Tylenol" or "Advi"l for fever, chills and achyness.   "Common cold" symptoms are usually triggered by a virus.  The antibiotics are usually not necessary. On average, a" viral cold" illness would take 4-7 days to resolve. Please, make an appointment if you are not better or if you're worse.  

## 2013-01-05 ENCOUNTER — Telehealth: Payer: Self-pay | Admitting: *Deleted

## 2013-01-05 MED ORDER — MAGIC MOUTHWASH W/LIDOCAINE: 5.0000 mL | Freq: Four times a day (QID) | ORAL | Status: DC | PRN

## 2013-01-05 NOTE — Telephone Encounter (Signed)
Spoke with pt advised Rx sent to pharmacy. 

## 2013-01-05 NOTE — Telephone Encounter (Signed)
[facility] needs the ingredients ratios for magic mouthwash. Please advise.

## 2013-01-05 NOTE — Telephone Encounter (Signed)
Pt called requesting an Rx for magic mouthwash.  Please advise

## 2013-01-05 NOTE — Telephone Encounter (Signed)
Done. Thx.

## 2013-01-09 NOTE — Telephone Encounter (Signed)
Please use the most common ratio Thx

## 2013-01-09 NOTE — Telephone Encounter (Signed)
Pharmacy informed.

## 2013-02-23 ENCOUNTER — Ambulatory Visit (INDEPENDENT_AMBULATORY_CARE_PROVIDER_SITE_OTHER): Payer: 59 | Admitting: *Deleted

## 2013-02-23 DIAGNOSIS — Z23 Encounter for immunization: Secondary | ICD-10-CM

## 2013-02-27 ENCOUNTER — Ambulatory Visit: Payer: 59

## 2013-05-28 ENCOUNTER — Ambulatory Visit
Admission: RE | Admit: 2013-05-28 | Discharge: 2013-05-28 | Disposition: A | Discharge status: Home / Self Care | Payer: Self-pay | Source: Ambulatory Visit | Attending: Internal Medicine | Admitting: Internal Medicine

## 2013-05-28 DIAGNOSIS — Z9889 Other specified postprocedural states: Secondary | ICD-10-CM

## 2013-05-28 DIAGNOSIS — Z853 Personal history of malignant neoplasm of breast: Secondary | ICD-10-CM

## 2013-08-06 ENCOUNTER — Encounter: Payer: Self-pay | Admitting: Internal Medicine

## 2013-08-06 ENCOUNTER — Ambulatory Visit (INDEPENDENT_AMBULATORY_CARE_PROVIDER_SITE_OTHER): Payer: 59 | Admitting: Internal Medicine

## 2013-08-06 VITALS — BP 150/78 | HR 76 | Temp 98.8°F | Resp 16 | Wt 111.0 lb

## 2013-08-06 DIAGNOSIS — J45909 Unspecified asthma, uncomplicated: Secondary | ICD-10-CM

## 2013-08-06 DIAGNOSIS — I10 Essential (primary) hypertension: Secondary | ICD-10-CM

## 2013-08-06 MED ORDER — PROMETHAZINE-CODEINE 6.25-10 MG/5ML PO SYRP: 5.0000 mL | ORAL_SOLUTION | ORAL | Status: DC | PRN

## 2013-08-06 MED ORDER — FLUTICASONE FUROATE-VILANTEROL 100-25 MCG/INH IN AEPB: 1.0000 | INHALATION_SPRAY | Freq: Every day | RESPIRATORY_TRACT | Status: DC

## 2013-08-06 MED ORDER — METHYLPREDNISOLONE ACETATE 80 MG/ML IJ SUSP
80.0000 mg | Freq: Once | INTRAMUSCULAR | Status: AC
  Administered 2013-08-06: 80 mg via INTRAMUSCULAR

## 2013-08-06 MED ORDER — AZITHROMYCIN 250 MG PO TABS: ORAL_TABLET | ORAL | Status: DC

## 2013-08-06 NOTE — Progress Notes (Signed)
Pre visit review using our clinic review tool, if applicable. No additional management support is needed unless otherwise documented below in the visit note. 

## 2013-08-06 NOTE — Assessment & Plan Note (Signed)
Continue with current prescription therapy as reflected on the Med list.  

## 2013-08-06 NOTE — Assessment & Plan Note (Signed)
Depomedrol 80 mg Prom-cod Zpac if worse Breo qd

## 2013-08-06 NOTE — Progress Notes (Signed)
Subjective:    Sore Throat  This is a new problem. The current episode started 1 to 4 weeks ago (3 d). The problem has been unchanged. Neither side of throat is experiencing more pain than the other. There has been no fever. The pain is at a severity of 3/10. The pain is mild. Associated symptoms include coughing. Pertinent negatives include no abdominal pain, congestion, drooling, ear discharge, headaches or shortness of breath. She has had no exposure to strep. Exposure to: grandchild. She has tried acetaminophen and gargles for the symptoms. The treatment provided no relief.  Coughing a lot - can't sleep  F/u HTN F/u burning pains in hands and feet x 1-2 years- worse. Gabapentin did not help. Tylenol would dull a little.   BP Readings from Last 3 Encounters:  08/06/13 150/78  01/04/13 178/90  09/26/12 130/80   Wt Readings from Last 3 Encounters:  08/06/13 111 lb (50.349 kg)  01/04/13 107 lb (48.535 kg)  09/26/12 111 lb (50.349 kg)       Review of Systems  Constitutional: Negative for chills, activity change, appetite change, fatigue and unexpected weight change.  HENT: Negative for congestion, drooling, ear discharge, mouth sores and sinus pressure.   Eyes: Negative for visual disturbance.  Respiratory: Positive for cough. Negative for chest tightness and shortness of breath.   Gastrointestinal: Negative for nausea and abdominal pain.  Genitourinary: Negative for frequency, difficulty urinating and vaginal pain.  Musculoskeletal: Negative for back pain and gait problem.  Skin: Negative for pallor and rash.  Neurological: Negative for dizziness, tremors, weakness, numbness and headaches.  Psychiatric/Behavioral: Negative for confusion and sleep disturbance.       Objective:   Physical Exam  Constitutional: She appears well-developed. No distress.  HENT:  Head: Normocephalic.  Right Ear: External ear normal.  Left Ear: External ear normal.  Nose: Nose normal.   Mouth/Throat: Oropharynx is clear and moist. No oropharyngeal exudate.  Eyes: Conjunctivae are normal. Pupils are equal, round, and reactive to light. Right eye exhibits no discharge. Left eye exhibits no discharge.  Neck: Normal range of motion. Neck supple. No JVD present. No tracheal deviation present. No thyromegaly present.  Cardiovascular: Normal rate, regular rhythm and normal heart sounds.   Pulmonary/Chest: No stridor. No respiratory distress. She has no wheezes.  Abdominal: Soft. Bowel sounds are normal. She exhibits no distension and no mass. There is no tenderness. There is no rebound and no guarding.  Musculoskeletal: She exhibits no edema and no tenderness.  Lymphadenopathy:    She has no cervical adenopathy.  Neurological: She displays normal reflexes. No cranial nerve deficit. She exhibits normal muscle tone. Coordination normal.  Skin: No rash noted. No erythema.  Psychiatric: She has a normal mood and affect. Her behavior is normal. Judgment and thought content normal.   Lab Results  Component Value Date   WBC 7.8 08/22/2012   HGB 13.2 08/22/2012   HCT 38.5 08/22/2012   PLT 170.0 08/22/2012   GLUCOSE 77 08/22/2012   CHOL 197 06/11/2010   TRIG 207.0* 06/11/2010   HDL 42.10 06/11/2010   LDLDIRECT 127.5 06/11/2010   ALT 16 08/22/2012   AST 21 08/22/2012   NA 140 08/22/2012   K 3.6 08/22/2012   CL 104 08/22/2012   CREATININE 0.6 08/22/2012   BUN 8 08/22/2012   CO2 29 08/22/2012   TSH 1.45 08/22/2012    I personally provided Breo inhaler use teaching. After the teaching patient was able to demonstrate it's use effectively.  All questions were answered       Assessment & Plan:

## 2013-11-27 ENCOUNTER — Ambulatory Visit (INDEPENDENT_AMBULATORY_CARE_PROVIDER_SITE_OTHER): Payer: 59 | Admitting: Internal Medicine

## 2013-11-27 ENCOUNTER — Encounter: Payer: Self-pay | Admitting: Internal Medicine

## 2013-11-27 VITALS — BP 125/74 | HR 64 | Temp 97.9°F | Wt 107.0 lb

## 2013-11-27 DIAGNOSIS — M79609 Pain in unspecified limb: Secondary | ICD-10-CM

## 2013-11-27 DIAGNOSIS — R202 Paresthesia of skin: Secondary | ICD-10-CM

## 2013-11-27 DIAGNOSIS — R209 Unspecified disturbances of skin sensation: Secondary | ICD-10-CM

## 2013-11-27 DIAGNOSIS — I10 Essential (primary) hypertension: Secondary | ICD-10-CM

## 2013-11-27 DIAGNOSIS — Z0000 Encounter for general adult medical examination without abnormal findings: Secondary | ICD-10-CM

## 2013-11-27 DIAGNOSIS — J4530 Mild persistent asthma, uncomplicated: Secondary | ICD-10-CM

## 2013-11-27 DIAGNOSIS — E538 Deficiency of other specified B group vitamins: Secondary | ICD-10-CM

## 2013-11-27 DIAGNOSIS — J45909 Unspecified asthma, uncomplicated: Secondary | ICD-10-CM

## 2013-11-27 MED ORDER — NORTRIPTYLINE HCL 10 MG PO CAPS: 10.0000 mg | ORAL_CAPSULE | Freq: Every day | ORAL | Status: DC

## 2013-11-27 MED ORDER — BUTALBITAL-APAP-CAFFEINE 50-325-40 MG PO TABS: 1.0000 | ORAL_TABLET | Freq: Four times a day (QID) | ORAL | Status: DC | PRN

## 2013-11-27 MED ORDER — NADOLOL 40 MG PO TABS: 20.0000 mg | ORAL_TABLET | Freq: Two times a day (BID) | ORAL | Status: DC

## 2013-11-27 NOTE — Progress Notes (Signed)
Pre visit review using our clinic review tool, if applicable. No additional management support is needed unless otherwise documented below in the visit note. 

## 2013-11-27 NOTE — Progress Notes (Signed)
   Subjective:    HPI  F/u HTN F/u burning pains in hands and feet x 1-2 years- Gabapentin did not help. Tylenol and Tramadol would dull a little.  ?Schogren's - dry mouth, eyes  BP Readings from Last 3 Encounters:  11/27/13 125/74  08/06/13 150/78  01/04/13 178/90   Wt Readings from Last 3 Encounters:  11/27/13 107 lb (48.535 kg)  08/06/13 111 lb (50.349 kg)  01/04/13 107 lb (48.535 kg)       Review of Systems  Constitutional: Negative for chills, activity change, appetite change, fatigue and unexpected weight change.  HENT: Negative for mouth sores and sinus pressure.   Eyes: Negative for visual disturbance.  Respiratory: Negative for chest tightness.   Gastrointestinal: Negative for nausea.  Genitourinary: Negative for frequency, difficulty urinating and vaginal pain.  Musculoskeletal: Negative for back pain and gait problem.  Skin: Negative for pallor and rash.  Neurological: Negative for dizziness, tremors, weakness and numbness.  Psychiatric/Behavioral: Negative for confusion and sleep disturbance.       Objective:   Physical Exam  Constitutional: She appears well-developed. No distress.  HENT:  Head: Normocephalic.  Right Ear: External ear normal.  Left Ear: External ear normal.  Nose: Nose normal.  Mouth/Throat: Oropharynx is clear and moist. No oropharyngeal exudate.  Eyes: Conjunctivae are normal. Pupils are equal, round, and reactive to light. Right eye exhibits no discharge. Left eye exhibits no discharge.  Neck: Normal range of motion. Neck supple. No JVD present. No tracheal deviation present. No thyromegaly present.  Cardiovascular: Normal rate, regular rhythm and normal heart sounds.   Pulmonary/Chest: No stridor. No respiratory distress. She has no wheezes.  Abdominal: Soft. Bowel sounds are normal. She exhibits no distension and no mass. There is no tenderness. There is no rebound and no guarding.  Musculoskeletal: She exhibits no edema and no  tenderness.  Lymphadenopathy:    She has no cervical adenopathy.  Neurological: She displays normal reflexes. No cranial nerve deficit. She exhibits normal muscle tone. Coordination normal.  Skin: No rash noted. No erythema.  Psychiatric: She has a normal mood and affect. Her behavior is normal. Judgment and thought content normal.   Lab Results  Component Value Date   WBC 7.8 08/22/2012   HGB 13.2 08/22/2012   HCT 38.5 08/22/2012   PLT 170.0 08/22/2012   GLUCOSE 77 08/22/2012   CHOL 197 06/11/2010   TRIG 207.0* 06/11/2010   HDL 42.10 06/11/2010   LDLDIRECT 127.5 06/11/2010   ALT 16 08/22/2012   AST 21 08/22/2012   NA 140 08/22/2012   K 3.6 08/22/2012   CL 104 08/22/2012   CREATININE 0.6 08/22/2012   BUN 8 08/22/2012   CO2 29 08/22/2012   TSH 1.45 08/22/2012         Assessment & Plan:

## 2013-11-27 NOTE — Assessment & Plan Note (Signed)
Try Aleve Declined Nortriptiline

## 2013-11-27 NOTE — Assessment & Plan Note (Signed)
breo qd

## 2013-11-27 NOTE — Assessment & Plan Note (Signed)
Continue with current prescription therapy as reflected on the Med list.  

## 2013-11-28 ENCOUNTER — Telehealth: Payer: Self-pay | Admitting: Internal Medicine

## 2013-11-28 NOTE — Telephone Encounter (Signed)
Relevant patient education mailed to patient.  

## 2013-12-13 ENCOUNTER — Encounter: Payer: Self-pay | Admitting: Gastroenterology

## 2013-12-26 ENCOUNTER — Telehealth: Payer: Self-pay | Admitting: Internal Medicine

## 2013-12-26 NOTE — Telephone Encounter (Signed)
Patients husband states she gets an allergy shot - kenalog - from Dr. Alain Marion?  She was requesting this injection.  Please advise.

## 2013-12-27 ENCOUNTER — Ambulatory Visit (INDEPENDENT_AMBULATORY_CARE_PROVIDER_SITE_OTHER): Payer: 59 | Admitting: *Deleted

## 2013-12-27 DIAGNOSIS — J309 Allergic rhinitis, unspecified: Secondary | ICD-10-CM

## 2013-12-27 MED ORDER — METHYLPREDNISOLONE ACETATE 80 MG/ML IJ SUSP
80.0000 mg | Freq: Once | INTRAMUSCULAR | Status: AC
Start: 2013-12-27 — End: 2013-12-27
  Administered 2013-12-27: 80 mg via INTRAMUSCULAR

## 2013-12-27 NOTE — Telephone Encounter (Signed)
Pt informed/scheduled for today at 3 pm for nurse visit/injection.

## 2013-12-27 NOTE — Telephone Encounter (Signed)
Ok Depo-medrol 80 mg im Thx

## 2014-02-25 ENCOUNTER — Ambulatory Visit (INDEPENDENT_AMBULATORY_CARE_PROVIDER_SITE_OTHER): Payer: 59 | Admitting: *Deleted

## 2014-02-25 ENCOUNTER — Ambulatory Visit: Payer: 59 | Admitting: *Deleted

## 2014-02-25 DIAGNOSIS — Z23 Encounter for immunization: Secondary | ICD-10-CM

## 2014-03-01 ENCOUNTER — Ambulatory Visit: Payer: 59

## 2014-04-30 ENCOUNTER — Other Ambulatory Visit: Payer: Self-pay

## 2014-04-30 DIAGNOSIS — Z853 Personal history of malignant neoplasm of breast: Secondary | ICD-10-CM

## 2014-04-30 DIAGNOSIS — Z1231 Encounter for screening mammogram for malignant neoplasm of breast: Secondary | ICD-10-CM

## 2014-05-28 ENCOUNTER — Other Ambulatory Visit (INDEPENDENT_AMBULATORY_CARE_PROVIDER_SITE_OTHER): Payer: 59

## 2014-05-28 ENCOUNTER — Ambulatory Visit (INDEPENDENT_AMBULATORY_CARE_PROVIDER_SITE_OTHER): Payer: 59 | Admitting: Internal Medicine

## 2014-05-28 ENCOUNTER — Encounter: Payer: Self-pay | Admitting: Internal Medicine

## 2014-05-28 VITALS — BP 160/94 | HR 67 | Temp 98.8°F | Ht 63.0 in | Wt 110.0 lb

## 2014-05-28 DIAGNOSIS — M79609 Pain in unspecified limb: Secondary | ICD-10-CM

## 2014-05-28 DIAGNOSIS — M25512 Pain in left shoulder: Secondary | ICD-10-CM

## 2014-05-28 DIAGNOSIS — Z23 Encounter for immunization: Secondary | ICD-10-CM

## 2014-05-28 DIAGNOSIS — Z0000 Encounter for general adult medical examination without abnormal findings: Secondary | ICD-10-CM

## 2014-05-28 DIAGNOSIS — I10 Essential (primary) hypertension: Secondary | ICD-10-CM

## 2014-05-28 DIAGNOSIS — M545 Low back pain: Secondary | ICD-10-CM

## 2014-05-28 DIAGNOSIS — R202 Paresthesia of skin: Secondary | ICD-10-CM

## 2014-05-28 DIAGNOSIS — M79604 Pain in right leg: Secondary | ICD-10-CM

## 2014-05-28 DIAGNOSIS — M79672 Pain in left foot: Secondary | ICD-10-CM

## 2014-05-28 DIAGNOSIS — E538 Deficiency of other specified B group vitamins: Secondary | ICD-10-CM

## 2014-05-28 DIAGNOSIS — M79671 Pain in right foot: Secondary | ICD-10-CM

## 2014-05-28 LAB — CBC WITH DIFFERENTIAL/PLATELET
Basophils Absolute: 0 10*3/uL (ref 0.0–0.1)
Basophils Relative: 0.6 % (ref 0.0–3.0)
EOS ABS: 0.1 10*3/uL (ref 0.0–0.7)
EOS PCT: 2.3 % (ref 0.0–5.0)
HEMATOCRIT: 36.2 % (ref 36.0–46.0)
Hemoglobin: 12.4 g/dL (ref 12.0–15.0)
Lymphocytes Relative: 26.6 % (ref 12.0–46.0)
Lymphs Abs: 1.5 10*3/uL (ref 0.7–4.0)
MCHC: 34.2 g/dL (ref 30.0–36.0)
MCV: 87.4 fl (ref 78.0–100.0)
Monocytes Absolute: 0.5 10*3/uL (ref 0.1–1.0)
Monocytes Relative: 8.2 % (ref 3.0–12.0)
NEUTROS PCT: 62.3 % (ref 43.0–77.0)
Neutro Abs: 3.6 10*3/uL (ref 1.4–7.7)
PLATELETS: 184 10*3/uL (ref 150.0–400.0)
RBC: 4.14 Mil/uL (ref 3.87–5.11)
RDW: 13.2 % (ref 11.5–15.5)
WBC: 5.8 10*3/uL (ref 4.0–10.5)

## 2014-05-28 LAB — HEPATIC FUNCTION PANEL
ALK PHOS: 92 U/L (ref 39–117)
ALT: 12 U/L (ref 0–35)
AST: 18 U/L (ref 0–37)
Albumin: 4.2 g/dL (ref 3.5–5.2)
BILIRUBIN TOTAL: 0.3 mg/dL (ref 0.2–1.2)
Bilirubin, Direct: 0.1 mg/dL (ref 0.0–0.3)
Total Protein: 6.8 g/dL (ref 6.0–8.3)

## 2014-05-28 LAB — LIPID PANEL
CHOL/HDL RATIO: 5
Cholesterol: 233 mg/dL — ABNORMAL HIGH (ref 0–200)
HDL: 51 mg/dL (ref >39.00)
NONHDL: 182
TRIGLYCERIDES: 211 mg/dL — AB (ref 0.0–149.0)
VLDL: 42.2 mg/dL — AB (ref 0.0–40.0)

## 2014-05-28 LAB — BASIC METABOLIC PANEL
BUN: 9 mg/dL (ref 6–23)
CO2: 32 mEq/L (ref 19–32)
Calcium: 9.3 mg/dL (ref 8.4–10.5)
Chloride: 105 mEq/L (ref 96–112)
Creatinine, Ser: 0.68 mg/dL (ref 0.40–1.20)
GFR: 92.85 mL/min (ref >60.00)
Glucose, Bld: 87 mg/dL (ref 70–99)
POTASSIUM: 3.8 meq/L (ref 3.5–5.1)
Sodium: 142 mEq/L (ref 135–145)

## 2014-05-28 LAB — URINALYSIS
Bilirubin Urine: NEGATIVE
HGB URINE DIPSTICK: NEGATIVE
KETONES UR: NEGATIVE
Leukocytes, UA: NEGATIVE
Nitrite: NEGATIVE
Specific Gravity, Urine: 1.015 (ref 1.000–1.030)
Total Protein, Urine: NEGATIVE
URINE GLUCOSE: NEGATIVE
UROBILINOGEN UA: 0.2 (ref 0.0–1.0)
pH: 6.5 (ref 5.0–8.0)

## 2014-05-28 LAB — LDL CHOLESTEROL, DIRECT: Direct LDL: 161 mg/dL

## 2014-05-28 LAB — TSH: TSH: 2.47 u[IU]/mL (ref 0.35–4.50)

## 2014-05-28 LAB — VITAMIN B12: Vitamin B-12: 1051 pg/mL — ABNORMAL HIGH (ref 211–911)

## 2014-05-28 NOTE — Assessment & Plan Note (Addendum)
2/16 L arm ?etiology - post-XRT plexitis, radiculopathy, thor outlet syndrome (??) vs other  x2 years Sports Med ref

## 2014-05-28 NOTE — Assessment & Plan Note (Signed)
Continue with current prescription therapy as reflected on the Med list.  

## 2014-05-28 NOTE — Assessment & Plan Note (Signed)
Resolved

## 2014-05-28 NOTE — Assessment & Plan Note (Signed)
Tylenol prn Tramadol prn

## 2014-05-28 NOTE — Progress Notes (Signed)
Pre visit review using our clinic review tool, if applicable. No additional management support is needed unless otherwise documented below in the visit note. 

## 2014-05-28 NOTE — Progress Notes (Signed)
   Subjective:    HPI  F/u HTN F/u burning pains in hands and feet x 1-2 years- Gabapentin did not help. Tylenol and Tramadol would dull a little.   ?Schogren's - dry mouth, eyes H/o frozen shoulder L  C/o L arm pain, cold feeling ?etiology   BP Readings from Last 3 Encounters:  05/28/14 160/94  11/27/13 125/74  08/06/13 150/78   Wt Readings from Last 3 Encounters:  05/28/14 110 lb (49.896 kg)  11/27/13 107 lb (48.535 kg)  08/06/13 111 lb (50.349 kg)       Review of Systems  Constitutional: Negative for chills, activity change, appetite change, fatigue and unexpected weight change.  HENT: Negative for mouth sores and sinus pressure.   Eyes: Negative for visual disturbance.  Respiratory: Negative for chest tightness.   Gastrointestinal: Negative for nausea.  Genitourinary: Negative for frequency, difficulty urinating and vaginal pain.  Musculoskeletal: Negative for back pain and gait problem.  Skin: Negative for pallor and rash.  Neurological: Negative for dizziness, tremors, weakness and numbness.  Psychiatric/Behavioral: Negative for confusion and sleep disturbance.       Objective:   Physical Exam  Constitutional: She appears well-developed. No distress.  HENT:  Head: Normocephalic.  Right Ear: External ear normal.  Left Ear: External ear normal.  Nose: Nose normal.  Mouth/Throat: Oropharynx is clear and moist. No oropharyngeal exudate.  Eyes: Conjunctivae are normal. Pupils are equal, round, and reactive to light. Right eye exhibits no discharge. Left eye exhibits no discharge.  Neck: Normal range of motion. Neck supple. No JVD present. No tracheal deviation present. No thyromegaly present.  Cardiovascular: Normal rate, regular rhythm and normal heart sounds.   Pulmonary/Chest: No stridor. No respiratory distress. She has no wheezes.  Abdominal: Soft. Bowel sounds are normal. She exhibits no distension and no mass. There is no tenderness. There is no rebound  and no guarding.  Musculoskeletal: She exhibits no edema or tenderness.  Lymphadenopathy:    She has no cervical adenopathy.  Neurological: She displays normal reflexes. No cranial nerve deficit. She exhibits normal muscle tone. Coordination normal.  Skin: No rash noted. No erythema.  Psychiatric: She has a normal mood and affect. Her behavior is normal. Judgment and thought content normal.  L triceps is tender MS ok  Lab Results  Component Value Date   WBC 7.8 08/22/2012   HGB 13.2 08/22/2012   HCT 38.5 08/22/2012   PLT 170.0 08/22/2012   GLUCOSE 77 08/22/2012   CHOL 197 06/11/2010   TRIG 207.0* 06/11/2010   HDL 42.10 06/11/2010   LDLDIRECT 127.5 06/11/2010   ALT 16 08/22/2012   AST 21 08/22/2012   NA 140 08/22/2012   K 3.6 08/22/2012   CL 104 08/22/2012   CREATININE 0.6 08/22/2012   BUN 8 08/22/2012   CO2 29 08/22/2012   TSH 1.45 08/22/2012         Assessment & Plan:

## 2014-05-28 NOTE — Assessment & Plan Note (Signed)
H/o frozen shoulder L  2/16 L arm ?etiology - post-XRT plexitis, radiculopathy, thor outlet syndrome (??) vs other  x2 years  Dr Tamala Julian

## 2014-05-31 ENCOUNTER — Encounter: Payer: 59 | Admitting: Internal Medicine

## 2014-06-04 ENCOUNTER — Ambulatory Visit (INDEPENDENT_AMBULATORY_CARE_PROVIDER_SITE_OTHER): Payer: 59 | Admitting: Family Medicine

## 2014-06-04 ENCOUNTER — Encounter: Payer: Self-pay | Admitting: Family Medicine

## 2014-06-04 ENCOUNTER — Other Ambulatory Visit (INDEPENDENT_AMBULATORY_CARE_PROVIDER_SITE_OTHER): Payer: 59

## 2014-06-04 ENCOUNTER — Ambulatory Visit
Admission: RE | Admit: 2014-06-04 | Discharge: 2014-06-04 | Disposition: A | Discharge status: Home / Self Care | Payer: 59 | Source: Ambulatory Visit

## 2014-06-04 VITALS — BP 110/76 | HR 67 | Ht 63.0 in | Wt 110.0 lb

## 2014-06-04 DIAGNOSIS — M79609 Pain in unspecified limb: Secondary | ICD-10-CM

## 2014-06-04 DIAGNOSIS — M25512 Pain in left shoulder: Secondary | ICD-10-CM

## 2014-06-04 DIAGNOSIS — R202 Paresthesia of skin: Secondary | ICD-10-CM

## 2014-06-04 DIAGNOSIS — Z853 Personal history of malignant neoplasm of breast: Secondary | ICD-10-CM

## 2014-06-04 DIAGNOSIS — Z1231 Encounter for screening mammogram for malignant neoplasm of breast: Secondary | ICD-10-CM

## 2014-06-04 MED ORDER — GABAPENTIN 100 MG PO CAPS: 100.0000 mg | ORAL_CAPSULE | Freq: Every day | ORAL | Status: DC

## 2014-06-04 NOTE — Progress Notes (Signed)
[facility] [address] [place], [place] [address] Phone: [phone] Subjective:    I'm seeing this patient by the request  of:  Walker Kehr, MD   CC: left shoulder pain  [id number] Lindsay Schultz is a 64 y.o. female coming in with complaint of left shoulder pain. Patient describes the pain is more of a dull aching sensation that has been going on for greater than 2 years. Patient does have a past medical history significant for breast cancer status post radiation. Patient had radiation approximately 6 years ago. Patient states that this pain does radiate down her arm. States that it can be a tightness in her arm. Denies any numbness but sometimes feels that her hand is cold. Patient states that it feels deep and does not feel like bone or even muscular symptoms. Can give her a burning sensation. Rates the severity of pain as 5 out of 10. Still able to do daily activities but can wake her up at night.     Past medical history, social, surgical and family history all reviewed in electronic medical record.   Review of Systems: No headache, visual changes, nausea, vomiting, diarrhea, constipation, dizziness, abdominal pain, skin rash, fevers, chills, night sweats, weight loss, swollen lymph nodes, body aches, joint swelling, muscle aches, chest pain, shortness of breath, mood changes.   Objective Blood pressure 110/76, pulse 67, height 5\' 3"  (1.6 m), weight 110 lb (49.896 kg), SpO2 98 %.  General: No apparent distress alert and oriented x3 mood and affect normal, dressed appropriately.  HEENT: Pupils equal, extraocular movements intact  Respiratory: Patient's speak in full sentences and does not appear short of breath  Cardiovascular: No lower extremity edema, non tender, no erythema  Skin: Warm dry intact with no signs of infection or rash on extremities or on axial skeleton.  Abdomen: Soft nontender  Neuro: Cranial nerves II through XII are intact,  neurovascularly intact in all extremities with 2+ DTRs and 2+ pulses.  Lymph: No lymphadenopathy of posterior or anterior cervical chain or axillae bilaterally.  Gait normal with good balance and coordination.  MSK:  Non tender with full range of motion and good stability and symmetric strength and tone of  elbows, wrist, hip, knee and ankles bilaterally.  Shoulder: left Inspection reveals no abnormalities, atrophy or asymmetry. Palpation is normal with no tenderness over AC joint or bicipital groove. ROM is full in all planes passively. Rotator cuff strength normal throughout. signs of impingement with positive Neer and Hawkin's tests, but negative empty can sign. Speeds and Yergason's tests normal. No labral pathology noted with negative Obrien's, negative clunk and good stability. Normal scapular function observed. No painful arc and no drop arm sign. No apprehension sign  MSK US performed of: left This study was ordered, performed, and interpreted by Charlann Boxer D.O.  Shoulder:   Supraspinatus:  Appears normal on long and transverse views, no bursal bulge seen with shoulder abduction on impingement view. Infraspinatus:  Appears normal on long and transverse views. Subscapularis:  Appears normal on long and transverse views. Teres Minor:  Appears normal on long and transverse views. AC joint:  Capsule undistended, no geyser sign. Glenohumeral Joint:  Appears normal without effusion. Glenoid Labrum:  Intact without visualized tears. Biceps Tendon:  Appears normal on long and transverse views, no fraying of tendon, tendon located in intertubercular groove, no subluxation with shoulder internal or external rotation. No increased power doppler signal.  Impression:Normal shoulder  Impression and Recommendations:     This case required medical decision making of moderate complexity.

## 2014-06-04 NOTE — Patient Instructions (Addendum)
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  Ice 20 minutes 2 times daily. Usually after activity and before bed. Exercises 3 times a week.  Vitamin D 2000 IU daily B12 101mcg daily B6 200mg  daily Turmeric 500mg  twice daily See me again in 3-4 weeks.

## 2014-06-04 NOTE — Progress Notes (Signed)
Pre visit review using our clinic review tool, if applicable. No additional management support is needed unless otherwise documented below in the visit note. 

## 2014-06-04 NOTE — Assessment & Plan Note (Signed)
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 This is likely second due to more of the radial nerve possibly the thoracic outlet as well. I think the patient likely will have some scarring in this area and will take some time to improve. We discussed over-the-counter medicines a can help this nerve be more efficient. In addition of this patient be put on gabapentin at night. I do not feel that other further imaging is necessary at this time but if we do not make any significant progress in 3 weeks then we will have patient get x-rays of the shoulder. We discussed the possibility of icing regimen and patient did work with a Surveyor, minerals today to possibly do some home stretching.

## 2014-06-20 ENCOUNTER — Encounter: Payer: Self-pay | Admitting: Gastroenterology

## 2014-07-02 ENCOUNTER — Encounter: Payer: Self-pay | Admitting: Family Medicine

## 2014-07-02 ENCOUNTER — Ambulatory Visit (INDEPENDENT_AMBULATORY_CARE_PROVIDER_SITE_OTHER): Payer: 59 | Admitting: Family Medicine

## 2014-07-02 VITALS — BP 128/72 | HR 69 | Ht 63.0 in | Wt 109.0 lb

## 2014-07-02 DIAGNOSIS — R202 Paresthesia of skin: Secondary | ICD-10-CM

## 2014-07-02 DIAGNOSIS — M79609 Pain in unspecified limb: Secondary | ICD-10-CM

## 2014-07-02 NOTE — Patient Instructions (Addendum)
Good to see you Continue the icing Continue the exercises especially to the posture exercises Conitnue the vitamins See me in 6 weeks if not perfect.  You are dong great!!!!

## 2014-07-02 NOTE — Progress Notes (Signed)
Pre visit review using our clinic review tool, if applicable. No additional management support is needed unless otherwise documented below in the visit note. 

## 2014-07-02 NOTE — Assessment & Plan Note (Signed)
Patient is doing significant better with conservative therapy. Patient encouraged to continue the postural as well as the home exercises. Patient will continue to see if we can increase the strengthening of the upper back which I think be the most beneficial to try to decrease the amount of scar tissue formation and compression on the brachial plexus. Patient and will come back and see me again in 6 weeks if continuing to have any difficulty. Patient will continue the natural supplementations seem to be working.

## 2014-07-02 NOTE — Progress Notes (Signed)
  [facility] [address] [place], [place] [address] Phone: [phone] Subjective:    CC: left shoulder pain  [id number] Lindsay Schultz is a 64 y.o. female coming in with complaint of left shoulder pain. 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 Patient was started on gabapentin. Patient's ultrasound of her shoulder was fairly unremarkable. Patient states she is 80% better. Patient states that as long as she does not do certain movements such as reaching behind her back she seems to be doing very well. Nothing that is stopping her from daily activities and she is sleeping comfortably at night. Patient did not start the gabapentin. Patient though is doing the natural supplementations.     Past medical history, social, surgical and family history all reviewed in electronic medical record.   Review of Systems: No headache, visual changes, nausea, vomiting, diarrhea, constipation, dizziness, abdominal pain, skin rash, fevers, chills, night sweats, weight loss, swollen lymph nodes, body aches, joint swelling, muscle aches, chest pain, shortness of breath, mood changes.   Objective Blood pressure 128/72, pulse 69, height 5\' 3"  (1.6 m), weight 109 lb (49.442 kg), SpO2 98 %.  General: No apparent distress alert and oriented x3 mood and affect normal, dressed appropriately.  HEENT: Pupils equal, extraocular movements intact  Respiratory: Patient's speak in full sentences and does not appear short of breath  Cardiovascular: No lower extremity edema, non tender, no erythema  Skin: Warm dry intact with no signs of infection or rash on extremities or on axial skeleton.  Abdomen: Soft nontender  Neuro: Cranial nerves II through XII are intact, neurovascularly intact in all extremities with 2+ DTRs and 2+ pulses.  Lymph: No lymphadenopathy of posterior or anterior cervical chain or axillae bilaterally.  Gait normal with good balance and coordination.    MSK:  Non tender with full range of motion and good stability and symmetric strength and tone of  elbows, wrist, hip, knee and ankles bilaterally.  Shoulder: left Inspection reveals no abnormalities, atrophy or asymmetry. Palpation is normal with no tenderness over AC joint or bicipital groove. ROM is full in all planes passively. Rotator cuff strength normal throughout. signs of impingement with positive Neer and Hawkin's tests, but negative empty can sign. Speeds and Yergason's tests normal. No labral pathology noted with negative Obrien's, negative clunk and good stability. Normal scapular function observed. No painful arc and no drop arm sign. No apprehension sign Contralateral shoulder unremarkable.      Impression and Recommendations:     This case required medical decision making of moderate complexity.

## 2014-07-26 ENCOUNTER — Ambulatory Visit (INDEPENDENT_AMBULATORY_CARE_PROVIDER_SITE_OTHER): Payer: 59 | Admitting: Internal Medicine

## 2014-07-26 ENCOUNTER — Encounter: Payer: Self-pay | Admitting: Internal Medicine

## 2014-07-26 VITALS — BP 158/98 | HR 61 | Temp 98.2°F | Wt 110.0 lb

## 2014-07-26 DIAGNOSIS — J301 Allergic rhinitis due to pollen: Secondary | ICD-10-CM | POA: Diagnosis not present

## 2014-07-26 DIAGNOSIS — J0100 Acute maxillary sinusitis, unspecified: Secondary | ICD-10-CM

## 2014-07-26 MED ORDER — AZITHROMYCIN 250 MG PO TABS: ORAL_TABLET | ORAL | Status: DC

## 2014-07-26 MED ORDER — METHYLPREDNISOLONE ACETATE 80 MG/ML IJ SUSP
80.0000 mg | Freq: Once | INTRAMUSCULAR | Status: AC
  Administered 2014-07-26: 80 mg via INTRAMUSCULAR

## 2014-07-26 NOTE — Assessment & Plan Note (Signed)
Depo-medrol 80 mg IM 

## 2014-07-26 NOTE — Progress Notes (Signed)
Pre visit review using our clinic review tool, if applicable. No additional management support is needed unless otherwise documented below in the visit note. 

## 2014-07-26 NOTE — Progress Notes (Signed)
   Subjective:    Sinusitis This is a new problem. The current episode started in the past 7 days. The problem has been gradually worsening since onset. There has been no fever. Associated symptoms include chills, a sore throat and swollen glands. Pertinent negatives include no congestion, coughing, headaches, shortness of breath or sinus pressure.  Sore Throat  Associated symptoms include swollen glands. Pertinent negatives include no abdominal pain, congestion, coughing, drooling, ear discharge, headaches or shortness of breath.  c/o allergies  F/u HTN F/u burning pains in hands and feet x 1-2 years- worse. Gabapentin did not help. Tylenol would dull a little.   BP Readings from Last 3 Encounters:  07/26/14 158/98  07/02/14 128/72  06/04/14 110/76   Wt Readings from Last 3 Encounters:  07/26/14 110 lb (49.896 kg)  07/02/14 109 lb (49.442 kg)  06/04/14 110 lb (49.896 kg)       Review of Systems  Constitutional: Positive for chills. Negative for activity change, appetite change, fatigue and unexpected weight change.  HENT: Positive for sore throat. Negative for congestion, drooling, ear discharge, mouth sores and sinus pressure.   Eyes: Negative for visual disturbance.  Respiratory: Negative for cough, chest tightness and shortness of breath.   Gastrointestinal: Negative for nausea and abdominal pain.  Genitourinary: Negative for frequency, difficulty urinating and vaginal pain.  Musculoskeletal: Negative for back pain and gait problem.  Skin: Negative for pallor and rash.  Neurological: Negative for dizziness, tremors, weakness, numbness and headaches.  Psychiatric/Behavioral: Negative for confusion and sleep disturbance.       Objective:   Physical Exam  Constitutional: She appears well-developed. No distress.  HENT:  Head: Normocephalic.  Right Ear: External ear normal.  Left Ear: External ear normal.  Nose: Nose normal.  Mouth/Throat: Oropharynx is clear and  moist. No oropharyngeal exudate.  Eyes: Conjunctivae are normal. Pupils are equal, round, and reactive to light. Right eye exhibits no discharge. Left eye exhibits no discharge.  Neck: Normal range of motion. Neck supple. No JVD present. No tracheal deviation present. No thyromegaly present.  Cardiovascular: Normal rate, regular rhythm and normal heart sounds.   Pulmonary/Chest: No stridor. No respiratory distress. She has no wheezes.  Abdominal: Soft. Bowel sounds are normal. She exhibits no distension and no mass. There is no tenderness. There is no rebound and no guarding.  Musculoskeletal: She exhibits no edema or tenderness.  Lymphadenopathy:    She has no cervical adenopathy.  Neurological: She displays normal reflexes. No cranial nerve deficit. She exhibits normal muscle tone. Coordination normal.  Skin: No rash noted. No erythema.  Psychiatric: She has a normal mood and affect. Her behavior is normal. Judgment and thought content normal.   Lab Results  Component Value Date   WBC 5.8 05/28/2014   HGB 12.4 05/28/2014   HCT 36.2 05/28/2014   PLT 184.0 05/28/2014   GLUCOSE 87 05/28/2014   CHOL 233* 05/28/2014   TRIG 211.0* 05/28/2014   HDL 51.00 05/28/2014   LDLDIRECT 161.0 05/28/2014   ALT 12 05/28/2014   AST 18 05/28/2014   NA 142 05/28/2014   K 3.8 05/28/2014   CL 105 05/28/2014   CREATININE 0.68 05/28/2014   BUN 9 05/28/2014   CO2 32 05/28/2014   TSH 2.47 05/28/2014           Assessment & Plan:

## 2014-07-26 NOTE — Assessment & Plan Note (Signed)
Zpac 

## 2014-08-13 ENCOUNTER — Ambulatory Visit: Payer: 59 | Admitting: Family Medicine

## 2014-10-23 ENCOUNTER — Ambulatory Visit (INDEPENDENT_AMBULATORY_CARE_PROVIDER_SITE_OTHER): Payer: 59 | Admitting: Internal Medicine

## 2014-10-23 ENCOUNTER — Encounter: Payer: Self-pay | Admitting: Internal Medicine

## 2014-10-23 VITALS — BP 132/72 | HR 55 | Temp 98.1°F | Resp 12 | Ht 63.0 in | Wt 105.1 lb

## 2014-10-23 DIAGNOSIS — I10 Essential (primary) hypertension: Secondary | ICD-10-CM

## 2014-10-23 DIAGNOSIS — J4520 Mild intermittent asthma, uncomplicated: Secondary | ICD-10-CM

## 2014-10-23 DIAGNOSIS — J301 Allergic rhinitis due to pollen: Secondary | ICD-10-CM

## 2014-10-23 MED ORDER — MOMETASONE FURO-FORMOTEROL FUM 100-5 MCG/ACT IN AERO: 2.0000 | INHALATION_SPRAY | Freq: Two times a day (BID) | RESPIRATORY_TRACT | Status: DC

## 2014-10-23 MED ORDER — FLUTICASONE PROPIONATE 50 MCG/ACT NA SUSP: 1.0000 | Freq: Every day | NASAL | Status: DC

## 2014-10-23 MED ORDER — BUTALBITAL-APAP-CAFFEINE 50-325-40 MG PO TABS: 1.0000 | ORAL_TABLET | Freq: Four times a day (QID) | ORAL | Status: DC | PRN

## 2014-10-23 MED ORDER — NADOLOL 40 MG PO TABS: 20.0000 mg | ORAL_TABLET | Freq: Two times a day (BID) | ORAL | Status: DC

## 2014-10-23 MED ORDER — METHYLPREDNISOLONE ACETATE 80 MG/ML IJ SUSP
80.0000 mg | Freq: Once | INTRAMUSCULAR | Status: AC
  Administered 2014-10-23: 80 mg via INTRAMUSCULAR

## 2014-10-23 NOTE — Assessment & Plan Note (Signed)
Depomedrol 80 mg IM Flonase

## 2014-10-23 NOTE — Assessment & Plan Note (Signed)
Nadolol 

## 2014-10-23 NOTE — Progress Notes (Signed)
Pre visit review using our clinic review tool, if applicable. No additional management support is needed unless otherwise documented below in the visit note. 

## 2014-10-23 NOTE — Progress Notes (Signed)
Subjective:  Patient ID: Lindsay Schultz, female    DOB: 1950/11/14  Age: 64 y.o. MRN: [id number]  CC: No chief complaint on file.   HPI Lindsay Schultz presents for SOB x 5 d, congested, itchy throat. Zyrtec did not help... F/u HTN  Outpatient Prescriptions Prior to Visit  Medication Sig Dispense Refill  . Cholecalciferol 2000 UNITS CAPS Take by mouth daily.    . Cyanocobalamin (VITAMIN B-12) 500 MCG SUBL 1 tab sl qd (Patient taking differently: 1,000 mcg daily. ) 100 tablet 11  . Flaxseed, Linseed, (FLAX SEED OIL) 1000 MG CAPS Take 1 capsule by mouth daily.      . Multiple Vitamins-Minerals (MULTIVITAMIN WITH MINERALS) tablet Take 1 tablet by mouth daily.    . Pyridoxine HCl (VITAMIN B-6 PO) Take 1,000 mcg by mouth daily.    . traMADol (ULTRAM) 50 MG tablet Take 1 tablet (50 mg total) by mouth every 6 (six) hours as needed for pain. 100 tablet 2  . butalbital-acetaminophen-caffeine (FIORICET, ESGIC) 50-325-40 MG per tablet Take 1 tablet by mouth every 6 (six) hours as needed for headache. 100 tablet 2  . fluticasone (FLONASE) 50 MCG/ACT nasal spray Place 1 spray into the nose daily. 16 g 11  . Fluticasone Furoate-Vilanterol (BREO ELLIPTA) 100-25 MCG/INH AEPB Inhale 1 Act into the lungs daily. 1 each 5  . nadolol (CORGARD) 40 MG tablet Take 0.5 tablets (20 mg total) by mouth 2 (two) times daily. 60 tablet 11  . azithromycin (ZITHROMAX Z-PAK) 250 MG tablet As directed (Patient not taking: Reported on 10/23/2014) 6 each 0   No facility-administered medications prior to visit.    ROS Review of Systems  Constitutional: Negative for chills, activity change, appetite change, fatigue and unexpected weight change.  HENT: Positive for rhinorrhea. Negative for congestion, mouth sores and sinus pressure.   Eyes: Negative for visual disturbance.  Respiratory: Positive for cough, chest tightness and wheezing.   Cardiovascular: Negative for palpitations and leg swelling.  Gastrointestinal: Negative for  nausea and abdominal pain.  Genitourinary: Negative for dysuria, frequency, difficulty urinating and vaginal pain.  Musculoskeletal: Negative for back pain and gait problem.  Skin: Negative for pallor and rash.  Neurological: Negative for dizziness, tremors, weakness, numbness and headaches.  Psychiatric/Behavioral: Negative for confusion, sleep disturbance and dysphoric mood. The patient is not nervous/anxious.     Objective:  BP 132/72 mmHg  Pulse 55  Temp(Src) 98.1 F (36.7 C) (Oral)  Resp 12  Ht 5\' 3"  (1.6 m)  Wt 105 lb 1.3 oz (47.664 kg)  BMI 18.62 kg/m2  SpO2 98%  BP Readings from Last 3 Encounters:  10/23/14 132/72  07/26/14 158/98  07/02/14 128/72    Wt Readings from Last 3 Encounters:  10/23/14 105 lb 1.3 oz (47.664 kg)  07/26/14 110 lb (49.896 kg)  07/02/14 109 lb (49.442 kg)    Physical Exam  Constitutional: She appears well-developed. No distress.  HENT:  Head: Normocephalic.  Right Ear: External ear normal.  Left Ear: External ear normal.  Mouth/Throat: Oropharynx is clear and moist. No oropharyngeal exudate.  Eyes: Conjunctivae are normal. Pupils are equal, round, and reactive to light. Right eye exhibits no discharge. Left eye exhibits no discharge.  Neck: Normal range of motion. Neck supple. No JVD present. No tracheal deviation present. No thyromegaly present.  Cardiovascular: Normal rate, regular rhythm and normal heart sounds.   Pulmonary/Chest: No stridor. No respiratory distress. She has no wheezes.  Abdominal: Soft. Bowel sounds are normal. She exhibits  no distension and no mass. There is no tenderness. There is no rebound and no guarding.  Musculoskeletal: She exhibits no edema or tenderness.  Lymphadenopathy:    She has no cervical adenopathy.  Neurological: She displays normal reflexes. No cranial nerve deficit. She exhibits normal muscle tone. Coordination normal.  Skin: No rash noted. No erythema.  Psychiatric: She has a normal mood and  affect. Her behavior is normal. Judgment and thought content normal.  swollen nasal mucosa  Lab Results  Component Value Date   WBC 5.8 05/28/2014   HGB 12.4 05/28/2014   HCT 36.2 05/28/2014   PLT 184.0 05/28/2014   GLUCOSE 87 05/28/2014   CHOL 233* 05/28/2014   TRIG 211.0* 05/28/2014   HDL 51.00 05/28/2014   LDLDIRECT 161.0 05/28/2014   ALT 12 05/28/2014   AST 18 05/28/2014   NA 142 05/28/2014   K 3.8 05/28/2014   CL 105 05/28/2014   CREATININE 0.68 05/28/2014   BUN 9 05/28/2014   CO2 32 05/28/2014   TSH 2.47 05/28/2014    [place] Extrem Up Left Ltd  06/05/2014   MSK US performed of: left This study was ordered, performed, and interpreted by Charlann Boxer D.O.  Shoulder:  Supraspinatus: Appears normal on long and transverse views, no bursal  bulge seen with shoulder abduction on impingement view. Infraspinatus: Appears normal on long and transverse views. Subscapularis: Appears normal on long and transverse views. Teres Minor: Appears normal on long and transverse views. AC joint: Capsule undistended, no geyser sign. Glenohumeral Joint: Appears normal without effusion. Glenoid Labrum: Intact without visualized tears. Biceps Tendon: Appears normal on long and transverse views, no fraying of  tendon, tendon located in intertubercular groove, no subluxation with  shoulder internal or external rotation. No increased power doppler signal.  Impression:Normal shoulder  Mm Digital Screening Bilateral  06/04/2014   CLINICAL DATA:  Screening. 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  EXAM: DIGITAL SCREENING BILATERAL MAMMOGRAM WITH CAD  COMPARISON:  Previous exam(s).  ACR Breast Density Category c: The breast tissue is heterogeneously dense, which may obscure small masses.  FINDINGS: There are no findings suspicious for malignancy. Expected post lumpectomy changes in the left breast. Images were processed with CAD.  IMPRESSION: No  mammographic evidence of malignancy. A result letter of this screening mammogram will be mailed directly to the patient.  RECOMMENDATION: Screening mammogram in one year. (Code:SM-B-01Y)  BI-RADS CATEGORY  2: Benign.   Electronically Signed   By: Evangeline Dakin M.D.   On: 06/04/2014 15:45    Assessment & Plan:   Diagnoses and all orders for this visit:  Asthmatic bronchitis, mild intermittent, uncomplicated  Allergic rhinitis due to pollen  Other orders -     nadolol (CORGARD) 40 MG tablet; Take 0.5 tablets (20 mg total) by mouth 2 (two) times daily. -     fluticasone (FLONASE) 50 MCG/ACT nasal spray; Place 1 spray into both nostrils daily. -     butalbital-acetaminophen-caffeine (FIORICET, ESGIC) 50-325-40 MG per tablet; Take 1 tablet by mouth every 6 (six) hours as needed for headache. -     mometasone-formoterol (DULERA) 100-5 MCG/ACT AERO; Inhale 2 puffs into the lungs 2 (two) times daily.   I have discontinued Ms. Capps's Fluticasone Furoate-Vilanterol and azithromycin. I have also changed her fluticasone. Additionally, I am having her start on mometasone-formoterol. Lastly, I am having her maintain her Flax Seed Oil, multivitamin with minerals, traMADol, Vitamin B-12, Cholecalciferol, Pyridoxine HCl (VITAMIN B-6  PO), nadolol, and butalbital-acetaminophen-caffeine.  Meds ordered this encounter  Medications  . nadolol (CORGARD) 40 MG tablet    Sig: Take 0.5 tablets (20 mg total) by mouth 2 (two) times daily.    Dispense:  60 tablet    Refill:  11  . fluticasone (FLONASE) 50 MCG/ACT nasal spray    Sig: Place 1 spray into both nostrils daily.    Dispense:  16 g    Refill:  11  . butalbital-acetaminophen-caffeine (FIORICET, ESGIC) 50-325-40 MG per tablet    Sig: Take 1 tablet by mouth every 6 (six) hours as needed for headache.    Dispense:  100 tablet    Refill:  2  . mometasone-formoterol (DULERA) 100-5 MCG/ACT AERO    Sig: Inhale 2 puffs into the lungs 2 (two) times daily.     Dispense:  1 Inhaler    Refill:  5     Follow-up: No Follow-up on file.  Walker Kehr, MD

## 2014-10-23 NOTE — Assessment & Plan Note (Addendum)
Depomedrol 80 mg IM Dulera MDI

## 2014-10-28 ENCOUNTER — Telehealth: Payer: Self-pay | Admitting: Internal Medicine

## 2014-10-28 MED ORDER — AZITHROMYCIN 250 MG PO TABS: ORAL_TABLET | ORAL | Status: DC

## 2014-10-28 NOTE — Telephone Encounter (Signed)
Patient was in last week and was told if she wasn't any better that you'll need to see her Husband states that she is much worse. Would you like to work her in or get her in with someone else Please advise

## 2014-10-28 NOTE — Telephone Encounter (Signed)
Pls email a Zpac OV tomorrow  Thx

## 2014-10-28 NOTE — Telephone Encounter (Signed)
rx sent. Pt's husband informed by Dartha Lodge., [facility] front office team lead.

## 2014-10-30 ENCOUNTER — Ambulatory Visit (INDEPENDENT_AMBULATORY_CARE_PROVIDER_SITE_OTHER): Payer: 59 | Admitting: Internal Medicine

## 2014-10-30 ENCOUNTER — Encounter: Payer: Self-pay | Admitting: Internal Medicine

## 2014-10-30 ENCOUNTER — Ambulatory Visit (INDEPENDENT_AMBULATORY_CARE_PROVIDER_SITE_OTHER)
Admission: RE | Admit: 2014-10-30 | Discharge: 2014-10-30 | Disposition: A | Discharge status: Home / Self Care | Payer: 59 | Source: Ambulatory Visit | Attending: Internal Medicine | Admitting: Internal Medicine

## 2014-10-30 VITALS — BP 130/80 | HR 66 | Temp 99.0°F | Wt 105.0 lb

## 2014-10-30 DIAGNOSIS — R21 Rash and other nonspecific skin eruption: Secondary | ICD-10-CM

## 2014-10-30 DIAGNOSIS — J4520 Mild intermittent asthma, uncomplicated: Secondary | ICD-10-CM

## 2014-10-30 MED ORDER — PROMETHAZINE-CODEINE 6.25-10 MG/5ML PO SYRP
5.0000 mL | ORAL_SOLUTION | ORAL | Status: DC | PRN
Start: 2014-10-30 — End: 2015-06-14

## 2014-10-30 NOTE — Progress Notes (Signed)
Pre visit review using our clinic review tool, if applicable. No additional management support is needed unless otherwise documented below in the visit note. 

## 2014-10-30 NOTE — Assessment & Plan Note (Signed)
D/c Zpac Cortisone cream

## 2014-10-30 NOTE — Progress Notes (Signed)
Subjective:  Patient ID: Lindsay Schultz, female    DOB: [birth year]  Age: 64 y.o. MRN: [id number]  CC: No chief complaint on file.   HPI   Lindsay Schultz presents for SOB x 7 d, congested, itchy throat. C/o bad cough - productive C/o rash after second Z pac, cough is better    Outpatient Prescriptions Prior to Visit  Medication Sig Dispense Refill  . butalbital-acetaminophen-caffeine (FIORICET, ESGIC) 50-325-40 MG per tablet Take 1 tablet by mouth every 6 (six) hours as needed for headache. 100 tablet 2  . Cholecalciferol 2000 UNITS CAPS Take by mouth daily.    . Cyanocobalamin (VITAMIN B-12) 500 MCG SUBL 1 tab sl qd (Patient taking differently: Take 500 tablets by mouth 2 (two) times daily. ) 100 tablet 11  . fluticasone (FLONASE) 50 MCG/ACT nasal spray Place 1 spray into both nostrils daily. 16 g 11  . mometasone-formoterol (DULERA) 100-5 MCG/ACT AERO Inhale 2 puffs into the lungs 2 (two) times daily. 1 Inhaler 5  . Multiple Vitamins-Minerals (MULTIVITAMIN WITH MINERALS) tablet Take 1 tablet by mouth daily.    . nadolol (CORGARD) 40 MG tablet Take 0.5 tablets (20 mg total) by mouth 2 (two) times daily. 60 tablet 11  . Pyridoxine HCl (VITAMIN B-6 PO) Take 100 mcg by mouth daily.     . traMADol (ULTRAM) 50 MG tablet Take 1 tablet (50 mg total) by mouth every 6 (six) hours as needed for pain. 100 tablet 2  . azithromycin (ZITHROMAX) 250 MG tablet Take 2 tabs by mouth on day 1. Take 1 tab daily on days 2-5. 6 tablet 0  . Flaxseed, Linseed, (FLAX SEED OIL) 1000 MG CAPS Take 1 capsule by mouth daily.       No facility-administered medications prior to visit.    ROS Review of Systems  Constitutional: Negative for chills, activity change, appetite change, fatigue and unexpected weight change.  HENT: Positive for rhinorrhea. Negative for congestion, mouth sores and sinus pressure.   Eyes: Negative for visual disturbance.  Respiratory: Positive for cough, chest tightness and wheezing.     Cardiovascular: Negative for palpitations and leg swelling.  Gastrointestinal: Negative for nausea and abdominal pain.  Genitourinary: Negative for dysuria, frequency, difficulty urinating and vaginal pain.  Musculoskeletal: Negative for back pain and gait problem.  Skin: Positive for rash. Negative for pallor.  Neurological: Negative for dizziness, tremors, weakness, numbness and headaches.  Psychiatric/Behavioral: Negative for confusion, sleep disturbance and dysphoric mood. The patient is not nervous/anxious.     Objective:  BP 130/80 mmHg  Pulse 66  Temp(Src) 99 F (37.2 C) (Oral)  Wt 105 lb (47.628 kg)  SpO2 97%  BP Readings from Last 3 Encounters:  10/30/14 130/80  10/23/14 132/72  07/26/14 158/98    Wt Readings from Last 3 Encounters:  10/30/14 105 lb (47.628 kg)  10/23/14 105 lb 1.3 oz (47.664 kg)  07/26/14 110 lb (49.896 kg)    Physical Exam  Constitutional: She appears well-developed. No distress.  HENT:  Head: Normocephalic.  Right Ear: External ear normal.  Left Ear: External ear normal.  Mouth/Throat: Oropharynx is clear and moist. No oropharyngeal exudate.  Eyes: Conjunctivae are normal. Pupils are equal, round, and reactive to light. Right eye exhibits no discharge. Left eye exhibits no discharge.  Neck: Normal range of motion. Neck supple. No JVD present. No tracheal deviation present. No thyromegaly present.  Cardiovascular: Normal rate, regular rhythm and normal heart sounds.   Pulmonary/Chest: No stridor. No respiratory distress.  She has no wheezes.  Abdominal: Soft. Bowel sounds are normal. She exhibits no distension and no mass. There is no tenderness. There is no rebound and no guarding.  Musculoskeletal: She exhibits no edema or tenderness.  Lymphadenopathy:    She has no cervical adenopathy.  Neurological: She displays normal reflexes. No cranial nerve deficit. She exhibits normal muscle tone. Coordination normal.  Skin: Rash noted. No erythema.   Psychiatric: She has a normal mood and affect. Her behavior is normal. Judgment and thought content normal.  swollen nasal mucosa  Lab Results  Component Value Date   WBC 5.8 05/28/2014   HGB 12.4 05/28/2014   HCT 36.2 05/28/2014   PLT 184.0 05/28/2014   GLUCOSE 87 05/28/2014   CHOL 233* 05/28/2014   TRIG 211.0* 05/28/2014   HDL 51.00 05/28/2014   LDLDIRECT 161.0 05/28/2014   ALT 12 05/28/2014   AST 18 05/28/2014   NA 142 05/28/2014   K 3.8 05/28/2014   CL 105 05/28/2014   CREATININE 0.68 05/28/2014   BUN 9 05/28/2014   CO2 32 05/28/2014   TSH 2.47 05/28/2014    [place] Extrem Up Left Ltd  06/05/2014   [facility]K US performed of: left This study was ordered, performed, and interpreted by Charlann Boxer D.O.  Shoulder:  Supraspinatus: Appears normal on long and transverse views, no bursal  bulge seen with shoulder abduction on impingement view. Infraspinatus: Appears normal on long and transverse views. Subscapularis: Appears normal on long and transverse views. Teres Minor: Appears normal on long and transverse views. AC joint: Capsule undistended, no geyser sign. Glenohumeral Joint: Appears normal without effusion. Glenoid Labrum: Intact without visualized tears. Biceps Tendon: Appears normal on long and transverse views, no fraying of  tendon, tendon located in intertubercular groove, no subluxation with  shoulder internal or external rotation. No increased power doppler signal.  Impression:Normal shoulder  Mm Digital Screening Bilateral  06/04/2014   CLINICAL DATA:  Screening. 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  EXAM: DIGITAL SCREENING BILATERAL MAMMOGRAM WITH CAD  COMPARISON:  Previous exam(s).  ACR Breast Density Category c: The breast tissue is heterogeneously dense, which may obscure small masses.  FINDINGS: There are no findings suspicious for malignancy. Expected post lumpectomy changes in the left breast. Images were  processed with CAD.  IMPRESSION: No mammographic evidence of malignancy. A result letter of this screening mammogram will be mailed directly to the patient.  RECOMMENDATION: Screening mammogram in one year. (Code:SM-B-01Y)  BI-RADS CATEGORY  2: Benign.   Electronically Signed   By: Evangeline Dakin M.D.   On: 06/04/2014 15:45    Assessment & Plan:   Diagnoses and all orders for this visit:  Asthmatic bronchitis, mild intermittent, uncomplicated Orders: -     DG Chest 2 View  Rash and nonspecific skin eruption  Other orders -     promethazine-codeine (PHENERGAN WITH CODEINE) 6.25-10 MG/5ML syrup; Take 5 mLs by mouth every 4 (four) hours as needed.  I have discontinued Ms. Lavery's azithromycin. I am also having her start on promethazine-codeine. Additionally, I am having her maintain her Flax Seed Oil, multivitamin with minerals, traMADol, Vitamin B-12, Cholecalciferol, Pyridoxine HCl (VITAMIN B-6 PO), nadolol, fluticasone, butalbital-acetaminophen-caffeine, mometasone-formoterol, TURMERIC PO, Garlic, and CRANBERRY FRUIT PO.  Meds ordered this encounter  Medications  . TURMERIC PO    Sig: Take by mouth 2 (two) times daily.  . Garlic 2979 MG CAPS    Sig: Take 1 capsule by mouth daily.  Marland Kitchen  CRANBERRY FRUIT PO    Sig: Take 1 each by mouth daily.  . promethazine-codeine (PHENERGAN WITH CODEINE) 6.25-10 MG/5ML syrup    Sig: Take 5 mLs by mouth every 4 (four) hours as needed.    Dispense:  300 mL    Refill:  0     Follow-up: Return in about 3 months (around 01/30/2015) for a follow-up visit.  Walker Kehr, MD

## 2014-10-30 NOTE — Assessment & Plan Note (Signed)
D/c Zpac CXR Prom/cod [facility]

## 2014-11-04 ENCOUNTER — Other Ambulatory Visit: Payer: Self-pay | Admitting: Internal Medicine

## 2014-11-04 DIAGNOSIS — R9389 Abnormal findings on diagnostic imaging of other specified body structures: Secondary | ICD-10-CM

## 2014-11-15 ENCOUNTER — Ambulatory Visit (INDEPENDENT_AMBULATORY_CARE_PROVIDER_SITE_OTHER)
Admission: RE | Admit: 2014-11-15 | Discharge: 2014-11-15 | Disposition: A | Discharge status: Home / Self Care | Payer: 59 | Source: Ambulatory Visit | Attending: Internal Medicine | Admitting: Internal Medicine

## 2014-11-15 DIAGNOSIS — R938 Abnormal findings on diagnostic imaging of other specified body structures: Secondary | ICD-10-CM

## 2014-11-15 DIAGNOSIS — R9389 Abnormal findings on diagnostic imaging of other specified body structures: Secondary | ICD-10-CM

## 2014-11-15 MED ORDER — IOHEXOL 300 MG/ML  SOLN
100.0000 mL | Freq: Once | INTRAMUSCULAR | Status: AC | PRN
  Administered 2014-11-15: 80 mL via INTRAVENOUS

## 2014-11-16 ENCOUNTER — Other Ambulatory Visit: Payer: Self-pay | Admitting: Internal Medicine

## 2014-11-16 DIAGNOSIS — J471 Bronchiectasis with (acute) exacerbation: Secondary | ICD-10-CM

## 2014-11-26 ENCOUNTER — Ambulatory Visit: Payer: 59 | Admitting: Internal Medicine

## 2014-12-09 ENCOUNTER — Other Ambulatory Visit (INDEPENDENT_AMBULATORY_CARE_PROVIDER_SITE_OTHER): Payer: 59

## 2014-12-09 ENCOUNTER — Ambulatory Visit (INDEPENDENT_AMBULATORY_CARE_PROVIDER_SITE_OTHER): Payer: 59 | Admitting: Pulmonary Disease

## 2014-12-09 ENCOUNTER — Encounter: Payer: Self-pay | Admitting: Pulmonary Disease

## 2014-12-09 VITALS — BP 136/78 | HR 55 | Temp 98.2°F | Ht 63.0 in | Wt 105.8 lb

## 2014-12-09 DIAGNOSIS — J4520 Mild intermittent asthma, uncomplicated: Secondary | ICD-10-CM | POA: Diagnosis not present

## 2014-12-09 DIAGNOSIS — J479 Bronchiectasis, uncomplicated: Secondary | ICD-10-CM | POA: Diagnosis not present

## 2014-12-09 LAB — CBC WITH DIFFERENTIAL/PLATELET
BASOS PCT: 0.4 % (ref 0.0–3.0)
Basophils Absolute: 0 10*3/uL (ref 0.0–0.1)
EOS ABS: 0.1 10*3/uL (ref 0.0–0.7)
Eosinophils Relative: 1.9 % (ref 0.0–5.0)
HCT: 37.9 % (ref 36.0–46.0)
HEMOGLOBIN: 12.7 g/dL (ref 12.0–15.0)
LYMPHS ABS: 1.8 10*3/uL (ref 0.7–4.0)
Lymphocytes Relative: 26.9 % (ref 12.0–46.0)
MCHC: 33.5 g/dL (ref 30.0–36.0)
MCV: 91.9 fl (ref 78.0–100.0)
Monocytes Absolute: 0.5 10*3/uL (ref 0.1–1.0)
Monocytes Relative: 8.1 % (ref 3.0–12.0)
NEUTROS PCT: 62.7 % (ref 43.0–77.0)
Neutro Abs: 4.2 10*3/uL (ref 1.4–7.7)
PLATELETS: 197 10*3/uL (ref 150.0–400.0)
RBC: 4.12 Mil/uL (ref 3.87–5.11)
RDW: 13.4 % (ref 11.5–15.5)
WBC: 6.6 10*3/uL (ref 4.0–10.5)

## 2014-12-09 LAB — RHEUMATOID FACTOR

## 2014-12-09 NOTE — Progress Notes (Signed)
Subjective:    Patient ID: Lindsay Schultz, female    DOB: Sep 10, 1950, 64 y.o.   MRN: [id number]  HPI Referred for evaluation of bronchiectasis. Mrs. Warne is a 64 year old female with history of asthmatic bronchitis, sinusitis, and seasonal allergies She had a sinus infection in July with dyspnea and wheezing. associated with cough with yellowish mucus. Treated with z pack and dulera. Her symptoms have resolved she feels well. She has stopped taking the inhaler on an regular basis and uses it only as needed. She got an x-ray which showed some pulmonary nodules. A follow-up CAT scan showed multiple inflammatory nodules and bronchiectasis and previous granulomatous disease.  She has similar attacks about once a year. She states that the infection usually begins in the sinus and then moves into the lung. This usually resolves after course of antibiotics. She has H/O seasonal allergies (worst in fall and spring) and asthmatic bronchitis. Never smoked.   Family history: Father had emphysema, rheumatoid arthritis, CAD Mother had emphysema, strokes Brother had cancer.   Data: CT scan (11/15/14) 1. Multiple nodules in the left upper lobe. 2. Multiple areas of bronchiectasis with soft tissue filling the distal dilated bronchi bilaterally. The endobronchial lesions or mucoid impactions and could create these findings. 3. Findings of previous granulomatous disease. 4. I suspect the nodules and bronchial impactions are benign inflammatory changes rather than metastatic disease.   Review of Systems  Constitutional: Negative for fever and unexpected weight change.  HENT: Positive for congestion, ear pain, postnasal drip, sinus pressure and sneezing. Negative for dental problem, nosebleeds, rhinorrhea, sore throat and trouble swallowing.   Eyes: Negative for redness and itching.  Respiratory: Positive for cough. Negative for chest tightness, shortness of breath and wheezing.   Cardiovascular: Negative for  palpitations and leg swelling.  Gastrointestinal: Negative for nausea and vomiting.  Genitourinary: Negative for dysuria.  Musculoskeletal: Negative for joint swelling.  Skin: Negative for rash.  Neurological: Negative for headaches.  Hematological: Bruises/bleeds easily.  Psychiatric/Behavioral: Negative for dysphoric mood. The patient is not nervous/anxious.       Objective:   Physical Exam  Constitutional: She is oriented to person, place, and time. She appears well-developed and well-nourished.  HENT:  Mouth/Throat: Oropharynx is clear and moist.  Eyes: Pupils are equal, round, and reactive to light.  Neck: Normal range of motion. Neck supple.  Cardiovascular: Normal rate and normal heart sounds.   Pulmonary/Chest: Effort normal.  Abdominal: Soft. Bowel sounds are normal.  Musculoskeletal: Normal range of motion.  Neurological: She is alert and oriented to person, place, and time.  Skin: Skin is warm and dry.  Psychiatric: She has a normal mood and affect.      Assessment & Plan:   Mrs. Heenan is seen in the pulmonary clinic for evaluation of recent resp tract infection, bronchiectasis seen on CT scan. She has history of asthmatic bronchitis and sinusitis. There is no family history of bronchiectasis to suggest cystic fibrosis. At present she is asymptomatic and back to baseline. She does have chronic sinusitis but nothing else to suggest Kartagener syndrome.  I will do an evaluation of her bronchiectasis, check for Ig deficiencies, CF mutation analysis, A1AT levels,  RF, sputum cultures and check PFTs. She is asymptomatic so there is no need for any acute intervention at this point. I'll also get a set of full PFTs. Return to clinic in 1-2 months to discuss the results of the studies.  Plan. - Check CBC, Ig levels, CF analysis, sputum cultures,  A1AT levels, RF - PFTs  Return to clinic in 1-2 months.   Marshell Garfinkel MD [facility] Pager [phone] If no answer or after 3pm call: [phone] 12/09/2014, 2:14 PM

## 2014-12-09 NOTE — Patient Instructions (Signed)
Give blood work for test Schedule pulmonary function tests Return to clinic in 2 months

## 2014-12-10 LAB — IGG, IGA, IGM
IgA: 237 mg/dL (ref 69–380)
IgG (Immunoglobin G), Serum: 773 mg/dL (ref 690–1700)
IgM, Serum: 64 mg/dL (ref 52–322)

## 2014-12-10 LAB — IGE: IGE (IMMUNOGLOBULIN E), SERUM: 56 kU/L (ref <115)

## 2015-01-04 LAB — CULTURE, FUNGUS WITHOUT SMEAR

## 2015-01-21 LAB — AFB CULTURE WITH SMEAR (NOT AT ARMC): ACID FAST SMEAR: NONE SEEN

## 2015-02-10 ENCOUNTER — Ambulatory Visit (INDEPENDENT_AMBULATORY_CARE_PROVIDER_SITE_OTHER): Payer: 59 | Admitting: Pulmonary Disease

## 2015-02-10 ENCOUNTER — Ambulatory Visit: Payer: 59 | Admitting: Pulmonary Disease

## 2015-02-10 DIAGNOSIS — J4520 Mild intermittent asthma, uncomplicated: Secondary | ICD-10-CM

## 2015-02-10 DIAGNOSIS — J479 Bronchiectasis, uncomplicated: Secondary | ICD-10-CM | POA: Diagnosis not present

## 2015-02-10 LAB — PULMONARY FUNCTION TEST
DL/VA % PRED: 109 %
DL/VA: 5.18 ml/min/mmHg/L
DLCO unc % pred: 78 %
DLCO unc: 18.55 ml/min/mmHg
FEF 25-75 POST: 1.62 L/s
FEF 25-75 Pre: 1.42 L/sec
FEF2575-%CHANGE-POST: 14 %
FEF2575-%PRED-POST: 75 %
FEF2575-%PRED-PRE: 65 %
FEV1-%Change-Post: 3 %
FEV1-%PRED-PRE: 74 %
FEV1-%Pred-Post: 77 %
FEV1-Post: 1.85 L
FEV1-Pre: 1.79 L
FEV1FVC-%CHANGE-POST: 3 %
FEV1FVC-%PRED-PRE: 99 %
FEV6-%CHANGE-POST: 0 %
FEV6-%Pred-Post: 77 %
FEV6-%Pred-Pre: 77 %
FEV6-Post: 2.34 L
FEV6-Pre: 2.35 L
FEV6FVC-%Pred-Post: 104 %
FEV6FVC-%Pred-Pre: 104 %
FVC-%Change-Post: 0 %
FVC-%PRED-POST: 74 %
FVC-%PRED-PRE: 74 %
FVC-POST: 2.34 L
FVC-Pre: 2.35 L
POST FEV1/FVC RATIO: 79 %
PRE FEV1/FVC RATIO: 76 %
Post FEV6/FVC ratio: 100 %
Pre FEV6/FVC Ratio: 100 %
RV % pred: 81 %
RV: 1.67 L
TLC % pred: 79 %
TLC: 3.98 L

## 2015-02-10 NOTE — Progress Notes (Signed)
PFT done today. 

## 2015-02-17 ENCOUNTER — Other Ambulatory Visit: Payer: 59

## 2015-02-17 ENCOUNTER — Encounter: Payer: Self-pay | Admitting: Pulmonary Disease

## 2015-02-17 ENCOUNTER — Ambulatory Visit (INDEPENDENT_AMBULATORY_CARE_PROVIDER_SITE_OTHER): Payer: 59 | Admitting: Pulmonary Disease

## 2015-02-17 VITALS — BP 128/84 | HR 50 | Ht 63.0 in | Wt 106.6 lb

## 2015-02-17 DIAGNOSIS — J4520 Mild intermittent asthma, uncomplicated: Secondary | ICD-10-CM

## 2015-02-17 NOTE — Progress Notes (Signed)
Subjective:    Patient ID: Lindsay Schultz, female    DOB: 12-Oct-1950, 64 y.o.   MRN: [id number] PROBLEM LIST: Asthmatic Bronchitis Bronchiectasis Sinusitis  Seasonal allergies  HPI Referred for evaluation of bronchiectasis. [name] is a 64 year old female with history of asthmatic bronchitis, sinusitis, and seasonal allergies She had a sinus infection in July with dyspnea and wheezing. associated with cough with yellowish mucus. Treated with z pack and dulera. Her symptoms have resolved she feels well. She has stopped taking the inhaler on an regular basis and uses it only as needed. She got an x-ray which showed some pulmonary nodules. A follow-up CAT scan showed multiple inflammatory nodules and bronchiectasis and previous granulomatous disease.  She has similar attacks about once a year. She states that the infection usually begins in the sinus and then moves into the lung. This usually resolves after course of antibiotics. She has H/O seasonal allergies (worst in fall and spring) and asthmatic bronchitis. Never smoked.   Interim history: She continues to feel well with no exacerbations of her asthmatic bronchitis or bronchiectasis. She's had a workup for her bronchiectasis including CBC, Ig levels, sputum cultures, A1AT levels, RF which were normal. PFTs last week not show any obstruction and very mild restriction and reduction in diffusion capacity.   Her husband has recently been diagnosed with colon cancer and they are struggling with this diagnosis and his healthcare needs.  Family history: Father had emphysema, rheumatoid arthritis, CAD Mother had emphysema, strokes Brother had cancer.   Data: CT scan (11/15/14) 1. Multiple nodules in the left upper lobe. 2. Multiple areas of bronchiectasis with soft tissue filling the distal dilated bronchi bilaterally. The endobronchial lesions or mucoid impactions and could create these findings. 3. Findings of previous granulomatous  disease. 4. I suspect the nodules and bronchial impactions are benign inflammatory changes rather than metastatic disease.  12/09/14 IgG-773 IgG 8-2 37 IgM-64 IgE-56 Rheumatoid factor < 10 All within normal limits.  AFB and fungal cultures- no acid-fast bacilli, Candida albicans.  PFTs (02/10/15) FVC 2.35 (74%) FEV1 1.79 (74%) F/F 77 TLC 79% DLCO 78% No obstruction. Mild reduction in lung volumes and diffusion capacity  Review of Systems  Constitutional: Negative for fever and unexpected weight change.  HENT: Positive for congestion, ear pain, postnasal drip, sinus pressure and sneezing. Negative for dental problem, nosebleeds, rhinorrhea, sore throat and trouble swallowing.   Eyes: Negative for redness and itching.  Respiratory: Positive for cough. Negative for chest tightness, shortness of breath and wheezing.   Cardiovascular: Negative for palpitations and leg swelling.  Gastrointestinal: Negative for nausea and vomiting.  Genitourinary: Negative for dysuria.  Musculoskeletal: Negative for joint swelling.  Skin: Negative for rash.  Neurological: Negative for headaches.  Hematological: Bruises/bleeds easily.  Psychiatric/Behavioral: Negative for dysphoric mood. The patient is not nervous/anxious.    Blood pressure 128/84, pulse 50, height 5\' 3"  (1.6 m), weight 106 lb 9.6 oz (48.353 kg), SpO2 100 %.     Objective:   Physical Exam  Constitutional: She is oriented to person, place, and time. She appears well-developed and well-nourished.  HENT:  Mouth/Throat: Oropharynx is clear and moist.  Eyes: Pupils are equal, round, and reactive to light.  Neck: Normal range of motion. Neck supple.  Cardiovascular: Normal rate and normal heart sounds.   Pulmonary/Chest: Effort normal.  Abdominal: Soft. Bowel sounds are normal.  Musculoskeletal: Normal range of motion.  Neurological: She is alert and oriented to person, place, and time.  Skin: Skin is  warm and dry.  Psychiatric:  She has a normal mood and affect.      Assessment & Plan:   Lindsay Schultz is seen in the pulmonary clinic for evaluation of recent resp tract infection, bronchiectasis seen on CT scan. She has history of asthmatic bronchitis and sinusitis. There is no family history of bronchiectasis to suggest cystic fibrosis. At present she is asymptomatic and back to baseline. She does have chronic sinusitis but nothing else to suggest Kartagener syndrome. All recent labs including IgE levels were within normal limits. There is no obstruction on PFTs but she does have very mild restriction and diffusion capacity impairment. She's had a CF panel sent off at the last visit but these was still pending.  She'll continue the [facility] as prescribed. I'll also give her an acapella valve for optimal mucus clearance.  Plan. - Follow up on CF analysis. - Continue Dulera. - Acapella valve  Return to clinic in 6 months.   Marshell Garfinkel MD [facility] Pager [phone]  If no answer or after 3pm call: [phone] 02/17/2015, 12:17 PM

## 2015-02-17 NOTE — Addendum Note (Signed)
Addended by: Parke Poisson E on: 02/17/2015 12:37 PM   Modules accepted: Orders

## 2015-02-17 NOTE — Patient Instructions (Signed)
We will order some blood tests to evaluate bronchiectasis. He'll be given an Acapella valve and instructions on how to use it to clear the mucus.  Return to clinic in 6 months

## 2015-02-18 MED ORDER — FLUTTER DEVI: Status: DC

## 2015-02-18 NOTE — Addendum Note (Signed)
Addended by: Parke Poisson E on: 02/18/2015 02:01 PM   Modules accepted: Orders

## 2015-02-19 LAB — CYSTIC FIBROSIS DIAGNOSTIC STUDY

## 2015-05-20 ENCOUNTER — Other Ambulatory Visit: Payer: Self-pay

## 2015-05-20 DIAGNOSIS — Z853 Personal history of malignant neoplasm of breast: Secondary | ICD-10-CM

## 2015-05-20 DIAGNOSIS — Z1231 Encounter for screening mammogram for malignant neoplasm of breast: Secondary | ICD-10-CM

## 2015-06-09 ENCOUNTER — Ambulatory Visit
Admission: RE | Admit: 2015-06-09 | Discharge: 2015-06-09 | Disposition: A | Discharge status: Home / Self Care | Payer: 59 | Source: Ambulatory Visit

## 2015-06-09 DIAGNOSIS — Z853 Personal history of malignant neoplasm of breast: Secondary | ICD-10-CM

## 2015-06-09 DIAGNOSIS — Z1231 Encounter for screening mammogram for malignant neoplasm of breast: Secondary | ICD-10-CM

## 2015-06-14 ENCOUNTER — Ambulatory Visit (INDEPENDENT_AMBULATORY_CARE_PROVIDER_SITE_OTHER): Payer: 59 | Admitting: Family Medicine

## 2015-06-14 ENCOUNTER — Encounter: Payer: Self-pay | Admitting: Family Medicine

## 2015-06-14 VITALS — BP 130/90 | HR 57 | Temp 98.4°F | Resp 20 | Ht 63.0 in | Wt 108.5 lb

## 2015-06-14 DIAGNOSIS — H8111 Benign paroxysmal vertigo, right ear: Secondary | ICD-10-CM | POA: Diagnosis not present

## 2015-06-14 DIAGNOSIS — J0100 Acute maxillary sinusitis, unspecified: Secondary | ICD-10-CM

## 2015-06-14 DIAGNOSIS — H8110 Benign paroxysmal vertigo, unspecified ear: Secondary | ICD-10-CM

## 2015-06-14 HISTORY — DX: Benign paroxysmal vertigo, unspecified ear: H81.10

## 2015-06-14 MED ORDER — DOXYCYCLINE HYCLATE 100 MG PO TABS: 100.0000 mg | ORAL_TABLET | Freq: Two times a day (BID) | ORAL | Status: DC

## 2015-06-14 MED ORDER — MECLIZINE HCL 25 MG PO TABS: 25.0000 mg | ORAL_TABLET | Freq: Three times a day (TID) | ORAL | Status: DC | PRN

## 2015-06-14 NOTE — Assessment & Plan Note (Signed)
Describes spinning with some nausea when she turns to right, resolves when changes position. No tinnitus or hearing loss. Given Meclizine to use prn return if worsens or does not resolve

## 2015-06-14 NOTE — Patient Instructions (Signed)
Sinusitis, Adult Sinusitis is redness, soreness, and inflammation of the paranasal sinuses. Paranasal sinuses are air pockets within the bones of your face. They are located beneath your eyes, in the middle of your forehead, and above your eyes. In healthy paranasal sinuses, mucus is able to drain out, and air is able to circulate through them by [place] of your nose. However, when your paranasal sinuses are inflamed, mucus and air can become trapped. This can allow bacteria and other germs to grow and cause infection. Sinusitis can develop quickly and last only a short time (acute) or continue over a long period (chronic). Sinusitis that lasts for more than 12 weeks is considered chronic. CAUSES Causes of sinusitis include:  Allergies.  Structural abnormalities, such as displacement of the cartilage that separates your nostrils (deviated septum), which can decrease the air flow through your nose and sinuses and affect sinus drainage.  Functional abnormalities, such as when the small hairs (cilia) that line your sinuses and help remove mucus do not work properly or are not present. SIGNS AND SYMPTOMS Symptoms of acute and chronic sinusitis are the same. The primary symptoms are pain and pressure around the affected sinuses. Other symptoms include:  Upper toothache.  Earache.  Headache.  Bad breath.  Decreased sense of smell and taste.  A cough, which worsens when you are lying flat.  Fatigue.  Fever.  Thick drainage from your nose, which often is green and may contain pus (purulent).  Swelling and warmth over the affected sinuses. DIAGNOSIS Your health care provider will perform a physical exam. During your exam, your health care provider may perform any of the following to help determine if you have acute sinusitis or chronic sinusitis:  Look in your nose for signs of abnormal growths in your nostrils (nasal polyps).  Tap over the affected sinus to check for signs of  infection.  View the inside of your sinuses using an imaging device that has a light attached (endoscope). If your health care provider suspects that you have chronic sinusitis, one or more of the following tests may be recommended:  Allergy tests.  Nasal culture. A sample of mucus is taken from your nose, sent to a lab, and screened for bacteria.  Nasal cytology. A sample of mucus is taken from your nose and examined by your health care provider to determine if your sinusitis is related to an allergy. TREATMENT Most cases of acute sinusitis are related to a viral infection and will resolve on their own within 10 days. Sometimes, medicines are prescribed to help relieve symptoms of both acute and chronic sinusitis. These may include pain medicines, decongestants, nasal steroid sprays, or saline sprays. However, for sinusitis related to a bacterial infection, your health care provider will prescribe antibiotic medicines. These are medicines that will help kill the bacteria causing the infection. Rarely, sinusitis is caused by a fungal infection. In these cases, your health care provider will prescribe antifungal medicine. For some cases of chronic sinusitis, surgery is needed. Generally, these are cases in which sinusitis recurs more than 3 times per year, despite other treatments. HOME CARE INSTRUCTIONS  Drink plenty of water. Water helps thin the mucus so your sinuses can drain more easily.  Use a humidifier.  Inhale steam 3-4 times a day (for example, sit in the bathroom with the shower running).  Apply a warm, moist washcloth to your face 3-4 times a day, or as directed by your health care provider.  Use saline nasal sprays to help   moisten and clean your sinuses.  Take medicines only as directed by your health care provider.  If you were prescribed either an antibiotic or antifungal medicine, finish it all even if you start to feel better. SEEK IMMEDIATE MEDICAL CARE IF:  You have  increasing pain or severe headaches.  You have nausea, vomiting, or drowsiness.  You have swelling around your face.  You have vision problems.  You have a stiff neck.  You have difficulty breathing.   This information is not intended to replace advice given to you by your health care provider. Make sure you discuss any questions you have with your health care provider.   Document Released: 04/05/2005 Document Revised: 04/26/2014 Document Reviewed: 04/20/2011 Elsevier Interactive Patient Education 2016 [employer]. Vertigo Vertigo means you feel like you or your surroundings are moving when they are not. Vertigo can be dangerous if it occurs when you are at work, driving, or performing difficult activities.  CAUSES  Vertigo occurs when there is a conflict of signals sent to your brain from the visual and sensory systems in your body. There are many different causes of vertigo, including:  Infections, especially in the inner ear.  A bad reaction to a drug or misuse of alcohol and medicines.  Withdrawal from drugs or alcohol.  Rapidly changing positions, such as lying down or rolling over in bed.  A migraine headache.  Decreased blood flow to the brain.  Increased pressure in the brain from a head injury, infection, tumor, or bleeding. SYMPTOMS  You may feel as though the world is spinning around or you are falling to the ground. Because your balance is upset, vertigo can cause nausea and vomiting. You may have involuntary eye movements (nystagmus). DIAGNOSIS  Vertigo is usually diagnosed by physical exam. If the cause of your vertigo is unknown, your caregiver may perform imaging tests, such as an MRI scan (magnetic resonance imaging). TREATMENT  Most cases of vertigo resolve on their own, without treatment. Depending on the cause, your caregiver may prescribe certain medicines. If your vertigo is related to body position issues, your caregiver may recommend movements or  procedures to correct the problem. In rare cases, if your vertigo is caused by certain inner ear problems, you may need surgery. HOME CARE INSTRUCTIONS   Follow your caregiver's instructions.  Avoid driving.  Avoid operating heavy machinery.  Avoid performing any tasks that would be dangerous to you or others during a vertigo episode.  Tell your caregiver if you notice that certain medicines seem to be causing your vertigo. Some of the medicines used to treat vertigo episodes can actually make them worse in some people. SEEK IMMEDIATE MEDICAL CARE IF:   Your medicines do not relieve your vertigo or are making it worse.  You develop problems with talking, walking, weakness, or using your arms, hands, or legs.  You develop severe headaches.  Your nausea or vomiting continues or gets worse.  You develop visual changes.  A family member notices behavioral changes.  Your condition gets worse. MAKE SURE YOU:  Understand these instructions.  Will watch your condition.  Will get help right away if you are not doing well or get worse.   This information is not intended to replace advice given to you by your health care provider. Make sure you discuss any questions you have with your health care provider.   Document Released: 01/13/2005 Document Revised: 06/28/2011 Document Reviewed: 07/29/2014 Elsevier Interactive Patient Education [employer].

## 2015-06-14 NOTE — Progress Notes (Signed)
Pre visit review using our clinic review tool, if applicable. No additional management support is needed unless otherwise documented below in the visit note. 

## 2015-06-14 NOTE — Assessment & Plan Note (Signed)
Right maxillary. Started on Doxycycline, Mucinex and continue probiotics

## 2015-06-15 NOTE — Progress Notes (Signed)
Patient ID: Lindsay Schultz, female   DOB: 10/06/1950, 65 y.o.   MRN: [id number]   Subjective:    Patient ID: Lindsay Schultz, female    DOB: May 31, 1950, 65 y.o.   MRN: [id number]  Chief Complaint  Patient presents with  . right ear pain    x 2 weeks  . Dizziness    HPI Patient is in today for evaluation of congestion. She has had allergic symptoms for several weeks. She previously has taken allergy shots but is not taking them now. She is having some right ear pain but no left ear pain. She has had congestion but no chest congestion. Patient has cough but it is dry. She notes recently when she rolls over in bed to the right she is dizzy and nauseous but when she flattens out she feels better. No fevers, chills, anorexia, diarrhea or vomiting. Past Medical History  Diagnosis Date  . Allergic rhinitis   . Hx of breast cancer   . GERD (gastroesophageal reflux disease)   . HTN (hypertension)   . Osteopenia   . Benign paroxysmal positional vertigo 06/14/2015    Past Surgical History  Procedure Laterality Date  . Abdominal hysterectomy    . Breast lumpectomy  2008    Left  . Nasal sinus surgery  1997  . Appendectomy      Family History  Problem Relation Age of Onset  . Sjogren's syndrome Mother   . Hypertension Other   . Arthritis Father 43    RA  . Emphysema Father   . Heart disease Father   . Hodgkin's lymphoma Brother     Social History   Social History  . Marital Status: Married    Spouse Name: N/A  . Number of Children: N/A  . Years of Education: N/A   Occupational History  . Retired     Orthoptist   Social History Main Topics  . Smoking status: Never Smoker   . Smokeless tobacco: Never Used  . Alcohol Use: No  . Drug Use: No  . Sexual Activity: Yes   Other Topics Concern  . Not on file   Social History Narrative   Regular Exercise- yes    Outpatient Prescriptions Prior to Visit  Medication Sig Dispense Refill  . butalbital-acetaminophen-caffeine  (FIORICET, ESGIC) 50-325-40 MG per tablet Take 1 tablet by mouth every 6 (six) hours as needed for headache. 100 tablet 2  . Cholecalciferol 2000 UNITS CAPS Take by mouth daily.    Marland Kitchen CRANBERRY FRUIT PO Take 1 each by mouth daily.    . Cyanocobalamin (VITAMIN B-12) 500 MCG SUBL 1 tab sl qd (Patient taking differently: Take 500 tablets by mouth 2 (two) times daily. ) 100 tablet 11  . fluticasone (FLONASE) 50 MCG/ACT nasal spray Place 1 spray into both nostrils daily. (Patient taking differently: Place 1 spray into both nostrils daily as needed. ) 16 g 11  . Garlic 123XX123 MG CAPS Take 1 capsule by mouth daily.    . Multiple Vitamins-Minerals (MULTIVITAMIN WITH MINERALS) tablet Take 1 tablet by mouth daily.    . nadolol (CORGARD) 40 MG tablet Take 0.5 tablets (20 mg total) by mouth 2 (two) times daily. 60 tablet 11  . Pyridoxine HCl (VITAMIN B-6 PO) Take 100 mcg by mouth 2 (two) times daily.     Marland Kitchen Respiratory Therapy Supplies (FLUTTER) DEVI Use as directed. 1 each 0  . TURMERIC PO Take by mouth 2 (two) times daily.    . mometasone-formoterol (  DULERA) 100-5 MCG/ACT AERO Inhale 2 puffs into the lungs 2 (two) times daily. (Patient taking differently: Inhale 2 puffs into the lungs 2 (two) times daily as needed. ) 1 Inhaler 5  . promethazine-codeine (PHENERGAN WITH CODEINE) 6.25-10 MG/5ML syrup Take 5 mLs by mouth every 4 (four) hours as needed. 300 mL 0   No facility-administered medications prior to visit.    Allergies  Allergen Reactions  . Ciprofloxacin     REACTION: rash along the vein w/IV Cipro  . Clarithromycin     REACTION: rash  . Meloxicam     Upset stomach  . Paroxetine     Passed out  . Penicillins     REACTION: passed out after a shot at 65 y.o  . Zithromax [Azithromycin] Rash    Review of Systems  Constitutional: Positive for malaise/fatigue. Negative for fever and chills.  HENT: Positive for congestion and ear pain.   Eyes: Negative for discharge.  Respiratory: Positive for  cough. Negative for shortness of breath.   Cardiovascular: Negative for chest pain, palpitations and leg swelling.  Gastrointestinal: Positive for nausea. Negative for vomiting and abdominal pain.  Genitourinary: Negative for dysuria.  Musculoskeletal: Negative for falls.  Skin: Negative for rash.  Neurological: Positive for dizziness and headaches. Negative for tingling, sensory change, focal weakness and loss of consciousness.  Endo/Heme/Allergies: Negative for environmental allergies.  Psychiatric/Behavioral: Negative for depression. The patient is not nervous/anxious.        Objective:    Physical Exam  Constitutional: She is oriented to person, place, and time. She appears well-developed and well-nourished. No distress.  HENT:  Head: Normocephalic and atraumatic.  Nose: Nose normal.  TM on right is dull and retracted  Eyes: Right eye exhibits no discharge. Left eye exhibits no discharge.  Neck: Normal range of motion. Neck supple.  Cardiovascular: Normal rate and regular rhythm.   No murmur heard. Pulmonary/Chest: Effort normal and breath sounds normal.  Abdominal: Soft. Bowel sounds are normal. There is no tenderness.  Musculoskeletal: She exhibits no edema.  Lymphadenopathy:    She has cervical adenopathy.  Neurological: She is alert and oriented to person, place, and time.  1 beat of nystagmus with right ward gaze.   Skin: Skin is warm and dry.  Psychiatric: She has a normal mood and affect.  Nursing note and vitals reviewed.   BP 130/90 mmHg  Pulse 57  Temp(Src) 98.4 F (36.9 C) (Oral)  Resp 20  Ht 5\' 3"  (1.6 m)  Wt 108 lb 8 oz (49.215 kg)  BMI 19.22 kg/m2  SpO2 98% Wt Readings from Last 3 Encounters:  06/14/15 108 lb 8 oz (49.215 kg)  02/17/15 106 lb 9.6 oz (48.353 kg)  12/09/14 105 lb 12.8 oz (47.991 kg)     Lab Results  Component Value Date   WBC 6.6 12/09/2014   HGB 12.7 12/09/2014   HCT 37.9 12/09/2014   PLT 197.0 12/09/2014   GLUCOSE 87  05/28/2014   CHOL 233* 05/28/2014   TRIG 211.0* 05/28/2014   HDL 51.00 05/28/2014   LDLDIRECT 161.0 05/28/2014   ALT 12 05/28/2014   AST 18 05/28/2014   NA 142 05/28/2014   K 3.8 05/28/2014   CL 105 05/28/2014   CREATININE 0.68 05/28/2014   BUN 9 05/28/2014   CO2 32 05/28/2014   TSH 2.47 05/28/2014    Lab Results  Component Value Date   TSH 2.47 05/28/2014   Lab Results  Component Value Date   WBC 6.6 12/09/2014  HGB 12.7 12/09/2014   HCT 37.9 12/09/2014   MCV 91.9 12/09/2014   PLT 197.0 12/09/2014   Lab Results  Component Value Date   NA 142 05/28/2014   K 3.8 05/28/2014   CO2 32 05/28/2014   GLUCOSE 87 05/28/2014   BUN 9 05/28/2014   CREATININE 0.68 05/28/2014   BILITOT 0.3 05/28/2014   ALKPHOS 92 05/28/2014   AST 18 05/28/2014   ALT 12 05/28/2014   PROT 6.8 05/28/2014   ALBUMIN 4.2 05/28/2014   CALCIUM 9.3 05/28/2014   GFR 92.85 05/28/2014   Lab Results  Component Value Date   CHOL 233* 05/28/2014   Lab Results  Component Value Date   HDL 51.00 05/28/2014   No results found for: [facility] Lab Results  Component Value Date   TRIG 211.0* 05/28/2014   Lab Results  Component Value Date   CHOLHDL 5 05/28/2014   No results found for: HGBA1C     Assessment & Plan:   Problem List Items Addressed This Visit    Benign paroxysmal positional vertigo    Describes spinning with some nausea when she turns to right, resolves when changes position. No tinnitus or hearing loss. Given Meclizine to use prn return if worsens or does not resolve      Relevant Medications   meclizine (ANTIVERT) 25 MG tablet   Sinusitis, acute - Primary    Right maxillary. Started on Doxycycline, Mucinex and continue probiotics      Relevant Medications   doxycycline (VIBRA-TABS) 100 MG tablet      I have discontinued Ms. Taff's mometasone-formoterol and promethazine-codeine. I am also having her start on doxycycline and meclizine. Additionally, I am having her maintain her  multivitamin with minerals, Vitamin B-12, Cholecalciferol, Pyridoxine HCl (VITAMIN B-6 PO), nadolol, fluticasone, butalbital-acetaminophen-caffeine, TURMERIC PO, Garlic, CRANBERRY FRUIT PO, and FLUTTER.  Meds ordered this encounter  Medications  . doxycycline (VIBRA-TABS) 100 MG tablet    Sig: Take 1 tablet (100 mg total) by mouth 2 (two) times daily.    Dispense:  20 tablet    Refill:  0  . meclizine (ANTIVERT) 25 MG tablet    Sig: Take 1 tablet (25 mg total) by mouth 3 (three) times daily as needed for dizziness.    Dispense:  30 tablet    Refill:  0     Penni Homans, MD

## 2015-08-26 ENCOUNTER — Encounter: Payer: Self-pay | Admitting: Internal Medicine

## 2015-08-26 ENCOUNTER — Ambulatory Visit (INDEPENDENT_AMBULATORY_CARE_PROVIDER_SITE_OTHER): Payer: 59 | Admitting: Internal Medicine

## 2015-08-26 VITALS — BP 130/74 | HR 55 | Temp 98.0°F | Wt 110.0 lb

## 2015-08-26 DIAGNOSIS — J0100 Acute maxillary sinusitis, unspecified: Secondary | ICD-10-CM

## 2015-08-26 MED ORDER — FLUCONAZOLE 150 MG PO TABS: 150.0000 mg | ORAL_TABLET | Freq: Once | ORAL | Status: DC

## 2015-08-26 MED ORDER — CEFDINIR 300 MG PO CAPS: 300.0000 mg | ORAL_CAPSULE | Freq: Two times a day (BID) | ORAL | Status: DC

## 2015-08-26 NOTE — Progress Notes (Signed)
Pre visit review using our clinic review tool, if applicable. No additional management support is needed unless otherwise documented below in the visit note. 

## 2015-08-26 NOTE — Assessment & Plan Note (Signed)
Sinus rinse 

## 2015-08-26 NOTE — Progress Notes (Signed)
Subjective:  Patient ID: Lindsay Schultz, female    DOB: [birth year]  Age: 65 y.o. MRN: [id number]  CC: No chief complaint on file.   HPI Fara Bonta Lockyer presents for URI/ sinusitis infection 3-4 d   Outpatient Prescriptions Prior to Visit  Medication Sig Dispense Refill  . butalbital-acetaminophen-caffeine (FIORICET, ESGIC) 50-325-40 MG per tablet Take 1 tablet by mouth every 6 (six) hours as needed for headache. 100 tablet 2  . Cholecalciferol 2000 UNITS CAPS Take by mouth daily.    Marland Kitchen CRANBERRY FRUIT PO Take 1 each by mouth daily.    . Cyanocobalamin (VITAMIN B-12) 500 MCG SUBL 1 tab sl qd (Patient taking differently: Take 500 tablets by mouth 2 (two) times daily. ) 100 tablet 11  . doxycycline (VIBRA-TABS) 100 MG tablet Take 1 tablet (100 mg total) by mouth 2 (two) times daily. 20 tablet 0  . fluticasone (FLONASE) 50 MCG/ACT nasal spray Place 1 spray into both nostrils daily. (Patient taking differently: Place 1 spray into both nostrils daily as needed. ) 16 g 11  . Garlic 123XX123 MG CAPS Take 1 capsule by mouth daily.    . Multiple Vitamins-Minerals (MULTIVITAMIN WITH MINERALS) tablet Take 1 tablet by mouth daily.    . nadolol (CORGARD) 40 MG tablet Take 0.5 tablets (20 mg total) by mouth 2 (two) times daily. 60 tablet 11  . Pyridoxine HCl (VITAMIN B-6 PO) Take 100 mcg by mouth 2 (two) times daily.     Marland Kitchen Respiratory Therapy Supplies (FLUTTER) DEVI Use as directed. 1 each 0  . TURMERIC PO Take by mouth 2 (two) times daily.    . meclizine (ANTIVERT) 25 MG tablet Take 1 tablet (25 mg total) by mouth 3 (three) times daily as needed for dizziness. (Patient not taking: Reported on 08/26/2015) 30 tablet 0   No facility-administered medications prior to visit.    ROS Review of Systems  Constitutional: Positive for fatigue. Negative for chills, activity change, appetite change and unexpected weight change.  HENT: Positive for congestion and rhinorrhea. Negative for ear pain, mouth sores and sinus  pressure.   Eyes: Negative for visual disturbance.  Respiratory: Negative for cough and chest tightness.   Gastrointestinal: Negative for nausea and abdominal pain.  Genitourinary: Negative for frequency, difficulty urinating and vaginal pain.  Musculoskeletal: Negative for back pain and gait problem.  Skin: Negative for pallor and rash.  Neurological: Positive for headaches. Negative for dizziness, tremors, weakness and numbness.  Psychiatric/Behavioral: Negative for confusion and sleep disturbance.    Objective:  BP 130/74 mmHg  Pulse 55  Temp(Src) 98 F (36.7 C) (Oral)  Wt 110 lb (49.896 kg)  SpO2 96%  BP Readings from Last 3 Encounters:  08/26/15 130/74  06/14/15 130/90  02/17/15 128/84    Wt Readings from Last 3 Encounters:  08/26/15 110 lb (49.896 kg)  06/14/15 108 lb 8 oz (49.215 kg)  02/17/15 106 lb 9.6 oz (48.353 kg)    Physical Exam  Constitutional: No distress.  Eyes: Right eye exhibits no discharge. Left eye exhibits no discharge. No scleral icterus.  Neck: No thyromegaly present.  Pulmonary/Chest: She has no wheezes. She has no rales.  Musculoskeletal: She exhibits no tenderness.  Neurological: She exhibits normal muscle tone.  eryth nasal lining   Lab Results  Component Value Date   WBC 6.6 12/09/2014   HGB 12.7 12/09/2014   HCT 37.9 12/09/2014   PLT 197.0 12/09/2014   GLUCOSE 87 05/28/2014   CHOL 233* 05/28/2014  TRIG 211.0* 05/28/2014   HDL 51.00 05/28/2014   LDLDIRECT 161.0 05/28/2014   ALT 12 05/28/2014   AST 18 05/28/2014   NA 142 05/28/2014   K 3.8 05/28/2014   CL 105 05/28/2014   CREATININE 0.68 05/28/2014   BUN 9 05/28/2014   CO2 32 05/28/2014   TSH 2.47 05/28/2014    Mm Digital Screening Bilateral  06/10/2015  CLINICAL DATA:  Screening. EXAM: DIGITAL SCREENING BILATERAL MAMMOGRAM WITH CAD COMPARISON:  Previous exam(s). ACR Breast Density Category c: The breast tissue is heterogeneously dense, which may obscure small masses.  FINDINGS: There are no findings suspicious for malignancy. Images were processed with CAD. IMPRESSION: No mammographic evidence of malignancy. A result letter of this screening mammogram will be mailed directly to the patient. RECOMMENDATION: Screening mammogram in one year. (Code:SM-B-01Y) BI-RADS CATEGORY  1: Negative. Electronically Signed   By: Margarette Canada M.D.   On: 06/10/2015 13:52    Assessment & Plan:   There are no diagnoses linked to this encounter. I am having Ms. Poncedeleon maintain her multivitamin with minerals, Vitamin B-12, Cholecalciferol, Pyridoxine HCl (VITAMIN B-6 PO), nadolol, fluticasone, butalbital-acetaminophen-caffeine, TURMERIC PO, Garlic, CRANBERRY FRUIT PO, FLUTTER, doxycycline, and meclizine.  No orders of the defined types were placed in this encounter.     Follow-up: No Follow-up on file.  Walker Kehr, MD

## 2015-11-07 ENCOUNTER — Other Ambulatory Visit (INDEPENDENT_AMBULATORY_CARE_PROVIDER_SITE_OTHER): Payer: 59

## 2015-11-07 ENCOUNTER — Encounter: Payer: Self-pay | Admitting: Internal Medicine

## 2015-11-07 ENCOUNTER — Ambulatory Visit (INDEPENDENT_AMBULATORY_CARE_PROVIDER_SITE_OTHER): Payer: 59 | Admitting: Internal Medicine

## 2015-11-07 VITALS — BP 118/72 | HR 55 | Ht 63.0 in | Wt 108.0 lb

## 2015-11-07 DIAGNOSIS — M899 Disorder of bone, unspecified: Secondary | ICD-10-CM

## 2015-11-07 DIAGNOSIS — M949 Disorder of cartilage, unspecified: Secondary | ICD-10-CM

## 2015-11-07 DIAGNOSIS — I10 Essential (primary) hypertension: Secondary | ICD-10-CM

## 2015-11-07 DIAGNOSIS — Z0000 Encounter for general adult medical examination without abnormal findings: Secondary | ICD-10-CM

## 2015-11-07 DIAGNOSIS — E538 Deficiency of other specified B group vitamins: Secondary | ICD-10-CM

## 2015-11-07 DIAGNOSIS — J4520 Mild intermittent asthma, uncomplicated: Secondary | ICD-10-CM

## 2015-11-07 DIAGNOSIS — Z23 Encounter for immunization: Secondary | ICD-10-CM | POA: Diagnosis not present

## 2015-11-07 LAB — URINALYSIS
BILIRUBIN URINE: NEGATIVE
Hgb urine dipstick: NEGATIVE
KETONES UR: NEGATIVE
LEUKOCYTES UA: NEGATIVE
Nitrite: NEGATIVE
PH: 8 (ref 5.0–8.0)
Specific Gravity, Urine: 1.01 (ref 1.000–1.030)
Total Protein, Urine: NEGATIVE
URINE GLUCOSE: NEGATIVE
Urobilinogen, UA: 0.2 (ref 0.0–1.0)

## 2015-11-07 LAB — CBC WITH DIFFERENTIAL/PLATELET
Basophils Absolute: 0 10*3/uL (ref 0.0–0.1)
Basophils Relative: 0.2 % (ref 0.0–3.0)
EOS ABS: 0.1 10*3/uL (ref 0.0–0.7)
Eosinophils Relative: 1.5 % (ref 0.0–5.0)
HCT: 39.1 % (ref 36.0–46.0)
Hemoglobin: 13.1 g/dL (ref 12.0–15.0)
Lymphocytes Relative: 28.8 % (ref 12.0–46.0)
Lymphs Abs: 1.8 10*3/uL (ref 0.7–4.0)
MCHC: 33.5 g/dL (ref 30.0–36.0)
MCV: 90.1 fl (ref 78.0–100.0)
Monocytes Absolute: 0.5 10*3/uL (ref 0.1–1.0)
Monocytes Relative: 8.5 % (ref 3.0–12.0)
Neutro Abs: 3.8 10*3/uL (ref 1.4–7.7)
Neutrophils Relative %: 61 % (ref 43.0–77.0)
Platelets: 193 10*3/uL (ref 150.0–400.0)
RBC: 4.34 Mil/uL (ref 3.87–5.11)
RDW: 13.4 % (ref 11.5–15.5)
WBC: 6.3 10*3/uL (ref 4.0–10.5)

## 2015-11-07 LAB — BASIC METABOLIC PANEL
BUN: 16 mg/dL (ref 6–23)
CO2: 33 meq/L — AB (ref 19–32)
Calcium: 9.8 mg/dL (ref 8.4–10.5)
Chloride: 101 mEq/L (ref 96–112)
Creatinine, Ser: 0.74 mg/dL (ref 0.40–1.20)
GFR: 83.83 mL/min (ref >60.00)
GLUCOSE: 91 mg/dL (ref 70–99)
POTASSIUM: 4.1 meq/L (ref 3.5–5.1)
SODIUM: 140 meq/L (ref 135–145)

## 2015-11-07 LAB — HEPATIC FUNCTION PANEL
ALBUMIN: 4.5 g/dL (ref 3.5–5.2)
ALK PHOS: 88 U/L (ref 39–117)
ALT: 13 U/L (ref 0–35)
AST: 20 U/L (ref 0–37)
Bilirubin, Direct: 0.1 mg/dL (ref 0.0–0.3)
TOTAL PROTEIN: 7.3 g/dL (ref 6.0–8.3)
Total Bilirubin: 0.5 mg/dL (ref 0.2–1.2)

## 2015-11-07 LAB — LIPID PANEL
Cholesterol: 248 mg/dL — ABNORMAL HIGH (ref 0–200)
HDL: 50.5 mg/dL (ref >39.00)
LDL Cholesterol: 163 mg/dL — ABNORMAL HIGH (ref 0–99)
NONHDL: 197.56
Total CHOL/HDL Ratio: 5
Triglycerides: 171 mg/dL — ABNORMAL HIGH (ref 0.0–149.0)
VLDL: 34.2 mg/dL (ref 0.0–40.0)

## 2015-11-07 LAB — TSH: TSH: 1.58 u[IU]/mL (ref 0.35–4.50)

## 2015-11-07 MED ORDER — NADOLOL 40 MG PO TABS: 20.0000 mg | ORAL_TABLET | Freq: Two times a day (BID) | ORAL | Status: DC

## 2015-11-07 MED ORDER — FLUTICASONE PROPIONATE 50 MCG/ACT NA SUSP: 1.0000 | Freq: Every day | NASAL | Status: DC | PRN

## 2015-11-07 MED ORDER — BUTALBITAL-APAP-CAFFEINE 50-325-40 MG PO TABS: 1.0000 | ORAL_TABLET | Freq: Four times a day (QID) | ORAL | Status: DC | PRN

## 2015-11-07 NOTE — Assessment & Plan Note (Signed)
On B12 

## 2015-11-07 NOTE — Assessment & Plan Note (Signed)
We discussed age appropriate health related issues, including available/recomended screening tests and vaccinations. We discussed a need for adhering to healthy diet and exercise. Labs/EKG were reviewed/ordered. All questions were answered.   

## 2015-11-07 NOTE — Assessment & Plan Note (Signed)
Nadolol Labs

## 2015-11-07 NOTE — Assessment & Plan Note (Signed)
Pt declined Rx Vit D and exercises

## 2015-11-07 NOTE — Progress Notes (Signed)
Subjective:  Patient ID: Lindsay Schultz, female    DOB: [birth year]  Age: 65 y.o. MRN: [id number]  CC: No chief complaint on file.   HPI Lindsay Schultz presents for a well exam  Outpatient Prescriptions Prior to Visit  Medication Sig Dispense Refill  . butalbital-acetaminophen-caffeine (FIORICET, ESGIC) 50-325-40 MG per tablet Take 1 tablet by mouth every 6 (six) hours as needed for headache. 100 tablet 2  . Cholecalciferol 2000 UNITS CAPS Take by mouth daily.    Marland Kitchen CRANBERRY FRUIT PO Take 1 each by mouth daily.    . Cyanocobalamin (VITAMIN B-12) 500 MCG SUBL 1 tab sl qd (Patient taking differently: Take 500 tablets by mouth 2 (two) times daily. ) 100 tablet 11  . fluconazole (DIFLUCAN) 150 MG tablet Take 1 tablet (150 mg total) by mouth once. 1 tablet 1  . fluticasone (FLONASE) 50 MCG/ACT nasal spray Place 1 spray into both nostrils daily. (Patient taking differently: Place 1 spray into both nostrils daily as needed. ) 16 g 11  . Garlic 123XX123 MG CAPS Take 1 capsule by mouth daily.    . meclizine (ANTIVERT) 25 MG tablet Take 1 tablet (25 mg total) by mouth 3 (three) times daily as needed for dizziness. 30 tablet 0  . Multiple Vitamins-Minerals (MULTIVITAMIN WITH MINERALS) tablet Take 1 tablet by mouth daily.    . nadolol (CORGARD) 40 MG tablet Take 0.5 tablets (20 mg total) by mouth 2 (two) times daily. 60 tablet 11  . Pyridoxine HCl (VITAMIN B-6 PO) Take 100 mcg by mouth 2 (two) times daily.     Marland Kitchen Respiratory Therapy Supplies (FLUTTER) DEVI Use as directed. 1 each 0  . TURMERIC PO Take by mouth 2 (two) times daily.    . cefdinir (OMNICEF) 300 MG capsule Take 1 capsule (300 mg total) by mouth 2 (two) times daily. (Patient not taking: Reported on 11/07/2015) 20 capsule 0   No facility-administered medications prior to visit.    ROS Review of Systems  Constitutional: Negative for chills, activity change, appetite change, fatigue and unexpected weight change.  HENT: Negative for congestion, mouth  sores and sinus pressure.   Eyes: Negative for visual disturbance.  Respiratory: Negative for cough and chest tightness.   Gastrointestinal: Negative for nausea and abdominal pain.  Genitourinary: Negative for frequency, difficulty urinating and vaginal pain.  Musculoskeletal: Negative for back pain and gait problem.  Skin: Negative for pallor and rash.  Neurological: Negative for dizziness, tremors, weakness, numbness and headaches.  Psychiatric/Behavioral: Negative for suicidal ideas, confusion, sleep disturbance and dysphoric mood.    Objective:  BP 118/72 mmHg  Pulse 55  Ht 5\' 3"  (1.6 m)  Wt 108 lb (48.988 kg)  BMI 19.14 kg/m2  SpO2 97%  BP Readings from Last 3 Encounters:  11/07/15 118/72  08/26/15 130/74  06/14/15 130/90    Wt Readings from Last 3 Encounters:  11/07/15 108 lb (48.988 kg)  08/26/15 110 lb (49.896 kg)  06/14/15 108 lb 8 oz (49.215 kg)    Physical Exam  Constitutional: She appears well-developed. No distress.  HENT:  Head: Normocephalic.  Right Ear: External ear normal.  Left Ear: External ear normal.  Nose: Nose normal.  Mouth/Throat: Oropharynx is clear and moist.  Eyes: Conjunctivae are normal. Pupils are equal, round, and reactive to light. Right eye exhibits no discharge. Left eye exhibits no discharge.  Neck: Normal range of motion. Neck supple. No JVD present. No tracheal deviation present. No thyromegaly present.  Cardiovascular: Normal rate,  regular rhythm and normal heart sounds.   Pulmonary/Chest: No stridor. No respiratory distress. She has no wheezes.  Abdominal: Soft. Bowel sounds are normal. She exhibits no distension and no mass. There is no tenderness. There is no rebound and no guarding.  Musculoskeletal: She exhibits no edema or tenderness.  Lymphadenopathy:    She has no cervical adenopathy.  Neurological: She displays normal reflexes. No cranial nerve deficit. She exhibits normal muscle tone. Coordination normal.  Skin: No rash  noted. No erythema.  Psychiatric: She has a normal mood and affect. Her behavior is normal. Judgment and thought content normal.    Lab Results  Component Value Date   WBC 6.6 12/09/2014   HGB 12.7 12/09/2014   HCT 37.9 12/09/2014   PLT 197.0 12/09/2014   GLUCOSE 87 05/28/2014   CHOL 233* 05/28/2014   TRIG 211.0* 05/28/2014   HDL 51.00 05/28/2014   LDLDIRECT 161.0 05/28/2014   ALT 12 05/28/2014   AST 18 05/28/2014   NA 142 05/28/2014   K 3.8 05/28/2014   CL 105 05/28/2014   CREATININE 0.68 05/28/2014   BUN 9 05/28/2014   CO2 32 05/28/2014   TSH 2.47 05/28/2014    Mm Digital Screening Bilateral  06/10/2015  CLINICAL DATA:  Screening. EXAM: DIGITAL SCREENING BILATERAL MAMMOGRAM WITH CAD COMPARISON:  Previous exam(s). ACR Breast Density Category c: The breast tissue is heterogeneously dense, which may obscure small masses. FINDINGS: There are no findings suspicious for malignancy. Images were processed with CAD. IMPRESSION: No mammographic evidence of malignancy. A result letter of this screening mammogram will be mailed directly to the patient. RECOMMENDATION: Screening mammogram in one year. (Code:SM-B-01Y) BI-RADS CATEGORY  1: Negative. Electronically Signed   By: Margarette Canada M.D.   On: 06/10/2015 13:52    Assessment & Plan:   There are no diagnoses linked to this encounter. I have discontinued Ms. Calabretta's cefdinir. I am also having her maintain her multivitamin with minerals, Vitamin B-12, Cholecalciferol, Pyridoxine HCl (VITAMIN B-6 PO), nadolol, fluticasone, butalbital-acetaminophen-caffeine, TURMERIC PO, Garlic, CRANBERRY FRUIT PO, FLUTTER, meclizine, and fluconazole.  No orders of the defined types were placed in this encounter.     Follow-up: No Follow-up on file.  Walker Kehr, MD

## 2015-11-07 NOTE — Progress Notes (Signed)
Pre visit review using our clinic review tool, if applicable. No additional management support is needed unless otherwise documented below in the visit note. 

## 2015-11-08 LAB — HEPATITIS C ANTIBODY: HCV Ab: NEGATIVE

## 2015-11-13 ENCOUNTER — Ambulatory Visit (INDEPENDENT_AMBULATORY_CARE_PROVIDER_SITE_OTHER)
Admission: RE | Admit: 2015-11-13 | Discharge: 2015-11-13 | Disposition: A | Discharge status: Home / Self Care | Payer: 59 | Source: Ambulatory Visit | Attending: Internal Medicine | Admitting: Internal Medicine

## 2015-11-13 DIAGNOSIS — M899 Disorder of bone, unspecified: Secondary | ICD-10-CM

## 2015-11-13 DIAGNOSIS — Z13820 Encounter for screening for osteoporosis: Secondary | ICD-10-CM

## 2015-11-13 DIAGNOSIS — Z0000 Encounter for general adult medical examination without abnormal findings: Secondary | ICD-10-CM

## 2015-11-13 DIAGNOSIS — M949 Disorder of cartilage, unspecified: Secondary | ICD-10-CM | POA: Diagnosis not present

## 2016-01-12 ENCOUNTER — Encounter: Payer: Self-pay | Admitting: Pulmonary Disease

## 2016-01-12 ENCOUNTER — Ambulatory Visit (INDEPENDENT_AMBULATORY_CARE_PROVIDER_SITE_OTHER): Payer: 59 | Admitting: Pulmonary Disease

## 2016-01-12 ENCOUNTER — Ambulatory Visit (INDEPENDENT_AMBULATORY_CARE_PROVIDER_SITE_OTHER)
Admission: RE | Admit: 2016-01-12 | Discharge: 2016-01-12 | Disposition: A | Discharge status: Home / Self Care | Payer: 59 | Source: Ambulatory Visit | Attending: Pulmonary Disease | Admitting: Pulmonary Disease

## 2016-01-12 ENCOUNTER — Telehealth: Payer: Self-pay

## 2016-01-12 DIAGNOSIS — J471 Bronchiectasis with (acute) exacerbation: Secondary | ICD-10-CM

## 2016-01-12 DIAGNOSIS — J301 Allergic rhinitis due to pollen: Secondary | ICD-10-CM | POA: Diagnosis not present

## 2016-01-12 MED ORDER — DOXYCYCLINE HYCLATE 100 MG PO TABS: 100.0000 mg | ORAL_TABLET | Freq: Two times a day (BID) | ORAL | 0 refills | Status: AC

## 2016-01-12 NOTE — Progress Notes (Signed)
History of Present Illness Lindsay Schultz is a 65 y.o. female never smoker with history of asthmatic bronchitis, bronchiectasis, sinusitis, and seasonal allergies followed by Dr. Vaughan Browner.   01/12/2016 Acute OV for Hemoptysis:  Pt. Presents for worsening cough that evolved into an episode of hemoptysis. She states that she had a really bad coughing episode that started at 7 pm last night and resulted in coughing up bright red blood. She states there were no clots in the blood.and estimated it was about 60 cc's between 7 pm and 11 pm last night. She has not had any more bloody mucus, but still tasted the blood in her mouth with cough. She states that her allergies have been worse and that is causing sinus pressure. She has not had a chest cold prior to this episode.This is the first time she has had an episode of hemoptysis with her bronchiectasis.She is not using her flutter valve as she does not feel it helps. She has not been using her Flonase, but does use saline for her sinuses..   Tests CT Chest 10/2014  IMPRESSION: 1. Multiple nodules in the left upper lobe. 2. Multiple areas of bronchiectasis with soft tissue filling the distal dilated bronchi bilaterally. The endobronchial lesions or mucoid impactions and could create these findings. 3. Findings of previous granulomatous disease. 4. I suspect the nodules and bronchial impactions are benign inflammatory changes rather than metastatic disease.   Past medical hx Past Medical History:  Diagnosis Date  . Allergic rhinitis   . Benign paroxysmal positional vertigo 06/14/2015  . GERD (gastroesophageal reflux disease)   . HTN (hypertension)   . Hx of breast cancer   . Osteopenia      Past surgical hx, Family hx, Social hx all reviewed.  Current Outpatient Prescriptions on File Prior to Visit  Medication Sig  . butalbital-acetaminophen-caffeine (FIORICET, ESGIC) 50-325-40 MG tablet Take 1 tablet by mouth every 6 (six) hours as needed  for headache.  . Cholecalciferol 2000 UNITS CAPS Take by mouth daily.  Marland Kitchen CRANBERRY FRUIT PO Take 1 each by mouth daily.  . Cyanocobalamin (VITAMIN B-12) 500 MCG SUBL 1 tab sl qd (Patient taking differently: Take 500 tablets by mouth 2 (two) times daily. )  . fluticasone (FLONASE) 50 MCG/ACT nasal spray Place 1 spray into both nostrils daily as needed.  . Garlic 123XX123 MG CAPS Take 1 capsule by mouth daily.  . Multiple Vitamins-Minerals (MULTIVITAMIN WITH MINERALS) tablet Take 1 tablet by mouth daily.  . nadolol (CORGARD) 40 MG tablet Take 0.5 tablets (20 mg total) by mouth 2 (two) times daily.  . Pyridoxine HCl (VITAMIN B-6 PO) Take 100 mcg by mouth 2 (two) times daily.   . TURMERIC PO Take by mouth 2 (two) times daily.  Marland Kitchen Respiratory Therapy Supplies (FLUTTER) DEVI Use as directed. (Patient not taking: Reported on 01/12/2016)   No current facility-administered medications on file prior to visit.      Allergies  Allergen Reactions  . Ciprofloxacin     REACTION: rash along the vein w/IV Cipro  . Clarithromycin     REACTION: rash  . Meloxicam     Upset stomach  . Paroxetine     Passed out  . Penicillins     REACTION: passed out after a shot at 65 y.o  . Zithromax [Azithromycin] Rash    Review Of Systems:  Constitutional:   No  weight loss, night sweats,  Fevers, chills, fatigue, or  lassitude.  HEENT:   No headaches,  Difficulty swallowing,  Tooth/dental problems, or  Sore throat,                No sneezing, itching, ear ache, + nasal congestion, no post nasal drip,   CV:  No chest pain,  Orthopnea, PND, swelling in lower extremities, anasarca, dizziness, palpitations, syncope.   GI  No heartburn, indigestion, abdominal pain, nausea, vomiting, diarrhea, change in bowel habits, loss of appetite, bloody stools.   Resp: No shortness of breath with exertion or at rest.  No excess mucus, no productive cough,  No non-productive cough,  + coughing up of blood.  No change in color of  mucus.  No wheezing.  No chest wall deformity  Skin: no rash or lesions.  GU: no dysuria, change in color of urine, no urgency or frequency.  No flank pain, no hematuria   MS:  No joint pain or swelling.  No decreased range of motion.  No back pain.  Psych:  No change in mood or affect. No depression or anxiety.  No memory loss.   Vital Signs BP 102/68 (BP Location: Left Arm, Cuff Size: Normal)   Pulse 62   Ht 5\' 3"  (1.6 m)   Wt 109 lb (49.4 kg)   SpO2 98%   BMI 19.31 kg/m    Physical Exam:  General- No distress,  A&Ox3, pleasant ENT: No sinus tenderness, TM clear, pale nasal mucosa, no oral exudate,no post nasal drip, no LAN Cardiac: S1, S2, regular rate and rhythm, no murmur Chest: No wheeze/ rales/ dullness; no accessory muscle use, no nasal flaring, no sternal retractions Abd.: Soft Non-tender Ext: No clubbing cyanosis, edema Neuro:  normal strength Skin: No rashes, warm and dry Psych: normal mood and behavior   Assessment/Plan  Bronchiectasis without acute exacerbation (HCC) Episode of hemoptysis which self resolved. Plan: We will do a CXR today. We will call you with the results Delsym 5 ml three times daily for 5-7 days for cough Doxycycline 100 mg 2 times daily x 5 days. Follow up with Dr. Elsworth Soho or NP in 1 week. Please contact office for sooner follow up if symptoms do not improve or worsen or seek emergency care    Allergic rhinitis Continue using your Flonase and saline mist as needed for allergies.    [name], NP 01/12/2016  4:18 PM    She resents with one episode of bright red hemoptysis yesterday, painless not preceded by URI symptoms. Exam shows clear lungs and no evidence of bleeding from upper airway including nose or mouth.  She's not on any anti-Coagulation CT chest from 2016 was reviewed which shows mild bronchiectasis with mucus impaction  Recommend-chest x-ray today Doxycycline for 5 days Cough suppressant-start with Delsym 3  times a day for 7 days, if she has breakthrough cough that this may need codeine containing Hope that this will be self-limited If this recurs, may need CT imaging and bronchoscopy    Rigoberto Noel. MD

## 2016-01-12 NOTE — Telephone Encounter (Signed)
RA reviewed pt's cxr images and states that they are clear. Nothing to indicate reason for hemoptysis. Please remind pt to finish abx and follow up in one week.  LMTCB

## 2016-01-12 NOTE — Assessment & Plan Note (Signed)
Continue using your Flonase and saline mist as needed for allergies.

## 2016-01-12 NOTE — Assessment & Plan Note (Signed)
Episode of hemoptysis which self resolved. Plan: We will do a CXR today. We will call you with the results Delsym 5 ml three times daily for 5-7 days for cough Doxycycline 100 mg 2 times daily x 5 days. Follow up with Dr. Elsworth Soho or NP in 1 week. Please contact office for sooner follow up if symptoms do not improve or worsen or seek emergency care

## 2016-01-12 NOTE — Patient Instructions (Addendum)
It is nice to meet you today. We will do a CXR today. We will call you with the results Delsym 5 ml three times daily for 5-7 days for cough Doxycycline 100 mg 2 times daily x 5 days. Follow up with Dr. Elsworth Soho or NP in 1 week. Please contact office for sooner follow up if symptoms do not improve or worsen or seek emergency care

## 2016-01-13 NOTE — Telephone Encounter (Signed)
Patient returned call - she can be reached at [phone]

## 2016-01-14 NOTE — Telephone Encounter (Signed)
Pt returning call again for results, and can be reached @ the same # please advise.Lindsay Schultz

## 2016-01-14 NOTE — Telephone Encounter (Signed)
I spoke with patient about results and she verbalized understanding and had no questions 

## 2016-01-21 ENCOUNTER — Telehealth: Payer: Self-pay | Admitting: Internal Medicine

## 2016-01-21 NOTE — Telephone Encounter (Signed)
Patient states she had a missed call from [place].  Do not see phone note in system.  Please return call.

## 2016-01-21 NOTE — Telephone Encounter (Signed)
Pt wants to know if she can use Miacalcin nasal spray for her Osteoporois instead. Please advise.

## 2016-01-21 NOTE — Telephone Encounter (Signed)
Pt informed of below. She wants to read up on the medications mentioned. She will either call us back or discuss with PCP at her next OV.   Notes Recorded by Cresenciano Lick, CMA on 01/20/2016 at 2:28 PM EDT Left mess for patient to call back. ------  Notes Recorded by Cassandria Anger, MD on 01/19/2016 at 9:29 PM EDT Erline Levine,  Please inform patient that she has a bone loss (osteoporosis) on a BDS We can start her on a medication (like Boniva) or injections q 6 mo (Prolia) - let me know. Thx

## 2016-01-21 NOTE — Telephone Encounter (Signed)
OK but it is less effective. Do I need to email a RX? Thx

## 2016-01-21 NOTE — Telephone Encounter (Signed)
Patient called in and wanted to let Erline Levine know which medication she was on before. It was Miacalcin nasal spray. Thank you.

## 2016-01-22 MED ORDER — CALCITONIN (SALMON) 200 UNIT/ACT NA SOLN: 1.0000 | Freq: Every day | NASAL | 11 refills | Status: DC

## 2016-01-22 NOTE — Telephone Encounter (Signed)
Ok Thx 

## 2016-01-22 NOTE — Telephone Encounter (Signed)
Pt informed -please send new Rx.

## 2016-01-24 ENCOUNTER — Emergency Department (HOSPITAL_COMMUNITY): Payer: 59

## 2016-01-24 ENCOUNTER — Inpatient Hospital Stay (HOSPITAL_COMMUNITY)
Admission: EM | Admit: 2016-01-24 | Discharge: 2016-01-27 | DRG: 191 | Disposition: A | Discharge status: Home / Self Care | Payer: 59 | Attending: Internal Medicine | Admitting: Internal Medicine

## 2016-01-24 ENCOUNTER — Encounter (HOSPITAL_COMMUNITY): Payer: Self-pay

## 2016-01-24 ENCOUNTER — Telehealth: Payer: Self-pay

## 2016-01-24 DIAGNOSIS — Z8249 Family history of ischemic heart disease and other diseases of the circulatory system: Secondary | ICD-10-CM

## 2016-01-24 DIAGNOSIS — Z9221 Personal history of antineoplastic chemotherapy: Secondary | ICD-10-CM | POA: Diagnosis not present

## 2016-01-24 DIAGNOSIS — Z923 Personal history of irradiation: Secondary | ICD-10-CM

## 2016-01-24 DIAGNOSIS — Z79899 Other long term (current) drug therapy: Secondary | ICD-10-CM

## 2016-01-24 DIAGNOSIS — I10 Essential (primary) hypertension: Secondary | ICD-10-CM | POA: Diagnosis present

## 2016-01-24 DIAGNOSIS — J471 Bronchiectasis with (acute) exacerbation: Secondary | ICD-10-CM

## 2016-01-24 DIAGNOSIS — G500 Trigeminal neuralgia: Secondary | ICD-10-CM | POA: Diagnosis present

## 2016-01-24 DIAGNOSIS — J470 Bronchiectasis with acute lower respiratory infection: Secondary | ICD-10-CM | POA: Diagnosis present

## 2016-01-24 DIAGNOSIS — Z888 Allergy status to other drugs, medicaments and biological substances status: Secondary | ICD-10-CM | POA: Diagnosis not present

## 2016-01-24 DIAGNOSIS — R042 Hemoptysis: Secondary | ICD-10-CM | POA: Diagnosis present

## 2016-01-24 DIAGNOSIS — Z853 Personal history of malignant neoplasm of breast: Secondary | ICD-10-CM | POA: Diagnosis not present

## 2016-01-24 DIAGNOSIS — Z880 Allergy status to penicillin: Secondary | ICD-10-CM

## 2016-01-24 DIAGNOSIS — T380X5A Adverse effect of glucocorticoids and synthetic analogues, initial encounter: Secondary | ICD-10-CM | POA: Diagnosis present

## 2016-01-24 DIAGNOSIS — R918 Other nonspecific abnormal finding of lung field: Secondary | ICD-10-CM | POA: Diagnosis present

## 2016-01-24 DIAGNOSIS — J45909 Unspecified asthma, uncomplicated: Secondary | ICD-10-CM | POA: Diagnosis present

## 2016-01-24 DIAGNOSIS — D72829 Elevated white blood cell count, unspecified: Secondary | ICD-10-CM | POA: Diagnosis not present

## 2016-01-24 DIAGNOSIS — K219 Gastro-esophageal reflux disease without esophagitis: Secondary | ICD-10-CM | POA: Diagnosis present

## 2016-01-24 HISTORY — DX: Bronchiectasis, uncomplicated: J47.9

## 2016-01-24 LAB — CBC WITH DIFFERENTIAL/PLATELET
BASOS PCT: 0 %
Basophils Absolute: 0 10*3/uL (ref 0.0–0.1)
Eosinophils Absolute: 0.1 10*3/uL (ref 0.0–0.7)
Eosinophils Relative: 2 %
HEMATOCRIT: 40.6 % (ref 36.0–46.0)
Hemoglobin: 13.4 g/dL (ref 12.0–15.0)
Lymphocytes Relative: 26 %
Lymphs Abs: 2.1 10*3/uL (ref 0.7–4.0)
MCH: 30.4 pg (ref 26.0–34.0)
MCHC: 33 g/dL (ref 30.0–36.0)
MCV: 92.1 fL (ref 78.0–100.0)
MONO ABS: 0.5 10*3/uL (ref 0.1–1.0)
MONOS PCT: 7 %
NEUTROS ABS: 5.2 10*3/uL (ref 1.7–7.7)
Neutrophils Relative %: 65 %
Platelets: 200 10*3/uL (ref 150–400)
RBC: 4.41 MIL/uL (ref 3.87–5.11)
RDW: 12.7 % (ref 11.5–15.5)
WBC: 8 10*3/uL (ref 4.0–10.5)

## 2016-01-24 LAB — COMPREHENSIVE METABOLIC PANEL
ALBUMIN: 4.7 g/dL (ref 3.5–5.0)
ALT: 17 U/L (ref 14–54)
ANION GAP: 8 (ref 5–15)
AST: 27 U/L (ref 15–41)
Alkaline Phosphatase: 95 U/L (ref 38–126)
BILIRUBIN TOTAL: 0.9 mg/dL (ref 0.3–1.2)
BUN: 13 mg/dL (ref 6–20)
CO2: 29 mmol/L (ref 22–32)
Calcium: 9.8 mg/dL (ref 8.9–10.3)
Chloride: 103 mmol/L (ref 101–111)
Creatinine, Ser: 0.72 mg/dL (ref 0.44–1.00)
Glucose, Bld: 90 mg/dL (ref 65–99)
POTASSIUM: 3.7 mmol/L (ref 3.5–5.1)
Sodium: 140 mmol/L (ref 135–145)
TOTAL PROTEIN: 7.9 g/dL (ref 6.5–8.1)

## 2016-01-24 LAB — EXPECTORATED SPUTUM ASSESSMENT W GRAM STAIN, RFLX TO RESP C

## 2016-01-24 LAB — EXPECTORATED SPUTUM ASSESSMENT W REFEX TO RESP CULTURE: SPECIAL REQUESTS: NORMAL

## 2016-01-24 LAB — MRSA PCR SCREENING: MRSA by PCR: NEGATIVE

## 2016-01-24 LAB — D-DIMER, QUANTITATIVE (NOT AT ARMC): D DIMER QUANT: 0.56 ug{FEU}/mL — AB (ref 0.00–0.50)

## 2016-01-24 MED ORDER — SODIUM CHLORIDE 0.9% FLUSH
3.0000 mL | Freq: Two times a day (BID) | INTRAVENOUS | Status: DC
  Administered 2016-01-25 – 2016-01-27 (×5): 3 mL via INTRAVENOUS

## 2016-01-24 MED ORDER — ALBUTEROL SULFATE (2.5 MG/3ML) 0.083% IN NEBU: 2.5000 mg | INHALATION_SOLUTION | RESPIRATORY_TRACT | Status: DC | PRN

## 2016-01-24 MED ORDER — DEXTROSE 5 % IV SOLN
1.0000 g | Freq: Three times a day (TID) | INTRAVENOUS | Status: DC
  Administered 2016-01-24 – 2016-01-27 (×8): 1 g via INTRAVENOUS
  Filled 2016-01-24 (×10): qty 1

## 2016-01-24 MED ORDER — ACETAMINOPHEN 325 MG PO TABS
650.0000 mg | ORAL_TABLET | Freq: Four times a day (QID) | ORAL | Status: DC | PRN
  Administered 2016-01-25 – 2016-01-26 (×2): 650 mg via ORAL
  Filled 2016-01-24 (×2): qty 2

## 2016-01-24 MED ORDER — GUAIFENESIN ER 600 MG PO TB12
600.0000 mg | ORAL_TABLET | Freq: Two times a day (BID) | ORAL | Status: DC
  Administered 2016-01-24 – 2016-01-27 (×6): 600 mg via ORAL
  Filled 2016-01-24 (×6): qty 1

## 2016-01-24 MED ORDER — ACETAMINOPHEN 650 MG RE SUPP
650.0000 mg | Freq: Four times a day (QID) | RECTAL | Status: DC | PRN
Start: 2016-01-24 — End: 2016-01-26

## 2016-01-24 MED ORDER — HYDROCODONE-ACETAMINOPHEN 5-325 MG PO TABS: 1.0000 | ORAL_TABLET | ORAL | Status: DC | PRN

## 2016-01-24 MED ORDER — NADOLOL 20 MG PO TABS
20.0000 mg | ORAL_TABLET | Freq: Two times a day (BID) | ORAL | Status: DC
  Administered 2016-01-24 – 2016-01-27 (×6): 20 mg via ORAL
  Filled 2016-01-24 (×6): qty 1

## 2016-01-24 MED ORDER — DOXYCYCLINE HYCLATE 100 MG PO TABS
100.0000 mg | ORAL_TABLET | Freq: Two times a day (BID) | ORAL | Status: DC
  Administered 2016-01-24 – 2016-01-27 (×6): 100 mg via ORAL
  Filled 2016-01-24 (×6): qty 1

## 2016-01-24 MED ORDER — ONDANSETRON HCL 4 MG PO TABS: 4.0000 mg | ORAL_TABLET | Freq: Four times a day (QID) | ORAL | Status: DC | PRN

## 2016-01-24 MED ORDER — IOPAMIDOL (ISOVUE-370) INJECTION 76%
100.0000 mL | Freq: Once | INTRAVENOUS | Status: AC | PRN
  Administered 2016-01-24: 100 mL via INTRAVENOUS

## 2016-01-24 MED ORDER — SODIUM CHLORIDE 0.9 % IV SOLN
INTRAVENOUS | Status: DC
  Administered 2016-01-24: 22:00:00 via INTRAVENOUS

## 2016-01-24 MED ORDER — ONDANSETRON HCL 4 MG/2ML IJ SOLN: 4.0000 mg | Freq: Four times a day (QID) | INTRAMUSCULAR | Status: DC | PRN

## 2016-01-24 NOTE — ED Notes (Signed)
Pulmonologist at bedside

## 2016-01-24 NOTE — H&P (Addendum)
Lindsay Schultz [id number] DOB: 07/27/1950 DOA: 01/24/2016     PCP: Walker Kehr, MD   Outpatient Specialists: Pulmonology [facility] Patient coming from:    home Lives   With family   Chief Complaint: Hemoptysis  HPI: Lindsay Schultz is a 65 y.o. female with medical history significant of bronchiectasis, HTN, trigeminal Neuralgia    Presented with intermittent hemoptysis for the past 1.5 weeks patient has been seen for the same by pulmonology clinic on 25th of September. She was prescribed Delsym and doxycycline with plan to follow up with pulmonology in one week.  Plan was for patient to have outpatient CT and bronchoscopy for symptoms did not resolve. Patient continued to have intermittent hemoptysis and presented to emergency department. Patient states she completed her 5 day course of doxycycline. After completion of antibiotics or hemoptysis. Resume she's coughing bright red blood. About a tablespoon at a time he has been getting worse from baseline. She did this symptoms associated shortness of breath chest pain which is worse with respirations and coughing.  She reports severe shortness of breath once coughing and hemoptysis start she has at least 3-4 episodes at home and 2 while in ER. She is not on oxygen at home Regarding pertinent Chronic problems: chronic heaadache for which she takes Blende.  She on Nadol for hx of HTN. She has hx of remote breast cancer sp radiation treatments and lumpectomy 10 years ago   IN ER:  Temp (24hrs), Avg:98 F (36.7 C), Min:97.8 F (36.6 C), Max:98.2 F (36.8 C) Examination continued to have hemoptysis satting 100% on room air pulse 61 blood pressure 160/65 WBC 8.0 hemoglobin 13.4 D-dimer 0.56 Chest x-ray showing hyperdensity symmetry of the line right lung apex recommend CT Following Medications were ordered in ER: Medications - No data to display   ER provider discussed case with:  Pulmonology who recommends admission to  medicine  Hospitalist was called for admission for hemoptysis  Review of Systems:    Pertinent positives include: chest pain shortness of breath at rest.  dyspnea on exertion productive cough,, coughing up of blood. Constitutional:  No weight loss, night sweats, Fevers, chills, fatigue, weight loss  HEENT:  No headaches, Difficulty swallowing,Tooth/dental problems,Sore throat,  No sneezing, itching, ear ache, nasal congestion, post nasal drip,  Cardio-vascular:  No , Orthopnea, PND, anasarca, dizziness, palpitations.no Bilateral lower extremity swelling  GI:  No heartburn, indigestion, abdominal pain, nausea, vomiting, diarrhea, change in bowel habits, loss of appetite, melena, blood in stool, hematemesis Resp:  no , No excess mucus, no , No non-productive cough, No No change in color of mucus.No wheezing. Skin:  no rash or lesions. No jaundice GU:  no dysuria, change in color of urine, no urgency or frequency. No straining to urinate.  No flank pain.  Musculoskeletal:  No joint pain or no joint swelling. No decreased range of motion. No back pain.  Psych:  No change in mood or affect. No depression or anxiety. No memory loss.  Neuro: no localizing neurological complaints, no tingling, no weakness, no double vision, no gait abnormality, no slurred speech, no confusion  As per HPI otherwise 10 point review of systems negative.   Past Medical History: Past Medical History:  Diagnosis Date  . Allergic rhinitis   . Benign paroxysmal positional vertigo 06/14/2015  . Bronchiectasis ([place])   . GERD (gastroesophageal reflux disease)   . HTN (hypertension)   . Hx of breast cancer   . Osteopenia    Past  Surgical History:  Procedure Laterality Date  . ABDOMINAL HYSTERECTOMY    . APPENDECTOMY    . BREAST LUMPECTOMY  2008   Left  . NASAL SINUS SURGERY  1997     Social History:  Ambulatory  Independently     reports that she has never smoked. She has never used smokeless  tobacco. She reports that she does not drink alcohol or use drugs.  Allergies:   Allergies  Allergen Reactions  . Ciprofloxacin     REACTION: rash along the vein w/IV Cipro  . Clarithromycin     REACTION: rash  . Meloxicam     Upset stomach  . Paroxetine     Passed out  . Penicillins     REACTION: passed out after a shot at 65 y.o  . Zithromax [Azithromycin] Rash       Family History:   Family History  Problem Relation Age of Onset  . Sjogren's syndrome Mother   . Arthritis Father 17    RA  . Emphysema Father   . Heart disease Father   . Hypertension Other   . Hodgkin's lymphoma Brother     Medications: Prior to Admission medications   Medication Sig Start Date End Date Taking? Authorizing Provider  butalbital-acetaminophen-caffeine (FIORICET, ESGIC) [phone] MG tablet Take 1 tablet by mouth every 6 (six) hours as needed for headache. 11/07/15   Cassandria Anger, MD  calcitonin, salmon, (MIACALCIN/FORTICAL) 200 UNIT/ACT nasal spray Place 1 spray into alternate nostrils daily. 01/22/16   Cassandria Anger, MD  Cholecalciferol 2000 UNITS CAPS Take by mouth daily.    Historical Provider, MD  CRANBERRY FRUIT PO Take 1 each by mouth daily.    Historical Provider, MD  Cyanocobalamin (VITAMIN B-12) 500 MCG SUBL 1 tab sl qd Patient taking differently: Take 500 tablets by mouth 2 (two) times daily.  08/23/12   Evie Lacks Plotnikov, MD  fluticasone (FLONASE) 50 MCG/ACT nasal spray Place 1 spray into both nostrils daily as needed. 11/07/15   Evie Lacks Plotnikov, MD  Garlic [phone] MG CAPS Take 1 capsule by mouth daily.    Historical Provider, MD  Multiple Vitamins-Minerals (MULTIVITAMIN WITH MINERALS) tablet Take 1 tablet by mouth daily.    Historical Provider, MD  nadolol (CORGARD) 40 MG tablet Take 0.5 tablets (20 mg total) by mouth 2 (two) times daily. 11/07/15   Evie Lacks Plotnikov, MD  Pyridoxine HCl (VITAMIN B-6 PO) Take 100 mcg by mouth 2 (two) times daily.     Historical  Provider, MD  Respiratory Therapy Supplies (FLUTTER) DEVI Use as directed. Patient not taking: Reported on 01/12/2016 02/18/15   Marshell Garfinkel, MD  TURMERIC PO Take by mouth 2 (two) times daily.    Historical Provider, MD    Physical Exam: Patient Vitals for the past 24 hrs:  BP Temp Temp src Pulse Resp SpO2 Height Weight  01/24/16 1803 142/72 98.2 F (36.8 C) Oral 64 18 100 % - -  01/24/16 1529 - - - - - - 5\' 3"  (1.6 m) 49.4 kg (109 lb)  01/24/16 1526 154/64 98 F (36.7 C) Oral (!) 58 17 100 % - -  01/24/16 1340 160/78 97.8 F (36.6 C) Oral 60 18 99 % - 49.4 kg (109 lb)    1. General:  in No Acute distress 2. Psychological: Alert and  Oriented 3. Head/ENT:   Moist  Mucous Membranes  Head Non traumatic, neck supple                          Normal  Dentition 4. SKIN decreased Skin turgor,  Skin clean Dry and intact no rash 5. Heart: Regular rate and rhythm no  Murmur, Rub or gallop 6. Lungs:  no wheezes some crackles   7. Abdomen: Soft,  non-tender, Non distended 8. Lower extremities: no clubbing, cyanosis, or edema 9. Neurologically Grossly intact, moving all 4 extremities equally  10. MSK: Normal range of motion   body mass index is 19.31 kg/m.  Labs on Admission:   Labs on Admission: I have personally reviewed following labs and imaging studies  CBC:  Recent Labs Lab 01/24/16 1618  WBC 8.0  NEUTROABS 5.2  HGB 13.4  HCT 40.6  MCV 92.1  PLT [id number]   Basic Metabolic Panel:  Recent Labs Lab 01/24/16 1618  NA 140  K 3.7  CL 103  CO2 29  GLUCOSE 90  BUN 13  CREATININE 0.72  CALCIUM 9.8   GFR: Estimated Creatinine Clearance: 55.4 mL/min (by C-G formula based on SCr of 0.72 mg/dL). Liver Function Tests:  Recent Labs Lab 01/24/16 1618  AST 27  ALT 17  ALKPHOS 95  BILITOT 0.9  PROT 7.9  ALBUMIN 4.7   No results for input(s): LIPASE, AMYLASE in the last 168 hours. No results for input(s): AMMONIA in the last 168  hours. Coagulation Profile: No results for input(s): INR, PROTIME in the last 168 hours. Cardiac Enzymes: No results for input(s): CKTOTAL, CKMB, CKMBINDEX, TROPONINI in the last 168 hours. BNP (last 3 results) No results for input(s): PROBNP in the last 8760 hours. HbA1C: No results for input(s): HGBA1C in the last 72 hours. CBG: No results for input(s): GLUCAP in the last 168 hours. Lipid Profile: No results for input(s): CHOL, HDL, LDLCALC, TRIG, CHOLHDL, LDLDIRECT in the last 72 hours. Thyroid Function Tests: No results for input(s): TSH, T4TOTAL, FREET4, T3FREE, THYROIDAB in the last 72 hours. Anemia Panel: No results for input(s): VITAMINB12, FOLATE, FERRITIN, TIBC, IRON, RETICCTPCT in the last 72 hours.   @LABRCNTIP (procalcitonin:4,lacticidven:4) )No results found for this or any previous visit (from the past 240 hour(s)).    UA  not ordered  No results found for: HGBA1C  Estimated Creatinine Clearance: 55.4 mL/min (by C-G formula based on SCr of 0.72 mg/dL).  BNP (last 3 results) No results for input(s): PROBNP in the last 8760 hours.   ECG REPORT Not obtained  Filed Weights   01/24/16 1340 01/24/16 1529  Weight: 49.4 kg (109 lb) 49.4 kg (109 lb)     Cultures: No results found for: SDES, SPECREQUEST, CULT, REPTSTATUS   Radiological Exams on Admission: Dg Chest 2 View  Result Date: 01/24/2016 CLINICAL DATA:  Recurring hemoptysis. History of hypertension and breast cancer. EXAM: CHEST  2 VIEW COMPARISON:  01/12/2016 chest radiograph, chest CT 11/15/2014 FINDINGS: Cardiomediastinal silhouette is normal. Mediastinal contours appear intact. Put mild tortuosity of the aorta. There is no evidence of focal airspace consolidation, pleural effusion or pneumothorax. Persistent mild upper lobe predominant bronchiectasis and pleural parenchymal scarring of the apices is seen. The previously demonstrated by CT soft tissue nodules are not well seen radiographically. Few mm  calcified nodules in the left lower lobe likely represent granulomas. There is a hyperdense asymmetry overlying the right lung apex with uncertain significance. Osseous structures are without acute abnormality. Soft tissues are grossly normal. IMPRESSION: Hyperdense asymmetry overlying  the right lung apex. This finding is in an area where several bony structures overlap, and therefore its significance is uncertain. The previously demonstrated by CT soft tissue nodules in the left upper lobe are not well seen radiographically. Noncontrasted chest CT will provide more adequate follow-up of these findings. Electronically Signed   By: Fidela Salisbury M.D.   On: 01/24/2016 16:21    Chart has been reviewed    Assessment/Plan   65 y.o. female with medical history significant of bronchiectasis, HTN, trigeminal Neuralgia being admitted for significant hemoptysis,pulmonology aware we'll see in consult  Present on Admission: . Hemoptysis  - admit to step given significant respiratory distress and patient having episodes. Monitor on continuous pulse ox. Appreciate pulmonology consult. Keep nothing by mouth post midnight in case she'll need a bronchoscopy. Avoid Lovenox. Ordered CT of the chest to evaluate further . Essential hypertension - continue home medications currently stable Bronchiectasis- since in the past patient has improved with doxycycline will resume as discussed with pulmonology.   Other plan as per orders.  DVT prophylaxis:  SCD     Code Status:  FULL CODE  as per patient    Family Communication:   Family not at  Bedside    Disposition Plan:      To home once workup is complete and patient is stable                              Consults called: Pulmonology   Admission status:   inpatient     Level of care       SDU      I have spent a total of 56 min on this admission    Nathalee Smarr 01/24/2016, 8:57 PM    [facility]  Pager [phone]   after 2 AM  please page floor coverage PA If 7AM-7PM, please contact the day team taking care of the patient  [email or web address]  Password [email or web address]

## 2016-01-24 NOTE — ED Provider Notes (Signed)
[facility] DEPT Provider Note   CSN: [id number] Arrival date & time: 01/24/16  1335     History   Chief Complaint Chief Complaint  Patient presents with  . Hemoptysis    HPI Lindsay Schultz is a 65 y.o. female.  Patient with a history of Bronchiectasis followed by Pulmonology presents today with complaints of hemoptysis, cough, and pleuritic chest pain.  She reports that her symptoms have been constant since 4:30 AM this morning.  She states that she has filled half of a three ounce cup with blood.  She was seen by Pulmonology on 01/12/16 for hemoptysis and was put on a 5 day course of Doxycycline, which she reports she completed one week ago.  Chart reviewed and Pulmonology note states that they recommend CT chest and bronchoscopy if the hemoptysis returns.  She denies fever or chills.  She reports that she does not have any SOB out of the ordinary.  Denies nausea, vomiting, dizziness, syncope, or any other symptoms.  No history of PE or DVT.  No prolonged travel or surgeries in the past month.  Denies LE edema or erythema.  She is not on any anticoagulants.      Past Medical History:  Diagnosis Date  . Allergic rhinitis   . Benign paroxysmal positional vertigo 06/14/2015  . Bronchiectasis ([place])   . GERD (gastroesophageal reflux disease)   . HTN (hypertension)   . Hx of breast cancer   . Osteopenia     Patient Active Problem List   Diagnosis Date Noted  . Bronchiectasis without acute exacerbation ([place]) 01/12/2016  . Well adult exam 11/07/2015  . Benign paroxysmal positional vertigo 06/14/2015  . Rash and nonspecific skin eruption 10/30/2014  . Pain in both feet 09/26/2012  . Bilateral hand pain 09/26/2012  . B12 deficiency 08/23/2012  . Paresthesia and pain of extremity 08/22/2012  . LBP radiating to right leg 03/27/2012  . Herpes zoster infection 03/27/2012  . Need for prophylactic vaccination and inoculation against other viral diseases(V04.89) 02/15/2012  .  Trigeminal neuralgia pain 01/19/2012  . Asthmatic bronchitis 07/27/2011  . Sinusitis, acute 02/19/2011  . Skin rash 02/19/2011  . NEOPLASM OF UNCERTAIN BEHAVIOR OF SKIN 12/12/2009  . CERUMEN IMPACTION 12/12/2009  . SHOULDER PAIN 01/29/2009  . Headache(784.0) 12/11/2008  . BRONCHITIS, ACUTE 11/06/2008  . Essential hypertension 06/12/2007  . Allergic rhinitis 06/12/2007  . GERD 06/12/2007  . Disorder of bone and cartilage 06/12/2007  . BREAST CANCER, HX OF 06/12/2007    Past Surgical History:  Procedure Laterality Date  . ABDOMINAL HYSTERECTOMY    . APPENDECTOMY    . BREAST LUMPECTOMY  2008   Left  . NASAL SINUS SURGERY  1997    OB History    No data available       Home Medications    Prior to Admission medications   Medication Sig Start Date End Date Taking? Authorizing Provider  butalbital-acetaminophen-caffeine (FIORICET, ESGIC) [phone] MG tablet Take 1 tablet by mouth every 6 (six) hours as needed for headache. 11/07/15   Cassandria Anger, MD  calcitonin, salmon, (MIACALCIN/FORTICAL) 200 UNIT/ACT nasal spray Place 1 spray into alternate nostrils daily. 01/22/16   Cassandria Anger, MD  Cholecalciferol 2000 UNITS CAPS Take by mouth daily.    Historical Provider, MD  CRANBERRY FRUIT PO Take 1 each by mouth daily.    Historical Provider, MD  Cyanocobalamin (VITAMIN B-12) 500 MCG SUBL 1 tab sl qd Patient taking differently: Take 500 tablets by mouth 2 (two)  times daily.  08/23/12   Evie Lacks Plotnikov, MD  fluticasone (FLONASE) 50 MCG/ACT nasal spray Place 1 spray into both nostrils daily as needed. 11/07/15   Evie Lacks Plotnikov, MD  Garlic [phone] MG CAPS Take 1 capsule by mouth daily.    Historical Provider, MD  Multiple Vitamins-Minerals (MULTIVITAMIN WITH MINERALS) tablet Take 1 tablet by mouth daily.    Historical Provider, MD  nadolol (CORGARD) 40 MG tablet Take 0.5 tablets (20 mg total) by mouth 2 (two) times daily. 11/07/15   Evie Lacks Plotnikov, MD  Pyridoxine HCl  (VITAMIN B-6 PO) Take 100 mcg by mouth 2 (two) times daily.     Historical Provider, MD  Respiratory Therapy Supplies (FLUTTER) DEVI Use as directed. Patient not taking: Reported on 01/12/2016 02/18/15   Marshell Garfinkel, MD  TURMERIC PO Take by mouth 2 (two) times daily.    Historical Provider, MD    Family History Family History  Problem Relation Age of Onset  . Sjogren's syndrome Mother   . Arthritis Father 51    RA  . Emphysema Father   . Heart disease Father   . Hypertension Other   . Hodgkin's lymphoma Brother     Social History Social History  Substance Use Topics  . Smoking status: Never Smoker  . Smokeless tobacco: Never Used  . Alcohol use No     Allergies   Ciprofloxacin; Clarithromycin; Meloxicam; Paroxetine; Penicillins; and Zithromax [azithromycin]   Review of Systems Review of Systems  All other systems reviewed and are negative.    Physical Exam Updated Vital Signs BP 154/64 (BP Location: Right Arm)   Pulse (!) 58   Temp 98 F (36.7 C) (Oral)   Resp 17   Ht 5\' 3"  (1.6 m)   Wt 49.4 kg   SpO2 100%   BMI 19.31 kg/m   Physical Exam  Constitutional: She appears well-developed and well-nourished.  HENT:  Head: Normocephalic and atraumatic.  Mouth/Throat: Oropharynx is clear and moist.  Neck: Normal range of motion. Neck supple.  Cardiovascular: Normal rate, regular rhythm and normal heart sounds.   Pulmonary/Chest: Effort normal and breath sounds normal. No respiratory distress. She has no wheezes. She has no rales.  Musculoskeletal: Normal range of motion.  Neurological: She is alert.  Skin: Skin is warm and dry.  Psychiatric: She has a normal mood and affect.  Nursing note and vitals reviewed.    ED Treatments / Results  Labs (all labs ordered are listed, but only abnormal results are displayed) Labs Reviewed  CBC WITH DIFFERENTIAL/PLATELET  COMPREHENSIVE METABOLIC PANEL  D-DIMER, QUANTITATIVE (NOT AT [facility])    EKG  EKG  Interpretation None       Radiology Dg Chest 2 View  Result Date: 01/24/2016 CLINICAL DATA:  Recurring hemoptysis. History of hypertension and breast cancer. EXAM: CHEST  2 VIEW COMPARISON:  01/12/2016 chest radiograph, chest CT 11/15/2014 FINDINGS: Cardiomediastinal silhouette is normal. Mediastinal contours appear intact. Put mild tortuosity of the aorta. There is no evidence of focal airspace consolidation, pleural effusion or pneumothorax. Persistent mild upper lobe predominant bronchiectasis and pleural parenchymal scarring of the apices is seen. The previously demonstrated by CT soft tissue nodules are not well seen radiographically. Few mm calcified nodules in the left lower lobe likely represent granulomas. There is a hyperdense asymmetry overlying the right lung apex with uncertain significance. Osseous structures are without acute abnormality. Soft tissues are grossly normal. IMPRESSION: Hyperdense asymmetry overlying the right lung apex. This finding is in an area  where several bony structures overlap, and therefore its significance is uncertain. The previously demonstrated by CT soft tissue nodules in the left upper lobe are not well seen radiographically. Noncontrasted chest CT will provide more adequate follow-up of these findings. Electronically Signed   By: Fidela Salisbury M.D.   On: 01/24/2016 16:21    Procedures Procedures (including critical care time)  Medications Ordered in ED Medications - No data to display   Initial Impression / Assessment and Plan / ED Course  I have reviewed the triage vital signs and the nursing notes.  Pertinent labs & imaging results that were available during my care of the patient were reviewed by me and considered in my medical decision making (see chart for details).  Clinical Course   6:06 PM Discussed with Dr Oletta Darter with Critical Care.  He states that Pulmonology will see the patient in consult, but recommends admission to  hospitalist.  Final Clinical Impressions(s) / ED Diagnoses   Final diagnoses:  Hemoptysis   Patient with a history of Bronchiectasis followed by Pulmunology presents today with a chief complaint of hemoptysis onset earlier this morning.  Chart reviewed and she was seen by Pulmonology on 01/12/16 and they recommended CT chest and bronchoscopy if hemoptysis returned.  Discussed case today with Dr Oletta Darter with Critical Care/Pulmonology.  He recommends admission to Hospitalist and he will be available for consult.  Discussed with Dr Roel Cluck who has agreed to admit the patient.  New Prescriptions New Prescriptions   No medications on file     Hyman Bible, PA-C 01/25/16 [place], MD 01/25/16 [phone]

## 2016-01-24 NOTE — ED Notes (Signed)
Patient transported to X-ray 

## 2016-01-24 NOTE — ED Triage Notes (Signed)
She states she sees Dr. Doristine Mango al for bronchiectasis. She states she saw him just over a week ago for hemoptysis; and he prescribed Z pack. She finished Z pack and it "got better"; then today the hemoptysis recurred. She tells me she is in no pain and has had no fever and feels "alright".  Her skin is normal, warm and dry and she is breathing normally.

## 2016-01-24 NOTE — Telephone Encounter (Signed)
Patient called and states she had been coughing up blood for 1. 5 weeks and saw pulmonary and was given an antibiotic to take for 5 days.  Patient completed the antibiotic on Saturday and has started coughing up bright red blood again. Patient states the bright red blood is about a tablespoon full and it is more than it was before.  Pt has complaints of SOB, chest irritation and hurting in her chest; Advised pt that per Dr. [place] she should seek medical attention immediately.  Pt states she wants to come to a [facility] so that her physicians can follow her care. Pt verbalized understanding and will go to [facility] or [facility].

## 2016-01-25 DIAGNOSIS — J471 Bronchiectasis with (acute) exacerbation: Principal | ICD-10-CM

## 2016-01-25 DIAGNOSIS — R042 Hemoptysis: Secondary | ICD-10-CM

## 2016-01-25 DIAGNOSIS — K219 Gastro-esophageal reflux disease without esophagitis: Secondary | ICD-10-CM

## 2016-01-25 LAB — TSH: TSH: 2.394 u[IU]/mL (ref 0.350–4.500)

## 2016-01-25 LAB — COMPREHENSIVE METABOLIC PANEL
ALK PHOS: 76 U/L (ref 38–126)
ALT: 14 U/L (ref 14–54)
ANION GAP: 7 (ref 5–15)
AST: 22 U/L (ref 15–41)
Albumin: 3.7 g/dL (ref 3.5–5.0)
BILIRUBIN TOTAL: 0.8 mg/dL (ref 0.3–1.2)
BUN: 12 mg/dL (ref 6–20)
CALCIUM: 9 mg/dL (ref 8.9–10.3)
CO2: 28 mmol/L (ref 22–32)
Chloride: 106 mmol/L (ref 101–111)
Creatinine, Ser: 0.68 mg/dL (ref 0.44–1.00)
Glucose, Bld: 83 mg/dL (ref 65–99)
Potassium: 3.6 mmol/L (ref 3.5–5.1)
Sodium: 141 mmol/L (ref 135–145)
TOTAL PROTEIN: 6.2 g/dL — AB (ref 6.5–8.1)

## 2016-01-25 LAB — CBC
HCT: 36 % (ref 36.0–46.0)
HEMOGLOBIN: 12 g/dL (ref 12.0–15.0)
MCH: 30.8 pg (ref 26.0–34.0)
MCHC: 33.3 g/dL (ref 30.0–36.0)
MCV: 92.3 fL (ref 78.0–100.0)
Platelets: 158 10*3/uL (ref 150–400)
RBC: 3.9 MIL/uL (ref 3.87–5.11)
RDW: 12.9 % (ref 11.5–15.5)
WBC: 6.3 10*3/uL (ref 4.0–10.5)

## 2016-01-25 LAB — PHOSPHORUS: Phosphorus: 3.5 mg/dL (ref 2.5–4.6)

## 2016-01-25 LAB — MAGNESIUM: MAGNESIUM: 2 mg/dL (ref 1.7–2.4)

## 2016-01-25 MED ORDER — IPRATROPIUM-ALBUTEROL 0.5-2.5 (3) MG/3ML IN SOLN: 3.0000 mL | RESPIRATORY_TRACT | Status: DC | PRN

## 2016-01-25 MED ORDER — METHYLPREDNISOLONE SODIUM SUCC 125 MG IJ SOLR
60.0000 mg | Freq: Three times a day (TID) | INTRAMUSCULAR | Status: DC
  Administered 2016-01-25 – 2016-01-26 (×4): 60 mg via INTRAVENOUS
  Filled 2016-01-25 (×4): qty 2

## 2016-01-25 MED ORDER — IPRATROPIUM-ALBUTEROL 0.5-2.5 (3) MG/3ML IN SOLN
3.0000 mL | Freq: Four times a day (QID) | RESPIRATORY_TRACT | Status: DC
  Administered 2016-01-25 – 2016-01-26 (×3): 3 mL via RESPIRATORY_TRACT
  Filled 2016-01-25 (×3): qty 3

## 2016-01-25 NOTE — Progress Notes (Signed)
PROGRESS NOTE    Lindsay Schultz  [id number] DOB: 04/01/1951 DOA: 01/24/2016 PCP: Walker Kehr, MD    Brief Narrative:  Patient is a pleasant 65 year old female prior history of breast cancer. Also history of bronchiectasis and hemoptysis recently treated with a 5 day course of doxycycline with some clinical improvement. Patient however started experiencing some hemoptysis on the morning of admission and subsequently presented to the ED. Patient was admitted placing the step down unit for closer monitoring for hemoptysis and pulmonary consulted. Patient for probable bronchoscopy 01/26/2016.   Assessment & Plan:   Principal Problem:   Bronchiectasis with (acute) exacerbation (HCC) Active Problems:   Hemoptysis   Essential hypertension   GERD   Trigeminal neuralgia pain  #1 bronchiectasis with an acute exacerbation with hemoptysis Questionable etiology. Per pulmonary source of hemoptysis likely left upper lobe per CT chest findings. Hemoptysis improving. Patient denies any shortness of breath. Continue empiric antibiotics of doxycycline and Fortaz, Mucinex. Patient has been placed on IV steroids and duo nebs per pulmonary. Patient to be nothing by mouth after 7 PM for possible bronchoscopy 01/26/2016 per pulmonary. A massive hemoptysis we'll need to consult with IR for possible embolization. Pulmonary/CCM following and appreciate input and recommendations.  #2 hypertension Stable. Continue home regimen and overall.  #3 gastroesophageal reflux disease Pepcid.  #4 trigeminal neuralgia Stable.   DVT prophylaxis: SCDs Code Status: Full Family Communication: Updated patient. No family at bedside. Disposition Plan: Home once hemoptysis has resolved and medically stable and per pulmonary.   Consultants:   Pulmonary: Dr. Ancil Linsey 01/24/2016  Procedures:   CT angiogram chest 01/24/2016  Chest x-ray 01/24/2016  Antimicrobials:   IV Tressie Ellis 01/24/2016  Oral doxycycline  01/24/2016   Subjective: Patient sitting up in bed. States she's hungry. Patient had an episode of hemoptysis last night around 11 PM and none since then. No shortness of breath. No chest pain. Patient states she's feeling better.  Objective: Vitals:   01/25/16 0500 01/25/16 0600 01/25/16 0700 01/25/16 0800  BP: (!) 92/46 (!) 94/55 118/60   Pulse:      Resp: 14 15 13    Temp:    98.1 F (36.7 C)  TempSrc:    Oral  SpO2: 93% 95% 95%   Weight:      Height:        Intake/Output Summary (Last 24 hours) at 01/25/16 0957 Last data filed at 01/25/16 0600  Gross per 24 hour  Intake              625 ml  Output              250 ml  Net              375 ml   Filed Weights   01/24/16 1340 01/24/16 1529 01/24/16 2120  Weight: 49.4 kg (109 lb) 49.4 kg (109 lb) 49 kg (108 lb 0.4 oz)    Examination:  General exam: Appears calm and comfortable  Respiratory system: Clear to auscultation. Respiratory effort normal. Cardiovascular system: S1 & S2 heard, RRR. No JVD, murmurs, rubs, gallops or clicks. No pedal edema. Gastrointestinal system: Abdomen is nondistended, soft and nontender. No organomegaly or masses felt. Normal bowel sounds heard. Central nervous system: Alert and oriented. No focal neurological deficits. Extremities: Symmetric 5 x 5 power. Skin: No rashes, lesions or ulcers Psychiatry: Judgement and insight appear normal. Mood & affect appropriate.     Data Reviewed: I have personally reviewed following labs and imaging studies  CBC:  Recent Labs Lab 01/24/16 1618 01/25/16 0604  WBC 8.0 6.3  NEUTROABS 5.2  --   HGB 13.4 12.0  HCT 40.6 36.0  MCV 92.1 92.3  PLT 200 0000000   Basic Metabolic Panel:  Recent Labs Lab 01/24/16 1618 01/25/16 0604  NA 140 141  K 3.7 3.6  CL 103 106  CO2 29 28  GLUCOSE 90 83  BUN 13 12  CREATININE 0.72 0.68  CALCIUM 9.8 9.0  MG  --  2.0  PHOS  --  3.5   GFR: Estimated Creatinine Clearance: 55 mL/min (by C-G formula based on SCr  of 0.68 mg/dL). Liver Function Tests:  Recent Labs Lab 01/24/16 1618 01/25/16 0604  AST 27 22  ALT 17 14  ALKPHOS 95 76  BILITOT 0.9 0.8  PROT 7.9 6.2*  ALBUMIN 4.7 3.7   No results for input(s): LIPASE, AMYLASE in the last 168 hours. No results for input(s): AMMONIA in the last 168 hours. Coagulation Profile: No results for input(s): INR, PROTIME in the last 168 hours. Cardiac Enzymes: No results for input(s): CKTOTAL, CKMB, CKMBINDEX, TROPONINI in the last 168 hours. BNP (last 3 results) No results for input(s): PROBNP in the last 8760 hours. HbA1C: No results for input(s): HGBA1C in the last 72 hours. CBG: No results for input(s): GLUCAP in the last 168 hours. Lipid Profile: No results for input(s): CHOL, HDL, LDLCALC, TRIG, CHOLHDL, LDLDIRECT in the last 72 hours. Thyroid Function Tests:  Recent Labs  01/25/16 0604  TSH 2.394   Anemia Panel: No results for input(s): VITAMINB12, FOLATE, FERRITIN, TIBC, IRON, RETICCTPCT in the last 72 hours. Sepsis Labs: No results for input(s): PROCALCITON, LATICACIDVEN in the last 168 hours.  Recent Results (from the past 240 hour(s))  MRSA PCR Screening     Status: None   Collection Time: 01/24/16  9:12 PM  Result Value Ref Range Status   MRSA by PCR NEGATIVE NEGATIVE Final    Comment:        The GeneXpert MRSA Assay (FDA approved for NASAL specimens only), is one component of a comprehensive MRSA colonization surveillance program. It is not intended to diagnose MRSA infection nor to guide or monitor treatment for MRSA infections.   Culture, expectorated sputum-assessment     Status: None   Collection Time: 01/24/16 10:42 PM  Result Value Ref Range Status   Specimen Description SPUTUM  Final   Special Requests Normal  Final   Sputum evaluation   Final    THIS SPECIMEN IS ACCEPTABLE. RESPIRATORY CULTURE REPORT TO FOLLOW.   Report Status 01/24/2016 FINAL  Final  Culture, respiratory (NON-Expectorated)     Status:  None (Preliminary result)   Collection Time: 01/24/16 10:42 PM  Result Value Ref Range Status   Specimen Description SPU  Final   Special Requests NONE  Final   Gram Stain   Final    RARE SQUAMOUS EPITHELIAL CELLS PRESENT FEW WBC PRESENT,BOTH PMN AND MONONUCLEAR FEW GRAM POSITIVE COCCI IN PAIRS FEW GRAM NEGATIVE COCCI IN PAIRS FEW GRAM NEGATIVE RODS Performed at [facility]    Culture PENDING  Incomplete   Report Status PENDING  Incomplete         Radiology Studies: Dg Chest 2 View  Result Date: 01/24/2016 CLINICAL DATA:  Recurring hemoptysis. History of hypertension and breast cancer. EXAM: CHEST  2 VIEW COMPARISON:  01/12/2016 chest radiograph, chest CT 11/15/2014 FINDINGS: Cardiomediastinal silhouette is normal. Mediastinal contours appear intact. Put mild tortuosity of the aorta. There  is no evidence of focal airspace consolidation, pleural effusion or pneumothorax. Persistent mild upper lobe predominant bronchiectasis and pleural parenchymal scarring of the apices is seen. The previously demonstrated by CT soft tissue nodules are not well seen radiographically. Few mm calcified nodules in the left lower lobe likely represent granulomas. There is a hyperdense asymmetry overlying the right lung apex with uncertain significance. Osseous structures are without acute abnormality. Soft tissues are grossly normal. IMPRESSION: Hyperdense asymmetry overlying the right lung apex. This finding is in an area where several bony structures overlap, and therefore its significance is uncertain. The previously demonstrated by CT soft tissue nodules in the left upper lobe are not well seen radiographically. Noncontrasted chest CT will provide more adequate follow-up of these findings. Electronically Signed   By: Fidela Salisbury M.D.   On: 01/24/2016 16:21   Ct Angio Chest Pe W And/or Wo Contrast  Result Date: 01/24/2016 CLINICAL DATA:  Bronchiectasis. Evaluate for pulmonary embolus.  Hemoptysis. History of breast cancer. EXAM: CT ANGIOGRAPHY CHEST WITH CONTRAST TECHNIQUE: Multidetector CT imaging of the chest was performed using the standard protocol during bolus administration of intravenous contrast. Multiplanar CT image reconstructions and MIPs were obtained to evaluate the vascular anatomy. CONTRAST:  100 cc Isovue 370 intravenously. COMPARISON:  Chest radiograph from the same day, chest CT 11/15/2014. FINDINGS: Cardiovascular: Satisfactory opacification of the pulmonary arteries to the segmental level. No evidence of pulmonary embolism. Normal heart size. No pericardial effusion. Mild calcific coronary artery disease. Post left thyroidectomy. Mild enlargement of the esophagus. Mediastinum/Nodes: No enlarged mediastinal, hilar, or axillary lymph nodes. Calcified left hilar lymph nodes are stable. Right thyroid gland, and trachea demonstrate no significant findings. Lungs/Pleura: The previously described 3 discrete soft tissue nodules in the left upper lobe measuring less than 7 mm are stable. There are multiple areas of bronchiectasis with soft tissue filling the dilated distal bronchi, stable. There has been however interval development of more acute appearing predominantly peribronchial ground-glass opacities throughout the left lung. Prominent bronchiectasis and atelectatic changes are seen in the left upper lobe. 17 mm soft tissue peribronchial thickening is seen adjacent to un area of cylindrical bronchiectasis, image 47/91, sequence 7. Upper Abdomen: No acute abnormality. Musculoskeletal: No chest wall abnormality. No acute or significant osseous findings. Review of the MIP images confirms the above findings. IMPRESSION: No evidence of pulmonary embolus. Mild calcific coronary artery disease. Stable appearance of 3 discrete soft tissue nodules in the left upper lobe, stable from July 2016, and therefore likely benign in etiology. Multiple areas of chronic bronchiectasis with soft  tissue filling dilated bronchi, likely representing mucous impaction. Endobronchial lesions although possible are favored less likely. New area of bronchiectasis with soft tissue peribronchial thickening in the left upper lobe, with adjacent ground-glass airspace consolidation. Other scattered areas of tree-in-bud opacities throughout the left lung. These findings likely represent acute infectious/ inflammatory changes such as bronchiolitis. Interstitial spread of metastatic disease, especially in the left upper lobe, can however have similar appearance. Electronically Signed   By: Fidela Salisbury M.D.   On: 01/24/2016 21:40        Scheduled Meds: . cefTAZidime (FORTAZ)  IV  1 g Intravenous Q8H  . doxycycline  100 mg Oral Q12H  . guaiFENesin  600 mg Oral BID  . ipratropium-albuterol  3 mL Nebulization Q6H  . methylPREDNISolone (SOLU-MEDROL) injection  60 mg Intravenous Q8H  . nadolol  20 mg Oral BID  . sodium chloride flush  3 mL Intravenous Q12H   Continuous  Infusions:    LOS: 1 day    Time spent: 45 minutes    THOMPSON,DANIEL, MD [facility] Pager [phone]   If 7PM-7AM, please contact night-coverage [email or web address] Password [email or web address] 01/25/2016, 9:57 AM

## 2016-01-25 NOTE — Progress Notes (Addendum)
Patient name: Lindsay Schultz Medical record number: [id number] Date of birth: 19-Jan-1951 Age: 65 y.o. Gender: female PCP: Walker Kehr, MD  Date: 01/25/2016 Reason for Consult: Hemoptysis Referring Physician: Eugenie Filler, MD  briefI:  [name] is a 65 y/o woman with bronchiectasis who was recently experienced some hemoptysis and was treated with 5 days of doxycycline. She did not have other symptoms of a bronchiectasis exacerbation. She again experienced hemoptysis this morning, coughing up about 75 mL over the course of the day. She came to the ED and pulmonary was consulted.   EVENTS 01/24/2016 - admit    SUBJECTIVE/OVERNIGHT/INTERVAL HX 01/25/16 - reports 1/2 cup hemoptysis at home. 1/2 cup in ER yesterday . Since admit few tablesppos last night. Otherwise well. On abx and neb Rx currently. Wants to eat. CT chest visualized shows new LUL infiltrate compared to prior   Temp:  [97.4 F (36.3 C)-98.2 F (36.8 C)] 98.1 F (36.7 C) (10/08 0800) Pulse Rate:  [58-67] 67 (10/07 2059) Resp:  [13-20] 13 (10/08 0700) BP: (92-201)/(36-84) 118/60 (10/08 0700) SpO2:  [92 %-100 %] 95 % (10/08 0700) Weight:  [49 kg (108 lb 0.4 oz)-49.4 kg (109 lb)] 49 kg (108 lb 0.4 oz) (10/07 2120)  Intake/Output Summary (Last 24 hours) at 01/25/16 0927 Last data filed at 01/25/16 0600  Gross per 24 hour  Intake              625 ml  Output              250 ml  Net              375 ml   Physical exam Vitals:   01/25/16 0500 01/25/16 0600 01/25/16 0700 01/25/16 0800  BP: (!) 92/46 (!) 94/55 118/60   Pulse:      Resp: 14 15 13    Temp:    98.1 F (36.7 C)  TempSrc:    Oral  SpO2: 93% 95% 95%   Weight:      Height:        Vitals:   01/25/16 0700 01/25/16 0800  BP: 118/60   Pulse:    Resp: 13   Temp:  98.1 F (36.7 C)  Gen: Eldery woman, NAD Pulm:CTA [place] Card: Normal S1/S2 Abd: Soft Skin: No rashes Extrem: No clubbing   LAB RESULTS PULMONARY No results for input(s): PHART,  PCO2ART, PO2ART, HCO3, TCO2, O2SAT in the last 168 hours.  Invalid input(s): PCO2, PO2  CBC  Recent Labs Lab 01/24/16 1618 01/25/16 0604  HGB 13.4 12.0  HCT 40.6 36.0  WBC 8.0 6.3  PLT 200 158    COAGULATION No results for input(s): INR in the last 168 hours.  CARDIAC  No results for input(s): TROPONINI in the last 168 hours. No results for input(s): PROBNP in the last 168 hours.   CHEMISTRY  Recent Labs Lab 01/24/16 1618 01/25/16 0604  NA 140 141  K 3.7 3.6  CL 103 106  CO2 29 28  GLUCOSE 90 83  BUN 13 12  CREATININE 0.72 0.68  CALCIUM 9.8 9.0  MG  --  2.0  PHOS  --  3.5   Estimated Creatinine Clearance: 55 mL/min (by C-G formula based on SCr of 0.68 mg/dL).   LIVER  Recent Labs Lab 01/24/16 1618 01/25/16 0604  AST 27 22  ALT 17 14  ALKPHOS 95 76  BILITOT 0.9 0.8  PROT 7.9 6.2*  ALBUMIN 4.7 3.7     INFECTIOUS No results  for input(s): LATICACIDVEN, PROCALCITON in the last 168 hours.   ENDOCRINE CBG (last 3)  No results for input(s): GLUCAP in the last 72 hours.       IMAGING x48h  - image(s) personally visualized  -   highlighted in bold Dg Chest 2 View  Result Date: 01/24/2016 CLINICAL DATA:  Recurring hemoptysis. History of hypertension and breast cancer. EXAM: CHEST  2 VIEW COMPARISON:  01/12/2016 chest radiograph, chest CT 11/15/2014 FINDINGS: Cardiomediastinal silhouette is normal. Mediastinal contours appear intact. Put mild tortuosity of the aorta. There is no evidence of focal airspace consolidation, pleural effusion or pneumothorax. Persistent mild upper lobe predominant bronchiectasis and pleural parenchymal scarring of the apices is seen. The previously demonstrated by CT soft tissue nodules are not well seen radiographically. Few mm calcified nodules in the left lower lobe likely represent granulomas. There is a hyperdense asymmetry overlying the right lung apex with uncertain significance. Osseous structures are without acute  abnormality. Soft tissues are grossly normal. IMPRESSION: Hyperdense asymmetry overlying the right lung apex. This finding is in an area where several bony structures overlap, and therefore its significance is uncertain. The previously demonstrated by CT soft tissue nodules in the left upper lobe are not well seen radiographically. Noncontrasted chest CT will provide more adequate follow-up of these findings. Electronically Signed   By: Fidela Salisbury M.D.   On: 01/24/2016 16:21   Ct Angio Chest Pe W And/or Wo Contrast  Result Date: 01/24/2016 CLINICAL DATA:  Bronchiectasis. Evaluate for pulmonary embolus. Hemoptysis. History of breast cancer. EXAM: CT ANGIOGRAPHY CHEST WITH CONTRAST TECHNIQUE: Multidetector CT imaging of the chest was performed using the standard protocol during bolus administration of intravenous contrast. Multiplanar CT image reconstructions and MIPs were obtained to evaluate the vascular anatomy. CONTRAST:  100 cc Isovue 370 intravenously. COMPARISON:  Chest radiograph from the same day, chest CT 11/15/2014. FINDINGS: Cardiovascular: Satisfactory opacification of the pulmonary arteries to the segmental level. No evidence of pulmonary embolism. Normal heart size. No pericardial effusion. Mild calcific coronary artery disease. Post left thyroidectomy. Mild enlargement of the esophagus. Mediastinum/Nodes: No enlarged mediastinal, hilar, or axillary lymph nodes. Calcified left hilar lymph nodes are stable. Right thyroid gland, and trachea demonstrate no significant findings. Lungs/Pleura: The previously described 3 discrete soft tissue nodules in the left upper lobe measuring less than 7 mm are stable. There are multiple areas of bronchiectasis with soft tissue filling the dilated distal bronchi, stable. There has been however interval development of more acute appearing predominantly peribronchial ground-glass opacities throughout the left lung. Prominent bronchiectasis and atelectatic  changes are seen in the left upper lobe. 17 mm soft tissue peribronchial thickening is seen adjacent to un area of cylindrical bronchiectasis, image 47/91, sequence 7. Upper Abdomen: No acute abnormality. Musculoskeletal: No chest wall abnormality. No acute or significant osseous findings. Review of the MIP images confirms the above findings. IMPRESSION: No evidence of pulmonary embolus. Mild calcific coronary artery disease. Stable appearance of 3 discrete soft tissue nodules in the left upper lobe, stable from July 2016, and therefore likely benign in etiology. Multiple areas of chronic bronchiectasis with soft tissue filling dilated bronchi, likely representing mucous impaction. Endobronchial lesions although possible are favored less likely. New area of bronchiectasis with soft tissue peribronchial thickening in the left upper lobe, with adjacent ground-glass airspace consolidation. Other scattered areas of tree-in-bud opacities throughout the left lung. These findings likely represent acute infectious/ inflammatory changes such as bronchiolitis. Interstitial spread of metastatic disease, especially in the left upper  lobe, can however have similar appearance. Electronically Signed   By: Fidela Salisbury M.D.   On: 01/24/2016 21:40      ICD-9-CM ICD-10-CM   1. Hemoptysis 786.30 R04.2 DG Chest 2 View     DG Chest 2 View     Bronchiectasis (PFT oct 2016 with mild restriction high 70s) with exacerbation with hemoptysis:  - improved  Hemoptysis with antibiotic Rx - source of hemoptysis likely LUL based on CT  PLAN  - ok to eat for day with careful monitoring but convert to NPO if hemoptysis recurs - NPO after 7pm for possible bronch 01/26/16 based on clinical profile  - add IV steroids - start Duoneb q6h - if massive hemoptysis then call IR - continue antibiotics as below  Anti-infectives    Start     Dose/Rate Route Frequency Ordered Stop   01/24/16 2200  doxycycline (VIBRA-TABS) tablet 100  mg     100 mg Oral Every 12 hours 01/24/16 2111     01/24/16 2200  cefTAZidime (FORTAZ) 1 g in dextrose 5 % 50 mL IVPB     1 g 100 mL/hr over 30 Minutes Intravenous Every 8 hours 01/24/16 2127          Patient updated in detail of above plan and she verbalized understanding    Dr. Brand Males, M.D., [facility].C.P Pulmonary and Critical Care Medicine Staff Physician [place] [facility] Pager: [phone], If no answer or between  15:00h - 7:00h: call [phone]  01/25/2016 9:28 AM

## 2016-01-25 NOTE — Consult Note (Signed)
Patient name: Lindsay Schultz Medical record number: [id number] Date of birth: [birth year] Age: 65 y.o. Gender: female PCP: Walker Kehr, MD  Date: 01/25/2016 Reason for Consult: Hemoptysis Referring Physician: Toy Baker, MD  HPI:  [name] is a 65 y/o woman with bronchiectasis who was recently experienced some hemoptysis and was treated with 5 days of doxycycline. She did not have other symptoms of a bronchiectasis exacerbation. She again experienced hemoptysis this morning, coughing up about 75 mL over the course of the day. She came to the ED and pulmonary was consulted.   Past Medical History:  Diagnosis Date  . Allergic rhinitis   . Benign paroxysmal positional vertigo 06/14/2015  . Bronchiectasis ([employer])   . GERD (gastroesophageal reflux disease)   . HTN (hypertension)   . Hx of breast cancer   . Osteopenia     Past Surgical History:  Procedure Laterality Date  . ABDOMINAL HYSTERECTOMY    . APPENDECTOMY    . BREAST LUMPECTOMY  2008   Left  . NASAL SINUS SURGERY  1997    Family History  Problem Relation Age of Onset  . Sjogren's syndrome Mother   . Arthritis Father 73    RA  . Emphysema Father   . Heart disease Father   . Hypertension Other   . Hodgkin's lymphoma Brother     Social History:  reports that she has never smoked. She has never used smokeless tobacco. She reports that she does not drink alcohol or use drugs.  Allergies:  Allergies  Allergen Reactions  . Meloxicam Nausea And Vomiting    Upset stomach  . Paroxetine Other (See Comments)    Passed out  . Penicillins Other (See Comments)    passed out after a shot at 65 y.o  Has patient had a PCN reaction causing immediate rash, facial/tongue/throat swelling, SOB or lightheadedness with hypotension: no Has patient had a PCN reaction causing severe rash involving mucus membranes or skin necrosis:  no Has patient had a PCN reaction that required hospitalization: no Has patient had a PCN reaction  occurring within the last 10 years: no If all of the above answers are "NO", then may proceed with Cephalosporin use.   . Ciprofloxacin Rash     rash along the vein w/IV Cipro  . Clarithromycin Rash  . Zithromax [Azithromycin] Rash    Medications: I have reviewed the patient's current medications.  Pertinent items are noted in HPI.  Temp:  [97.8 F (36.6 C)-98.2 F (36.8 C)] 98 F (36.7 C) (10/08 0000) Pulse Rate:  [58-67] 67 (10/07 2059) Resp:  [15-20] 15 (10/08 0000) BP: (120-201)/(57-84) 120/57 (10/08 0000) SpO2:  [92 %-100 %] 92 % (10/08 0000) Weight:  [108 lb 0.4 oz (49 kg)-109 lb (49.4 kg)] 108 lb 0.4 oz (49 kg) (10/07 2120)  Intake/Output Summary (Last 24 hours) at 01/25/16 0106 Last data filed at 01/25/16 0000  Gross per 24 hour  Intake              200 ml  Output              250 ml  Net              -50 ml   Physical exam Vitals:   01/24/16 2300 01/25/16 0000  BP: (!) 152/68 (!) 120/57  Pulse:    Resp: 16 15  Temp:  98 F (36.7 C)  Gen: Eldery woman, NAD Pulm: Lower lobe rhonchi Card: Normal S1/S2 Abd: Soft Skin: No  rashes Extrem: No clubbing   LAB RESULTS BMET    Component Value Date/Time   NA 140 01/24/2016 1618   K 3.7 01/24/2016 1618   CL 103 01/24/2016 1618   CO2 29 01/24/2016 1618   GLUCOSE 90 01/24/2016 1618   BUN 13 01/24/2016 1618   CREATININE 0.72 01/24/2016 1618   CALCIUM 9.8 01/24/2016 1618   GFRNONAA >60 01/24/2016 1618   GFRAA >60 01/24/2016 1618   CBC    Component Value Date/Time   WBC 8.0 01/24/2016 1618   RBC 4.41 01/24/2016 1618   HGB 13.4 01/24/2016 1618   HGB 12.0 12/02/2011 1111   HCT 40.6 01/24/2016 1618   HCT 36.0 12/02/2011 1111   PLT 200 01/24/2016 1618   PLT 170 12/02/2011 1111   MCV 92.1 01/24/2016 1618   MCV 92.3 12/02/2011 1111   MCH 30.4 01/24/2016 1618   MCHC 33.0 01/24/2016 1618   RDW 12.7 01/24/2016 1618   RDW 13.3 12/02/2011 1111   LYMPHSABS 2.1 01/24/2016 1618   LYMPHSABS 1.4 12/02/2011 1111    MONOABS 0.5 01/24/2016 1618   MONOABS 0.5 12/02/2011 1111   EOSABS 0.1 01/24/2016 1618   EOSABS 0.1 12/02/2011 1111   BASOSABS 0.0 01/24/2016 1618   BASOSABS 0.0 12/02/2011 1111   ABG No results found for: PHART, HCO3, TCO2, ACIDBASEDEF, O2SAT Radiology Chest CT: Scattered nodules, with small tree-in-bud opacities, as well as area of ground-glass opacity in the lingula  Bronchiectasis exacerbation with hemoptysis:  No indication for bronchoscopy, will obtain sputum culture and treat with doxycyline and ceftazidime. Narrow based on culture results.  Luz Brazen, MD Pulmonary & Critical Care Medicine January 25, 2016, 1:20 AM

## 2016-01-26 LAB — CBC
HEMATOCRIT: 37.8 % (ref 36.0–46.0)
HEMOGLOBIN: 12.8 g/dL (ref 12.0–15.0)
MCH: 30.6 pg (ref 26.0–34.0)
MCHC: 33.9 g/dL (ref 30.0–36.0)
MCV: 90.4 fL (ref 78.0–100.0)
Platelets: 190 10*3/uL (ref 150–400)
RBC: 4.18 MIL/uL (ref 3.87–5.11)
RDW: 12.5 % (ref 11.5–15.5)
WBC: 11.3 10*3/uL — ABNORMAL HIGH (ref 4.0–10.5)

## 2016-01-26 LAB — BASIC METABOLIC PANEL
ANION GAP: 6 (ref 5–15)
BUN: 14 mg/dL (ref 6–20)
CO2: 28 mmol/L (ref 22–32)
Calcium: 9.7 mg/dL (ref 8.9–10.3)
Chloride: 105 mmol/L (ref 101–111)
Creatinine, Ser: 0.51 mg/dL (ref 0.44–1.00)
GLUCOSE: 147 mg/dL — AB (ref 65–99)
POTASSIUM: 3.7 mmol/L (ref 3.5–5.1)
Sodium: 139 mmol/L (ref 135–145)

## 2016-01-26 LAB — MAGNESIUM: Magnesium: 2 mg/dL (ref 1.7–2.4)

## 2016-01-26 LAB — PROTIME-INR
INR: 1.05
Prothrombin Time: 13.7 seconds (ref 11.4–15.2)

## 2016-01-26 MED ORDER — PANTOPRAZOLE SODIUM 40 MG PO TBEC
40.0000 mg | DELAYED_RELEASE_TABLET | Freq: Two times a day (BID) | ORAL | Status: DC
  Administered 2016-01-26 – 2016-01-27 (×2): 40 mg via ORAL
  Filled 2016-01-26 (×2): qty 1

## 2016-01-26 MED ORDER — IPRATROPIUM-ALBUTEROL 0.5-2.5 (3) MG/3ML IN SOLN
3.0000 mL | Freq: Four times a day (QID) | RESPIRATORY_TRACT | Status: DC
Start: 2016-01-26 — End: 2016-01-27
  Administered 2016-01-26 – 2016-01-27 (×3): 3 mL via RESPIRATORY_TRACT
  Filled 2016-01-26 (×3): qty 3

## 2016-01-26 MED ORDER — FAMOTIDINE 20 MG PO TABS
20.0000 mg | ORAL_TABLET | Freq: Every day | ORAL | Status: DC
  Administered 2016-01-26: 20 mg via ORAL
  Filled 2016-01-26: qty 1

## 2016-01-26 MED ORDER — METHYLPREDNISOLONE SODIUM SUCC 40 MG IJ SOLR
20.0000 mg | Freq: Three times a day (TID) | INTRAMUSCULAR | Status: DC
  Administered 2016-01-26 – 2016-01-27 (×3): 20 mg via INTRAVENOUS
  Filled 2016-01-26 (×3): qty 1

## 2016-01-26 NOTE — Progress Notes (Signed)
PCCM PROGRESS NOTE  Admission Date: 01/24/2016 Consult Date: 01/24/2016 Referring Provider: Dr. Roel Cluck, [facility]  CC: Hemoptysis  Description: 65 yo female presented with 1.5 weeks of hemoptysis.  She developed dyspnea, and chest pain.  Failed outpt Abx tx.  She is followed by Dr. Vaughan Browner for bronchiectasis.  She also has hx of asthmatic bronchitis, sinusitis, seasonal allergies, and reflux.  Subjective: Denies further episode of hemoptysis since admission.  Has lower rib discomfort when coughing.  Has sinus congestion >> chronic.  Reported episode of worsening reflux prior to developing hemoptysis.  She sleeps on Lt side usually due to neck pain.  Vital Signs: BP (!) 119/47   Pulse 64   Temp 98 F (36.7 C) (Oral)   Resp (!) 21   Ht 5\' 3"  (1.6 m)   Wt 108 lb 0.4 oz (49 kg)   SpO2 97%   BMI 19.14 kg/m   General: thin, pleasant HEENT: no sinus tenderness, no stridor Cardiac: regular, no murmur Chest: no wheeze/rales Abd: soft, non tender Ext: no edema Skin: no rashes   CMP Latest Ref Rng & Units 01/26/2016 01/25/2016 01/24/2016  Glucose 65 - 99 mg/dL 147(H) 83 90  BUN 6 - 20 mg/dL 14 12 13   Creatinine 0.44 - 1.00 mg/dL 0.51 0.68 0.72  Sodium 135 - 145 mmol/L 139 141 140  Potassium 3.5 - 5.1 mmol/L 3.7 3.6 3.7  Chloride 101 - 111 mmol/L 105 106 103  CO2 22 - 32 mmol/L 28 28 29   Calcium 8.9 - 10.3 mg/dL 9.7 9.0 9.8  Total Protein 6.5 - 8.1 g/dL - 6.2(L) 7.9  Total Bilirubin 0.3 - 1.2 mg/dL - 0.8 0.9  Alkaline Phos 38 - 126 U/L - 76 95  AST 15 - 41 U/L - 22 27  ALT 14 - 54 U/L - 14 17    CBC Latest Ref Rng & Units 01/26/2016 01/25/2016 01/24/2016  WBC 4.0 - 10.5 K/uL 11.3(H) 6.3 8.0  Hemoglobin 12.0 - 15.0 g/dL 12.8 12.0 13.4  Hematocrit 36.0 - 46.0 % 37.8 36.0 40.6  Platelets 150 - 400 K/uL 190 158 200   Dg Chest 2 View  Result Date: 01/24/2016 CLINICAL DATA:  Recurring hemoptysis. History of hypertension and breast cancer. EXAM: CHEST  2 VIEW COMPARISON:  01/12/2016 chest  radiograph, chest CT 11/15/2014 FINDINGS: Cardiomediastinal silhouette is normal. Mediastinal contours appear intact. Put mild tortuosity of the aorta. There is no evidence of focal airspace consolidation, pleural effusion or pneumothorax. Persistent mild upper lobe predominant bronchiectasis and pleural parenchymal scarring of the apices is seen. The previously demonstrated by CT soft tissue nodules are not well seen radiographically. Few mm calcified nodules in the left lower lobe likely represent granulomas. There is a hyperdense asymmetry overlying the right lung apex with uncertain significance. Osseous structures are without acute abnormality. Soft tissues are grossly normal. IMPRESSION: Hyperdense asymmetry overlying the right lung apex. This finding is in an area where several bony structures overlap, and therefore its significance is uncertain. The previously demonstrated by CT soft tissue nodules in the left upper lobe are not well seen radiographically. Noncontrasted chest CT will provide more adequate follow-up of these findings. Electronically Signed   By: Fidela Salisbury M.D.   On: 01/24/2016 16:21   Ct Angio Chest Pe W And/or Wo Contrast  Result Date: 01/24/2016 CLINICAL DATA:  Bronchiectasis. Evaluate for pulmonary embolus. Hemoptysis. History of breast cancer. EXAM: CT ANGIOGRAPHY CHEST WITH CONTRAST TECHNIQUE: Multidetector CT imaging of the chest was performed using the standard  protocol during bolus administration of intravenous contrast. Multiplanar CT image reconstructions and MIPs were obtained to evaluate the vascular anatomy. CONTRAST:  100 cc Isovue 370 intravenously. COMPARISON:  Chest radiograph from the same day, chest CT 11/15/2014. FINDINGS: Cardiovascular: Satisfactory opacification of the pulmonary arteries to the segmental level. No evidence of pulmonary embolism. Normal heart size. No pericardial effusion. Mild calcific coronary artery disease. Post left thyroidectomy. Mild  enlargement of the esophagus. Mediastinum/Nodes: No enlarged mediastinal, hilar, or axillary lymph nodes. Calcified left hilar lymph nodes are stable. Right thyroid gland, and trachea demonstrate no significant findings. Lungs/Pleura: The previously described 3 discrete soft tissue nodules in the left upper lobe measuring less than 7 mm are stable. There are multiple areas of bronchiectasis with soft tissue filling the dilated distal bronchi, stable. There has been however interval development of more acute appearing predominantly peribronchial ground-glass opacities throughout the left lung. Prominent bronchiectasis and atelectatic changes are seen in the left upper lobe. 17 mm soft tissue peribronchial thickening is seen adjacent to un area of cylindrical bronchiectasis, image 47/91, sequence 7. Upper Abdomen: No acute abnormality. Musculoskeletal: No chest wall abnormality. No acute or significant osseous findings. Review of the MIP images confirms the above findings. IMPRESSION: No evidence of pulmonary embolus. Mild calcific coronary artery disease. Stable appearance of 3 discrete soft tissue nodules in the left upper lobe, stable from July 2016, and therefore likely benign in etiology. Multiple areas of chronic bronchiectasis with soft tissue filling dilated bronchi, likely representing mucous impaction. Endobronchial lesions although possible are favored less likely. New area of bronchiectasis with soft tissue peribronchial thickening in the left upper lobe, with adjacent ground-glass airspace consolidation. Other scattered areas of tree-in-bud opacities throughout the left lung. These findings likely represent acute infectious/ inflammatory changes such as bronchiolitis. Interstitial spread of metastatic disease, especially in the left upper lobe, can however have similar appearance. Electronically Signed   By: Fidela Salisbury M.D.   On: 01/24/2016 21:40    Cultures: Sputum 10/07 >>    Antibiotics: Tressie Ellis 10/07 >> Doxycycline 10/07 >>   Summary: She has hemoptysis with Lt sided GGO in setting of BTX.  She does not have other typical findings to suggest acute infectious process.  She reports worsening reflux symptoms prior to developing hemoptysis, and tends to sleep on her Lt side >> her radiographic findings correlate with possible reflux causing airway irritation and hemoptysis.  Assessment/plan: Hemoptysis with Lt sided GGO in setting of BTX. - continue Abx - f/u cx results - defer bronchoscopy for now  Asthmatic bronchitis. - Wean solumedrol - continue scheduled BDs  GERD. - protonix BD and zantac in evening  Pulmonary nodules. - f/u imaging studies as outpt  Chesley Mires, MD [place] 01/26/2016, 9:25 AM Pager:  [phone] After 3pm call: [phone]

## 2016-01-26 NOTE — Progress Notes (Signed)
PROGRESS NOTE    Lindsay Schultz  [id number] DOB: Mar 30, 1951 DOA: 01/24/2016 PCP: Walker Kehr, MD    Brief Narrative:  Patient is a pleasant 65 year old female prior history of breast cancer. Also history of bronchiectasis and hemoptysis recently treated with a 5 day course of doxycycline with some clinical improvement. Patient however started experiencing some hemoptysis on the morning of admission and subsequently presented to the ED. Patient was admitted placing the step down unit for closer monitoring for hemoptysis and pulmonary consulted. Patient for probable bronchoscopy 01/26/2016.   Assessment & Plan:   Principal Problem:   Bronchiectasis with (acute) exacerbation (HCC) Active Problems:   Hemoptysis   Essential hypertension   GERD   Trigeminal neuralgia pain  #1 bronchiectasis with an acute exacerbation with hemoptysis Questionable etiology. Per pulmonary source of hemoptysis likely left upper lobe per CT chest findings. Hemoptysis improved. Patient denies any shortness of breath. No further hemoptysis. Continue empiric antibiotics of doxycycline and Fortaz, Mucinex, IV steroids and duo nebs per pulmonary. Patient for possible bronchoscopy today, 01/26/2016 per pulmonary. If patient develops massive hemoptysis will need to consult with IR for possible embolization. Pulmonary/CCM following and appreciate input and recommendations.  #2 hypertension Stable. Continue home regimen and overall.  #3 gastroesophageal reflux disease Pepcid.  #4 trigeminal neuralgia Stable.   DVT prophylaxis: SCDs Code Status: Full Family Communication: Updated patient. No family at bedside. Disposition Plan: Home once hemoptysis has resolved and medically stable and per pulmonary.   Consultants:   Pulmonary: Dr. Ancil Linsey 01/24/2016  Procedures:   CT angiogram chest 01/24/2016  Chest x-ray 01/24/2016  Antimicrobials:   IV Tressie Ellis 01/24/2016  Oral doxycycline  01/24/2016   Subjective: Patient sitting up in bed. No further episodes of hemoptysis. Patient denies CP. No SOB.   Objective: Vitals:   01/26/16 0600 01/26/16 0700 01/26/16 0800 01/26/16 0838  BP: (!) 119/47     Pulse:      Resp: 16 16 (!) 21   Temp:      TempSrc:      SpO2: 96% 97% 96% 97%  Weight:      Height:        Intake/Output Summary (Last 24 hours) at 01/26/16 0849 Last data filed at 01/26/16 0600  Gross per 24 hour  Intake            732.5 ml  Output             1325 ml  Net           -592.5 ml   Filed Weights   01/24/16 1340 01/24/16 1529 01/24/16 2120  Weight: 49.4 kg (109 lb) 49.4 kg (109 lb) 49 kg (108 lb 0.4 oz)    Examination:  General exam: Appears calm and comfortable  Respiratory system: Clear to auscultation. Respiratory effort normal. Cardiovascular system: S1 & S2 heard, RRR. No JVD, murmurs, rubs, gallops or clicks. No pedal edema. Gastrointestinal system: Abdomen is nondistended, soft and nontender. No organomegaly or masses felt. Normal bowel sounds heard. Central nervous system: Alert and oriented. No focal neurological deficits. Extremities: Symmetric 5 x 5 power. Skin: No rashes, lesions or ulcers Psychiatry: Judgement and insight appear normal. Mood & affect appropriate.     Data Reviewed: I have personally reviewed following labs and imaging studies  CBC:  Recent Labs Lab 01/24/16 1618 01/25/16 0604 01/26/16 0324  WBC 8.0 6.3 11.3*  NEUTROABS 5.2  --   --   HGB 13.4 12.0 12.8  HCT 40.6 36.0 37.8  MCV 92.1 92.3 90.4  PLT 200 158 [id number]   Basic Metabolic Panel:  Recent Labs Lab 01/24/16 1618 01/25/16 0604 01/26/16 0324  NA 140 141 139  K 3.7 3.6 3.7  CL 103 106 105  CO2 29 28 28   GLUCOSE 90 83 147*  BUN 13 12 14   CREATININE 0.72 0.68 0.51  CALCIUM 9.8 9.0 9.7  MG  --  2.0 2.0  PHOS  --  3.5  --    GFR: Estimated Creatinine Clearance: 55 mL/min (by C-G formula based on SCr of 0.51 mg/dL). Liver Function  Tests:  Recent Labs Lab 01/24/16 1618 01/25/16 0604  AST 27 22  ALT 17 14  ALKPHOS 95 76  BILITOT 0.9 0.8  PROT 7.9 6.2*  ALBUMIN 4.7 3.7   No results for input(s): LIPASE, AMYLASE in the last 168 hours. No results for input(s): AMMONIA in the last 168 hours. Coagulation Profile:  Recent Labs Lab 01/26/16 0324  INR 1.05   Cardiac Enzymes: No results for input(s): CKTOTAL, CKMB, CKMBINDEX, TROPONINI in the last 168 hours. BNP (last 3 results) No results for input(s): PROBNP in the last 8760 hours. HbA1C: No results for input(s): HGBA1C in the last 72 hours. CBG: No results for input(s): GLUCAP in the last 168 hours. Lipid Profile: No results for input(s): CHOL, HDL, LDLCALC, TRIG, CHOLHDL, LDLDIRECT in the last 72 hours. Thyroid Function Tests:  Recent Labs  01/25/16 0604  TSH 2.394   Anemia Panel: No results for input(s): VITAMINB12, FOLATE, FERRITIN, TIBC, IRON, RETICCTPCT in the last 72 hours. Sepsis Labs: No results for input(s): PROCALCITON, LATICACIDVEN in the last 168 hours.  Recent Results (from the past 240 hour(s))  MRSA PCR Screening     Status: None   Collection Time: 01/24/16  9:12 PM  Result Value Ref Range Status   MRSA by PCR NEGATIVE NEGATIVE Final    Comment:        The GeneXpert MRSA Assay (FDA approved for NASAL specimens only), is one component of a comprehensive MRSA colonization surveillance program. It is not intended to diagnose MRSA infection nor to guide or monitor treatment for MRSA infections.   Culture, expectorated sputum-assessment     Status: None   Collection Time: 01/24/16 10:42 PM  Result Value Ref Range Status   Specimen Description SPUTUM  Final   Special Requests Normal  Final   Sputum evaluation   Final    THIS SPECIMEN IS ACCEPTABLE. RESPIRATORY CULTURE REPORT TO FOLLOW.   Report Status 01/24/2016 FINAL  Final  Culture, respiratory (NON-Expectorated)     Status: None (Preliminary result)   Collection Time:  01/24/16 10:42 PM  Result Value Ref Range Status   Specimen Description SPU  Final   Special Requests NONE  Final   Gram Stain   Final    RARE SQUAMOUS EPITHELIAL CELLS PRESENT FEW WBC PRESENT,BOTH PMN AND MONONUCLEAR FEW GRAM POSITIVE COCCI IN PAIRS FEW GRAM NEGATIVE COCCI IN PAIRS FEW GRAM NEGATIVE RODS Performed at [facility]    Culture PENDING  Incomplete   Report Status PENDING  Incomplete         Radiology Studies: Dg Chest 2 View  Result Date: 01/24/2016 CLINICAL DATA:  Recurring hemoptysis. History of hypertension and breast cancer. EXAM: CHEST  2 VIEW COMPARISON:  01/12/2016 chest radiograph, chest CT 11/15/2014 FINDINGS: Cardiomediastinal silhouette is normal. Mediastinal contours appear intact. Put mild tortuosity of the aorta. There is no evidence of focal airspace consolidation, pleural effusion or pneumothorax. Persistent  mild upper lobe predominant bronchiectasis and pleural parenchymal scarring of the apices is seen. The previously demonstrated by CT soft tissue nodules are not well seen radiographically. Few mm calcified nodules in the left lower lobe likely represent granulomas. There is a hyperdense asymmetry overlying the right lung apex with uncertain significance. Osseous structures are without acute abnormality. Soft tissues are grossly normal. IMPRESSION: Hyperdense asymmetry overlying the right lung apex. This finding is in an area where several bony structures overlap, and therefore its significance is uncertain. The previously demonstrated by CT soft tissue nodules in the left upper lobe are not well seen radiographically. Noncontrasted chest CT will provide more adequate follow-up of these findings. Electronically Signed   By: Fidela Salisbury M.D.   On: 01/24/2016 16:21   Ct Angio Chest Pe W And/or Wo Contrast  Result Date: 01/24/2016 CLINICAL DATA:  Bronchiectasis. Evaluate for pulmonary embolus. Hemoptysis. History of breast cancer. EXAM: CT  ANGIOGRAPHY CHEST WITH CONTRAST TECHNIQUE: Multidetector CT imaging of the chest was performed using the standard protocol during bolus administration of intravenous contrast. Multiplanar CT image reconstructions and MIPs were obtained to evaluate the vascular anatomy. CONTRAST:  100 cc Isovue 370 intravenously. COMPARISON:  Chest radiograph from the same day, chest CT 11/15/2014. FINDINGS: Cardiovascular: Satisfactory opacification of the pulmonary arteries to the segmental level. No evidence of pulmonary embolism. Normal heart size. No pericardial effusion. Mild calcific coronary artery disease. Post left thyroidectomy. Mild enlargement of the esophagus. Mediastinum/Nodes: No enlarged mediastinal, hilar, or axillary lymph nodes. Calcified left hilar lymph nodes are stable. Right thyroid gland, and trachea demonstrate no significant findings. Lungs/Pleura: The previously described 3 discrete soft tissue nodules in the left upper lobe measuring less than 7 mm are stable. There are multiple areas of bronchiectasis with soft tissue filling the dilated distal bronchi, stable. There has been however interval development of more acute appearing predominantly peribronchial ground-glass opacities throughout the left lung. Prominent bronchiectasis and atelectatic changes are seen in the left upper lobe. 17 mm soft tissue peribronchial thickening is seen adjacent to un area of cylindrical bronchiectasis, image 47/91, sequence 7. Upper Abdomen: No acute abnormality. Musculoskeletal: No chest wall abnormality. No acute or significant osseous findings. Review of the MIP images confirms the above findings. IMPRESSION: No evidence of pulmonary embolus. Mild calcific coronary artery disease. Stable appearance of 3 discrete soft tissue nodules in the left upper lobe, stable from July 2016, and therefore likely benign in etiology. Multiple areas of chronic bronchiectasis with soft tissue filling dilated bronchi, likely representing  mucous impaction. Endobronchial lesions although possible are favored less likely. New area of bronchiectasis with soft tissue peribronchial thickening in the left upper lobe, with adjacent ground-glass airspace consolidation. Other scattered areas of tree-in-bud opacities throughout the left lung. These findings likely represent acute infectious/ inflammatory changes such as bronchiolitis. Interstitial spread of metastatic disease, especially in the left upper lobe, can however have similar appearance. Electronically Signed   By: Fidela Salisbury M.D.   On: 01/24/2016 21:40        Scheduled Meds: . cefTAZidime (FORTAZ)  IV  1 g Intravenous Q8H  . doxycycline  100 mg Oral Q12H  . guaiFENesin  600 mg Oral BID  . ipratropium-albuterol  3 mL Nebulization Q6H  . methylPREDNISolone (SOLU-MEDROL) injection  60 mg Intravenous Q8H  . nadolol  20 mg Oral BID  . sodium chloride flush  3 mL Intravenous Q12H   Continuous Infusions:    LOS: 2 days    Time spent:  62 minutes    Paulmichael Schreck, MD [facility] Pager [phone]   If 7PM-7AM, please contact night-coverage [email or web address] Password [email or web address] 01/26/2016, 8:49 AM

## 2016-01-26 NOTE — Care Management Note (Signed)
Case Management Note  Patient Details  Name: Lindsay Schultz MRN: [id number] Date of Birth: 11-Aug-1950  Subjective/Objective:     Copd, chronic lung diease,failed outpt therapy, hemoptysis                Action/Plan: home when stable   Expected Discharge Date:                  Expected Discharge Plan:  Home/Self Care  In-House Referral:     Discharge planning Services     Post Acute Care Choice:    Choice offered to:     DME Arranged:    DME Agency:     HH Arranged:    HH Agency:     Status of Service:  In process, will continue to follow  If discussed at Long Length of Stay Meetings, dates discussed:    Additional Comments:Date:  January 26, 2016 Chart reviewed for concurrent status and case management needs. Will continue to follow the patient for status change: Discharge Planning: following for needs Expected discharge date: [id number] Velva Harman, BSN, [employer], [place]  Leeroy Cha, RN 01/26/2016, 10:07 AM

## 2016-01-27 ENCOUNTER — Inpatient Hospital Stay (HOSPITAL_COMMUNITY): Payer: 59

## 2016-01-27 DIAGNOSIS — D72829 Elevated white blood cell count, unspecified: Secondary | ICD-10-CM | POA: Diagnosis not present

## 2016-01-27 DIAGNOSIS — G500 Trigeminal neuralgia: Secondary | ICD-10-CM

## 2016-01-27 LAB — CULTURE, RESPIRATORY: CULTURE: NORMAL

## 2016-01-27 LAB — BASIC METABOLIC PANEL
ANION GAP: 8 (ref 5–15)
BUN: 21 mg/dL — ABNORMAL HIGH (ref 6–20)
CALCIUM: 9.4 mg/dL (ref 8.9–10.3)
CHLORIDE: 104 mmol/L (ref 101–111)
CO2: 27 mmol/L (ref 22–32)
CREATININE: 0.66 mg/dL (ref 0.44–1.00)
GFR calc Af Amer: >60 mL/min (ref >60)
Glucose, Bld: 131 mg/dL — ABNORMAL HIGH (ref 65–99)
POTASSIUM: 3.7 mmol/L (ref 3.5–5.1)
SODIUM: 139 mmol/L (ref 135–145)

## 2016-01-27 LAB — CBC
HCT: 37 % (ref 36.0–46.0)
HEMOGLOBIN: 12.1 g/dL (ref 12.0–15.0)
MCH: 30.3 pg (ref 26.0–34.0)
MCHC: 32.7 g/dL (ref 30.0–36.0)
MCV: 92.7 fL (ref 78.0–100.0)
PLATELETS: 222 10*3/uL (ref 150–400)
RBC: 3.99 MIL/uL (ref 3.87–5.11)
RDW: 13.1 % (ref 11.5–15.5)
WBC: 18 10*3/uL — ABNORMAL HIGH (ref 4.0–10.5)

## 2016-01-27 MED ORDER — GUAIFENESIN ER 600 MG PO TB12: 600.0000 mg | ORAL_TABLET | Freq: Two times a day (BID) | ORAL | 0 refills | Status: AC

## 2016-01-27 MED ORDER — DOXYCYCLINE HYCLATE 100 MG PO TABS: 100.0000 mg | ORAL_TABLET | Freq: Two times a day (BID) | ORAL | 0 refills | Status: AC

## 2016-01-27 MED ORDER — FAMOTIDINE 20 MG PO TABS: 20.0000 mg | ORAL_TABLET | Freq: Every day | ORAL | 0 refills | Status: DC

## 2016-01-27 MED ORDER — PANTOPRAZOLE SODIUM 40 MG PO TBEC: 40.0000 mg | DELAYED_RELEASE_TABLET | Freq: Two times a day (BID) | ORAL | 3 refills | Status: DC

## 2016-01-27 MED ORDER — ALBUTEROL SULFATE HFA 108 (90 BASE) MCG/ACT IN AERS: 2.0000 | INHALATION_SPRAY | Freq: Four times a day (QID) | RESPIRATORY_TRACT | 0 refills | Status: DC | PRN

## 2016-01-27 MED ORDER — PREDNISONE 10 MG PO TABS: 10.0000 mg | ORAL_TABLET | Freq: Every day | ORAL | 0 refills | Status: DC

## 2016-01-27 MED ORDER — PREDNISONE 5 MG PO TABS: 30.0000 mg | ORAL_TABLET | Freq: Every day | ORAL | Status: DC

## 2016-01-27 NOTE — Care Management Note (Signed)
Case Management Note  Patient Details  Name: Lindsay Schultz MRN: [id number] Date of Birth: [birth year]  Subjective/Objective:65 y/o f admitted w/Bronchiectasis. From home.                    Action/Plan:d/c home no needs or orders.   Expected Discharge Date:                  Expected Discharge Plan:     In-House Referral:     Discharge planning Services  CM Consult  Post Acute Care Choice:    Choice offered to:     DME Arranged:    DME Agency:     HH Arranged:    [place] Agency:     Status of Service:  Completed, signed off  If discussed at [employer] of Stay Meetings, dates discussed:    Additional Comments:  Dessa Phi, RN 01/27/2016, 11:41 AM

## 2016-01-27 NOTE — Progress Notes (Addendum)
PCCM PROGRESS NOTE  Admission Date: 01/24/2016 Consult Date: 01/24/2016 Referring Provider: Dr. Roel Cluck, [facility]  CC: Hemoptysis  Description: 65 yo female presented with 1.5 weeks of hemoptysis.  She developed dyspnea, and chest pain.  Failed outpt Abx tx.  She is followed by Dr. Vaughan Browner for bronchiectasis.  She also has hx of asthmatic bronchitis, sinusitis, seasonal allergies, and reflux.  Subjective: No further episode of hemoptysis.  Denies chest pain, or dyspnea.  Vital Signs: BP (!) 125/57 (BP Location: Left Arm)   Pulse (!) 55   Temp 97.9 F (36.6 C) (Oral)   Resp 16   Ht 5\' 3"  (1.6 m)   Wt 108 lb 0.4 oz (49 kg)   SpO2 99%   BMI 19.14 kg/m   General: thin, pleasant HEENT: no sinus tenderness, no stridor Cardiac: regular, no murmur Chest: no wheeze/rales Abd: soft, non tender Ext: no edema Skin: no rashes   CMP Latest Ref Rng & Units 01/27/2016 01/26/2016 01/25/2016  Glucose 65 - 99 mg/dL 131(H) 147(H) 83  BUN 6 - 20 mg/dL 21(H) 14 12  Creatinine 0.44 - 1.00 mg/dL 0.66 0.51 0.68  Sodium 135 - 145 mmol/L 139 139 141  Potassium 3.5 - 5.1 mmol/L 3.7 3.7 3.6  Chloride 101 - 111 mmol/L 104 105 106  CO2 22 - 32 mmol/L 27 28 28   Calcium 8.9 - 10.3 mg/dL 9.4 9.7 9.0  Total Protein 6.5 - 8.1 g/dL - - 6.2(L)  Total Bilirubin 0.3 - 1.2 mg/dL - - 0.8  Alkaline Phos 38 - 126 U/L - - 76  AST 15 - 41 U/L - - 22  ALT 14 - 54 U/L - - 14    CBC Latest Ref Rng & Units 01/27/2016 01/26/2016 01/25/2016  WBC 4.0 - 10.5 K/uL 18.0(H) 11.3(H) 6.3  Hemoglobin 12.0 - 15.0 g/dL 12.1 12.8 12.0  Hematocrit 36.0 - 46.0 % 37.0 37.8 36.0  Platelets 150 - 400 K/uL 222 190 158    CXR 01/27/16 >> decreased ASD LUL  Cultures: Sputum 10/07 >>   Antibiotics: Tressie Ellis 10/07 >> Doxycycline 10/07 >>   Summary: She has hemoptysis with Lt sided GGO in setting of BTX.  She does not have other typical findings to suggest acute infectious process.  She reports worsening reflux symptoms prior to  developing hemoptysis, and tends to sleep on her Lt side >> her radiographic findings correlate with possible reflux causing airway irritation and hemoptysis.  Assessment/plan:  Hemoptysis with Lt sided GGO in setting of BTX. - can transition to oral Abx >> would complete 10 day course of doxycycline - f/u cx results - defer bronchoscopy for now  Asthmatic bronchitis. - can change to prednisone 30 mg daily and wean off as tolerated over next 6 days - prn BDs  GERD. - continue protonix BD and zantac in evening and re-assess as outpt  Pulmonary nodules. - f/u imaging studies as outpt  Okay for d/c home from pulmonary standpoint.  I have scheduled her for pulmonary office follow up with Dr. Vaughan Browner on Monday, October 16 10:45 AM.  Please call if additional help needed while she is in hospital.  Chesley Mires, MD [place] 01/27/2016, 8:55 AM Pager:  [phone] After 3pm call: [phone]

## 2016-01-27 NOTE — Progress Notes (Signed)
Patient given discharge, follow up, and medication instructions, verbalized understanding, IV and telemetry removed, family to transport home  

## 2016-01-27 NOTE — Discharge Summary (Addendum)
Physician Discharge Summary  Lindsay Schultz [id number] DOB: 12-Jul-1950 DOA: 01/24/2016  PCP: Lindsay Kehr, MD  Admit date: 01/24/2016 Discharge date: 01/27/2016  Time spent: 65 minutes  Recommendations for Outpatient Follow-up:  1. Follow-up with Lindsay Schultz 02/02/2016 at 10:45 AM. Patient will likely need a chest x-ray prior to appointment which has been set up by pulmonary. Patient also need a CBC on follow-up to follow-up on leukocytosis.   Discharge Diagnoses:  Principal Problem:   Bronchiectasis with (acute) exacerbation ([place]) Active Problems:   Hemoptysis   Essential hypertension   GERD   Trigeminal neuralgia pain   Leukocytosis   Discharge Condition: stable and improved  Diet recommendation: regular  Filed Weights   01/24/16 1340 01/24/16 1529 01/24/16 2120  Weight: 49.4 kg (109 lb) 49.4 kg (109 lb) 49 kg (108 lb 0.4 oz)    History of present illness:  Per Lindsay Schultz is a 65 y.o. female with medical history significant of bronchiectasis, HTN, trigeminal Neuralgia    Presented with intermittent hemoptysis for the past 1.5 weeks patient had been seen for the same by pulmonology clinic on 25th of September. She was prescribed Delsym and doxycycline with plan to follow up with pulmonology in one week.  Plan was for patient to have outpatient CT and bronchoscopy if symptoms did not resolve. Patient continued to have intermittent hemoptysis and presented to emergency department. Patient stated she completed her 5 day course of doxycycline. After completion of antibiotics, hemoptysis resumed she was coughing bright red blood. About a tablespoon at a time it had been getting worse from baseline. She stated symptoms associated shortness of breath chest pain which was worse with respirations and coughing.  She reported severe shortness of breath once coughing and hemoptysis started she had at least 3-4 episodes at home and 2 while in ER. She is not on oxygen at  home Regarding pertinent Chronic problems: chronic heaadache for which she takes [place].  She on Nadol for hx of HTN. She has hx of remote breast cancer sp radiation treatments and lumpectomy 10 years ago   IN ER:  Temp (24hrs), Avg:98 F (36.7 C), Min:97.8 F (36.6 C), Max:98.2 F (36.8 C) Examination continued to have hemoptysis satting 100% on room air pulse 61 blood pressure 160/65 WBC 8.0 hemoglobin 13.4 D-dimer 0.56 Chest x-ray showing hyperdensity symmetry of the line right lung apex recommend CT Following Medications were ordered in ER: Medications - No data to display  Hospital Course:  #1 bronchiectasis with an acute exacerbation with hemoptysis and left-sided GGO Questionable etiology. Per pulmonary source of hemoptysis likely left upper lobe per CT chest findings. It was also noted that patient reported worsening reflux symptoms  Prior to developing hemoptysis) asleep on the left side.  It was felt that radiographic findings correlated with possible reflux leading to airway irritation and hemoptysis per pulmonary. Patient was admitted placed empirically on iV Fortaz and oral doxycycline. Patient was placed on DuoNeb's,, Mucinex, IV steroids, pulmonary toilet. Patient was initially monitored in the stepdown unit closely and followed by pulmonary throughout the hospitalization. Patient's hemoptysis is improved and did not have any further episodes during the hospitalization. Bronchoscopy was subsequently deferred. Patient be discharged home on a prednisone taper as well as 8 more days of oral doxycycline to complete a ten-day course of treatment. Patient has been scheduled to follow-up with pulmonary in the outpatient setting.  #2 hypertension Stable. Patient was maintained on home regimen of nadolol during the hospitalization.  #3  gastroesophageal reflux disease Patient was maintained on Protonix twice daily as well as Pepcid at bedtime per recommendations from pulmonary.  Outpatient follow-up.  #4 trigeminal neuralgia Stable.  #5 pulmonary nodules Outpatient imaging studies per pulmonary.  #6 asthmatic bronchitis Patient was maintained on antibiotics as well as steroid taper and nebulizer treatments during the hospitalization. Patient improved clinically.  Patient be discharged home on a prednisone taper which have been weaned off over the next 6 days as well as a more days of oral antibiotics to complete a ten-day course of antibiotic treatment.  #7 leukocytosis Secondary to steroids.   Procedures:  CT angiogram chest 01/24/2016  Chest x-ray 01/24/2016   Consultations:  Pulmonary: Lindsay Schultz 01/24/2016   Discharge Exam: Vitals:   01/26/16 1957 01/27/16 0532  BP: 117/61 (!) 125/57  Pulse: 74 (!) 55  Resp:  16  Temp: 98.7 F (37.1 C) 97.9 F (36.6 C)    General: NAD Cardiovascular: RRR Respiratory: CTAB  Discharge Instructions   Discharge Instructions    Diet general    Complete by:  As directed    Increase activity slowly    Complete by:  As directed      Current Discharge Medication List    START taking these medications   Details  albuterol (PROVENTIL HFA;VENTOLIN HFA) 108 (90 Base) MCG/ACT inhaler Inhale 2 puffs into the lungs every 6 (six) hours as needed for wheezing or shortness of breath. Qty: 1 Inhaler, Refills: 0    doxycycline (VIBRA-TABS) 100 MG tablet Take 1 tablet (100 mg total) by mouth every 12 (twelve) hours. Qty: 16 tablet, Refills: 0    famotidine (PEPCID) 20 MG tablet Take 1 tablet (20 mg total) by mouth at bedtime. Qty: 30 tablet, Refills: 0    guaiFENesin (MUCINEX) 600 MG 12 hr tablet Take 1 tablet (600 mg total) by mouth 2 (two) times daily. Qty: 16 tablet, Refills: 0    pantoprazole (PROTONIX) 40 MG tablet Take 1 tablet (40 mg total) by mouth 2 (two) times daily before a meal. Qty: 60 tablet, Refills: 3    predniSONE (DELTASONE) 10 MG tablet Take 1-3 tablets (10-30 mg total) by mouth  daily with breakfast. Take 3 tablets daily (30mg ) x 2 days, then 2 tablets (20mg ) daily x 2 days, then 1 tablet (10mg ) daily x 2 days then stop. Qty: 12 tablet, Refills: 0      CONTINUE these medications which have NOT CHANGED   Details  butalbital-acetaminophen-caffeine (FIORICET, ESGIC) 50-325-40 MG tablet Take 1 tablet by mouth every 6 (six) hours as needed for headache. Qty: 100 tablet, Refills: 2    Cholecalciferol 2000 UNITS CAPS Take 5,000 Units by mouth 2 (two) times daily.     CRANBERRY FRUIT PO Take 1 each by mouth every evening.     Cyanocobalamin (VITAMIN B-12) 500 MCG SUBL 1 tab sl qd Qty: 100 tablet, Refills: 11    fluticasone (FLONASE) 50 MCG/ACT nasal spray Place 1 spray into both nostrils daily as needed. Qty: 16 g, Refills: 11    Garlic 123XX123 MG CAPS Take 1,000 mg by mouth every evening.     Multiple Vitamins-Minerals (MULTIVITAMIN WITH MINERALS) tablet Take 1 tablet by mouth every evening.     nadolol (CORGARD) 40 MG tablet Take 0.5 tablets (20 mg total) by mouth 2 (two) times daily. Qty: 60 tablet, Refills: 11    Pyridoxine HCl (VITAMIN B-6 PO) Take 100 mcg by mouth 2 (two) times daily.     TURMERIC PO Take 1  tablet by mouth 2 (two) times daily.     calcitonin, salmon, (MIACALCIN/FORTICAL) 200 UNIT/ACT nasal spray Place 1 spray into alternate nostrils daily. Qty: 3.7 mL, Refills: 11      STOP taking these medications     Respiratory Therapy Supplies (FLUTTER) DEVI        Allergies  Allergen Reactions  . Meloxicam Nausea And Vomiting    Upset stomach  . Paroxetine Other (See Comments)    Passed out  . Penicillins Other (See Comments)    passed out after a shot at 65 y.o  Has patient had a PCN reaction causing immediate rash, facial/tongue/throat swelling, SOB or lightheadedness with hypotension: no Has patient had a PCN reaction causing severe rash involving mucus membranes or skin necrosis:  no Has patient had a PCN reaction that required  hospitalization: no Has patient had a PCN reaction occurring within the last 10 years: no If all of the above answers are "NO", then may proceed with Cephalosporin use.   . Ciprofloxacin Rash     rash along the vein w/IV Cipro  . Clarithromycin Rash  . Zithromax [Azithromycin] Rash   Follow-up Information    Marshell Garfinkel, MD Follow up on 02/02/2016.   Specialty:  Pulmonary Disease Why:  Follow up with lung doctor at 10:45 AM.  You will need a chest xray at visit prior to seeing Lindsay. Weber Cooks information: [address] [place] [address] [phone]            The results of significant diagnostics from this hospitalization (including imaging, microbiology, ancillary and laboratory) are listed below for reference.    Significant Diagnostic Studies: Dg Chest 2 View  Result Date: 01/27/2016 CLINICAL DATA:  Hemoptysis.    Dyspnea and chest pain. EXAM: CHEST  2 VIEW COMPARISON:  Multiple priors. Most recent chest radiograph 01/24/2016. FINDINGS: The heart size and mediastinal contours are within normal limits. Both lungs are clear. The visualized skeletal structures are unremarkable. Moderate hyperinflation. IMPRESSION: No active cardiopulmonary disease.  Stable exam. Electronically Signed   By: Staci Righter M.D.   On: 01/27/2016 09:30   Dg Chest 2 View  Result Date: 01/24/2016 CLINICAL DATA:  Recurring hemoptysis. History of hypertension and breast cancer. EXAM: CHEST  2 VIEW COMPARISON:  01/12/2016 chest radiograph, chest CT 11/15/2014 FINDINGS: Cardiomediastinal silhouette is normal. Mediastinal contours appear intact. Put mild tortuosity of the aorta. There is no evidence of focal airspace consolidation, pleural effusion or pneumothorax. Persistent mild upper lobe predominant bronchiectasis and pleural parenchymal scarring of the apices is seen. The previously demonstrated by CT soft tissue nodules are not well seen radiographically. Few mm calcified nodules  in the left lower lobe likely represent granulomas. There is a hyperdense asymmetry overlying the right lung apex with uncertain significance. Osseous structures are without acute abnormality. Soft tissues are grossly normal. IMPRESSION: Hyperdense asymmetry overlying the right lung apex. This finding is in an area where several bony structures overlap, and therefore its significance is uncertain. The previously demonstrated by CT soft tissue nodules in the left upper lobe are not well seen radiographically. Noncontrasted chest CT will provide more adequate follow-up of these findings. Electronically Signed   By: Fidela Salisbury M.D.   On: 01/24/2016 16:21   Dg Chest 2 View  Addendum Date: 01/13/2016   ADDENDUM REPORT: 01/13/2016 09:49 ADDENDUM: The impression of this report should read: Small pulmonary nodules are again noted and appears stable interim most likely secondary to granulomas disease. No  acute cardiopulmonary disease identified. Electronically Signed   By: Marcello Moores  Register   On: 01/13/2016 09:49   Result Date: 01/13/2016 CLINICAL DATA:  Hemoptysis. EXAM: CHEST  2 VIEW COMPARISON:  CT 11/15/2014.  Chest x-ray 10/30/2014. FINDINGS: Mediastinum and hilar structures normal. Mediastinum and hilar structures are normal. Heart size stable. Pulmonary nodules present appear stable most likely secondary granulomas disease. No acute infiltrate. Biapical pleural thickening consistent scarring. No pleural effusion or pneumothorax. IMPRESSION: Small pulmonary nodules are again noted and appear stable and are most likely is secondary to metastatic disease . No acute cardiopulmonary disease noted. Electronically Signed: By: Marcello Moores  Register On: 01/13/2016 07:17   Ct Angio Chest Pe W And/or Wo Contrast  Result Date: 01/24/2016 CLINICAL DATA:  Bronchiectasis. Evaluate for pulmonary embolus. Hemoptysis. History of breast cancer. EXAM: CT ANGIOGRAPHY CHEST WITH CONTRAST TECHNIQUE: Multidetector CT imaging of  the chest was performed using the standard protocol during bolus administration of intravenous contrast. Multiplanar CT image reconstructions and MIPs were obtained to evaluate the vascular anatomy. CONTRAST:  100 cc Isovue 370 intravenously. COMPARISON:  Chest radiograph from the same day, chest CT 11/15/2014. FINDINGS: Cardiovascular: Satisfactory opacification of the pulmonary arteries to the segmental level. No evidence of pulmonary embolism. Normal heart size. No pericardial effusion. Mild calcific coronary artery disease. Post left thyroidectomy. Mild enlargement of the esophagus. Mediastinum/Nodes: No enlarged mediastinal, hilar, or axillary lymph nodes. Calcified left hilar lymph nodes are stable. Right thyroid gland, and trachea demonstrate no significant findings. Lungs/Pleura: The previously described 3 discrete soft tissue nodules in the left upper lobe measuring less than 7 mm are stable. There are multiple areas of bronchiectasis with soft tissue filling the dilated distal bronchi, stable. There has been however interval development of more acute appearing predominantly peribronchial ground-glass opacities throughout the left lung. Prominent bronchiectasis and atelectatic changes are seen in the left upper lobe. 17 mm soft tissue peribronchial thickening is seen adjacent to un area of cylindrical bronchiectasis, image 47/91, sequence 7. Upper Abdomen: No acute abnormality. Musculoskeletal: No chest wall abnormality. No acute or significant osseous findings. Review of the MIP images confirms the above findings. IMPRESSION: No evidence of pulmonary embolus. Mild calcific coronary artery disease. Stable appearance of 3 discrete soft tissue nodules in the left upper lobe, stable from July 2016, and therefore likely benign in etiology. Multiple areas of chronic bronchiectasis with soft tissue filling dilated bronchi, likely representing mucous impaction. Endobronchial lesions although possible are favored  less likely. New area of bronchiectasis with soft tissue peribronchial thickening in the left upper lobe, with adjacent ground-glass airspace consolidation. Other scattered areas of tree-in-bud opacities throughout the left lung. These findings likely represent acute infectious/ inflammatory changes such as bronchiolitis. Interstitial spread of metastatic disease, especially in the left upper lobe, can however have similar appearance. Electronically Signed   By: Fidela Salisbury M.D.   On: 01/24/2016 21:40    Microbiology: Recent Results (from the past 240 hour(s))  MRSA PCR Screening     Status: None   Collection Time: 01/24/16  9:12 PM  Result Value Ref Range Status   MRSA by PCR NEGATIVE NEGATIVE Final    Comment:        The GeneXpert MRSA Assay (FDA approved for NASAL specimens only), is one component of a comprehensive MRSA colonization surveillance program. It is not intended to diagnose MRSA infection nor to guide or monitor treatment for MRSA infections.   Culture, expectorated sputum-assessment     Status: None   Collection Time:  01/24/16 10:42 PM  Result Value Ref Range Status   Specimen Description SPUTUM  Final   Special Requests Normal  Final   Sputum evaluation   Final    THIS SPECIMEN IS ACCEPTABLE. RESPIRATORY CULTURE REPORT TO FOLLOW.   Report Status 01/24/2016 FINAL  Final  Culture, respiratory (NON-Expectorated)     Status: None   Collection Time: 01/24/16 10:42 PM  Result Value Ref Range Status   Specimen Description SPU  Final   Special Requests NONE  Final   Gram Stain   Final    RARE SQUAMOUS EPITHELIAL CELLS PRESENT FEW WBC PRESENT,BOTH PMN AND MONONUCLEAR FEW GRAM POSITIVE COCCI IN PAIRS FEW GRAM NEGATIVE COCCI IN PAIRS FEW GRAM NEGATIVE RODS    Culture   Final    Consistent with normal respiratory flora. Performed at [facility]    Report Status 01/27/2016 FINAL  Final     Labs: Basic Metabolic Panel:  Recent Labs Lab  01/24/16 1618 01/25/16 0604 01/26/16 0324 01/27/16 0507  NA 140 141 139 139  K 3.7 3.6 3.7 3.7  CL 103 106 105 104  CO2 29 28 28 27   GLUCOSE 90 83 147* 131*  BUN 13 12 14  21*  CREATININE 0.72 0.68 0.51 0.66  CALCIUM 9.8 9.0 9.7 9.4  MG  --  2.0 2.0  --   PHOS  --  3.5  --   --    Liver Function Tests:  Recent Labs Lab 01/24/16 1618 01/25/16 0604  AST 27 22  ALT 17 14  ALKPHOS 95 76  BILITOT 0.9 0.8  PROT 7.9 6.2*  ALBUMIN 4.7 3.7   No results for input(s): LIPASE, AMYLASE in the last 168 hours. No results for input(s): AMMONIA in the last 168 hours. CBC:  Recent Labs Lab 01/24/16 1618 01/25/16 0604 01/26/16 0324 01/27/16 0507  WBC 8.0 6.3 11.3* 18.0*  NEUTROABS 5.2  --   --   --   HGB 13.4 12.0 12.8 12.1  HCT 40.6 36.0 37.8 37.0  MCV 92.1 92.3 90.4 92.7  PLT 200 158 190 222   Cardiac Enzymes: No results for input(s): CKTOTAL, CKMB, CKMBINDEX, TROPONINI in the last 168 hours. BNP: BNP (last 3 results) No results for input(s): BNP in the last 8760 hours.  ProBNP (last 3 results) No results for input(s): PROBNP in the last 8760 hours.  CBG: No results for input(s): GLUCAP in the last 168 hours.     SignedIrine Seal MD.  [facility] 01/27/2016, 12:00 PM

## 2016-01-28 ENCOUNTER — Ambulatory Visit: Payer: 59 | Admitting: Pulmonary Disease

## 2016-02-02 ENCOUNTER — Ambulatory Visit (INDEPENDENT_AMBULATORY_CARE_PROVIDER_SITE_OTHER)
Admission: RE | Admit: 2016-02-02 | Discharge: 2016-02-02 | Disposition: A | Discharge status: Home / Self Care | Payer: 59 | Source: Ambulatory Visit | Attending: Pulmonary Disease | Admitting: Pulmonary Disease

## 2016-02-02 ENCOUNTER — Ambulatory Visit (INDEPENDENT_AMBULATORY_CARE_PROVIDER_SITE_OTHER): Payer: 59 | Admitting: Pulmonary Disease

## 2016-02-02 ENCOUNTER — Encounter: Payer: Self-pay | Admitting: Pulmonary Disease

## 2016-02-02 VITALS — BP 122/80 | HR 57 | Ht 63.0 in | Wt 112.0 lb

## 2016-02-02 DIAGNOSIS — J471 Bronchiectasis with (acute) exacerbation: Secondary | ICD-10-CM

## 2016-02-02 DIAGNOSIS — R042 Hemoptysis: Secondary | ICD-10-CM | POA: Diagnosis not present

## 2016-02-02 DIAGNOSIS — Z23 Encounter for immunization: Secondary | ICD-10-CM

## 2016-02-02 NOTE — Progress Notes (Signed)
Lindsay Schultz    [id number]    Sep 29, 1950  Primary Care Physician:Lindsay Plotnikov, MD  Referring Physician: Cassandria Anger, MD [place], [place] [address]  Chief complaint:   Follow up for Asthmatic Bronchitis Bronchiectasis Sinusitis  Seasonal allergies GERD  HPI: Lindsay Schultz is a 65 year old female with history of asthmatic bronchitis, sinusitis, and seasonal allergies.  She's had a workup for her bronchiectasis including CBC, Ig levels, sputum cultures, A1AT levels, RF which were normal. PFTs do not show any obstruction and very mild restriction and reduction in diffusion capacity. Her husband has recently been diagnosed with colon cancer s/p resection and chemo. Lindsay Schultz was stressed during all this but he is doing better now.  She has similar attacks about once a year. She states that the infection usually begins in the sinus and then moves into the lung. This usually resolves after course of antibiotics. She has H/O seasonal allergies (worst in fall and spring) and asthmatic bronchitis. Never smoked.   Interim history: She was seen in the clinic in September 2017 for cough, hemoptysis.She was treated with doxycycline and cough suppressant which did not help with the symptoms. She subsequently got hospitalized last week and given IV antibiotics, increase in acid suppression with Protonix and Pepcid. She is here for a follow-up. She says that her symptoms have resolved. She does not have any cough, sputum production, hemoptysis currently.   Outpatient Encounter Prescriptions as of 02/02/2016  Medication Sig  . albuterol (PROVENTIL HFA;VENTOLIN HFA) 108 (90 Base) MCG/ACT inhaler Inhale 2 puffs into the lungs every 6 (six) hours as needed for wheezing or shortness of breath.  . butalbital-acetaminophen-caffeine (FIORICET, ESGIC) 50-325-40 MG tablet Take 1 tablet by mouth every 6 (six) hours as needed for headache.  . calcitonin, salmon, (MIACALCIN/FORTICAL) 200  UNIT/ACT nasal spray Place 1 spray into alternate nostrils daily.  . Cholecalciferol 2000 UNITS CAPS Take 5,000 Units by mouth 2 (two) times daily.   Marland Kitchen CRANBERRY FRUIT PO Take 1 each by mouth every evening.   . Cyanocobalamin (VITAMIN B-12) 500 MCG SUBL 1 tab sl qd (Patient taking differently: Take 500 tablets by mouth 2 (two) times daily. )  . doxycycline (VIBRA-TABS) 100 MG tablet Take 1 tablet (100 mg total) by mouth every 12 (twelve) hours.  . famotidine (PEPCID) 20 MG tablet Take 1 tablet (20 mg total) by mouth at bedtime.  . fluticasone (FLONASE) 50 MCG/ACT nasal spray Place 1 spray into both nostrils daily as needed. (Patient taking differently: Place 1 spray into both nostrils daily as needed for allergies. )  . Garlic 123XX123 MG CAPS Take 1,000 mg by mouth every evening.   Marland Kitchen guaiFENesin (MUCINEX) 600 MG 12 hr tablet Take 1 tablet (600 mg total) by mouth 2 (two) times daily.  . Multiple Vitamins-Minerals (MULTIVITAMIN WITH MINERALS) tablet Take 1 tablet by mouth every evening.   . nadolol (CORGARD) 40 MG tablet Take 0.5 tablets (20 mg total) by mouth 2 (two) times daily.  . pantoprazole (PROTONIX) 40 MG tablet Take 1 tablet (40 mg total) by mouth 2 (two) times daily before a meal.  . Pyridoxine HCl (VITAMIN B-6 PO) Take 100 mcg by mouth 2 (two) times daily.   . TURMERIC PO Take 1 tablet by mouth 2 (two) times daily.   . predniSONE (DELTASONE) 10 MG tablet Take 1-3 tablets (10-30 mg total) by mouth daily with breakfast. Take 3 tablets daily (30mg ) x 2 days, then 2  tablets (20mg ) daily x 2 days, then 1 tablet (10mg ) daily x 2 days then stop. (Patient not taking: Reported on 02/02/2016)   No facility-administered encounter medications on file as of 02/02/2016.     Allergies as of 02/02/2016 - Review Complete 02/02/2016  Allergen Reaction Noted  . Meloxicam Nausea And Vomiting 09/26/2012  . Paroxetine Other (See Comments) 11/27/2013  . Penicillins Other (See Comments) 06/12/2007  .  Ciprofloxacin Rash 06/12/2007  . Clarithromycin Rash 06/12/2007  . Zithromax [azithromycin] Rash 10/30/2014    Past Medical History:  Diagnosis Date  . Allergic rhinitis   . Benign paroxysmal positional vertigo 06/14/2015  . Bronchiectasis ([place])   . GERD (gastroesophageal reflux disease)   . HTN (hypertension)   . Hx of breast cancer   . Osteopenia     Past Surgical History:  Procedure Laterality Date  . ABDOMINAL HYSTERECTOMY    . APPENDECTOMY    . BREAST LUMPECTOMY  2008   Left  . NASAL SINUS SURGERY  1997    Family History  Problem Relation Age of Onset  . Sjogren's syndrome Mother   . Arthritis Father 70    RA  . Emphysema Father   . Heart disease Father   . Hypertension Other   . Hodgkin's lymphoma Brother     Social History   Social History  . Marital status: Married    Spouse name: N/A  . Number of children: N/A  . Years of education: N/A   Occupational History  . Retired     Orthoptist   Social History Main Topics  . Smoking status: Never Smoker  . Smokeless tobacco: Never Used  . Alcohol use No  . Drug use: No  . Sexual activity: Yes   Other Topics Concern  . Not on file   Social History Narrative   Regular Exercise- yes     Review of systems: Review of Systems  Constitutional: Negative for fever and chills.  HENT: Negative.   Eyes: Negative for blurred vision.  Respiratory: as per HPI  Cardiovascular: Negative for chest pain and palpitations.  Gastrointestinal: Negative for vomiting, diarrhea, blood per rectum. Genitourinary: Negative for dysuria, urgency, frequency and hematuria.  Musculoskeletal: Negative for myalgias, back pain and joint pain.  Skin: Negative for itching and rash.  Neurological: Negative for dizziness, tremors, focal weakness, seizures and loss of consciousness.  Endo/Heme/Allergies: Negative for environmental allergies.  Psychiatric/Behavioral: Negative for depression, suicidal ideas and hallucinations.    All other systems reviewed and are negative.   Physical Exam: Blood pressure 122/80, pulse (!) 57, height 5\' 3"  (1.6 m), weight 112 lb (50.8 kg), SpO2 98 %. Gen:      No acute distress HEENT:  EOMI, sclera anicteric Neck:     No masses; no thyromegaly Lungs:    Clear to auscultation bilaterally; normal respiratory effort CV:         Regular rate and rhythm; no murmurs Abd:      + bowel sounds; soft, non-tender; no palpable masses, no distension Ext:    No edema; adequate peripheral perfusion Skin:      Warm and dry; no rash Neuro: alert and oriented x 3 Psych: normal mood and affect  Data Reviewed: Imaging CT scan 11/15/14 1. Multiple nodules in the left upper lobe. 2. Multiple areas of bronchiectasis with soft tissue filling the distal dilated bronchi bilaterally. The endobronchial lesions or mucoid impactions and could create these findings. 3. Findings of previous granulomatous disease. 4. I suspect the  nodules and bronchial impactions are benign inflammatory changes rather than metastatic disease.  CT scan 01/24/16 Stable pulmonary nodules, bilateral bronchiectasis. Left upper lobe ground glass opacities. Images reviewed. Chest x-ray 02/02/16. Hyperinflation, chronic bronchitis changes. vague left hilar opacity. Images reviewed  Labs 12/09/14 IgG-773 IgG 8-2 37 IgM-64 IgE-56 Rheumatoid factor < 10 All within normal limits.  AFB and fungal cultures- no acid-fast bacilli, Candida albicans.  PFTs (02/10/15) FVC 2.35 (74%) FEV1 1.79 (74%) F/F 77 TLC 79% DLCO 78% No obstruction. Mild reduction in lung volumes and diffusion capacity  Assessment:  #1 Bronchiectasis, Hemoptysis. Recent hospitalization for hemoptysis from bronchiectasis exacerbation, GERD. She may have chronic aspiration but she denies any symptoms at present. She is back to baseline now. I have encouraged her to start using her flutter valve again. She does not see much benefit to this and may need a  percussion vest. I will reassess at time of next visit. She is going on medicare in December as would prefer any new therapies to be ordered after that  She will need repeat imaging next year to make sure the GGO have resolved.   #2 Asthmatic  Bronchitis Her main symptoms are during spring and fall allergy. She is stable on just albuterol PRN inhaler  # 3 GERD She'll continue on double acid suppression with Protonix and Pepcid. We will reevaluate at next visit.  Plan/Recommendations: - Continue albuterol PRN - Continue protonix, pepcid - Call if hemoptysis reccurs.  Return in 2 months   Marshell Garfinkel MD [facility] Pager [phone] 02/02/2016, 11:15 AM  CC: Schultz, Evie Lacks, MD

## 2016-02-02 NOTE — Patient Instructions (Signed)
Continue using inhalers as prescribed. Continue using Protonix and Pepcid for acid suppression. Resume using your flutter valve. We will assess for a percussion vest at time of next visit. You will get a flu vaccination today. Return to clinic in 2 months.

## 2016-05-04 ENCOUNTER — Ambulatory Visit (INDEPENDENT_AMBULATORY_CARE_PROVIDER_SITE_OTHER): Payer: Medicare Other | Admitting: Pulmonary Disease

## 2016-05-04 ENCOUNTER — Encounter: Payer: Self-pay | Admitting: Pulmonary Disease

## 2016-05-04 VITALS — BP 152/80 | HR 54 | Ht 63.0 in | Wt 113.4 lb

## 2016-05-04 DIAGNOSIS — J471 Bronchiectasis with (acute) exacerbation: Secondary | ICD-10-CM | POA: Diagnosis not present

## 2016-05-04 NOTE — Progress Notes (Signed)
Lindsay Schultz    [id number]    05/03/1950  Primary Care Physician:Lindsay Plotnikov, MD  Referring Physician: Cassandria Anger, MD Lindsay Schultz, Lindsay Schultz [id number]  Chief complaint:   Follow up for Asthmatic Bronchitis Bronchiectasis Sinusitis  Seasonal allergies GERD  HPI: [name] is a 66 year old female with history of asthmatic bronchitis, sinusitis, and seasonal allergies.  She's had a workup for her bronchiectasis including CBC, Ig levels, sputum cultures, A1AT levels, RF which were normal. PFTs do not show any obstruction and very mild restriction and reduction in diffusion capacity. Her husband has recently diagnosed with colon cancer s/p resection and chemo in 2017. Indu was stressed during all this but he is doing better now with cancer in remission.  She has attacks of bronchiectasis exacerbations about once a year. She states that the infection usually begins in the sinus and then moves into the lung. This usually resolves after course of antibiotics. She has H/O seasonal allergies (worst in fall and spring) and asthmatic bronchitis. Never smoked.   She was seen in the clinic in September 2017 for cough, hemoptysis.She was treated with doxycycline and cough suppressant which did not help with the symptoms. She subsequently got hospitalized last week and given IV antibiotics, increase in acid suppression with Protonix and Pepcid.   Interim history: Since her hospitalization in August 2017 she continues to feel well. She stopped taking the anti acid medication for GERD. She still on the albuterol inhaler which she uses very rarely. She is using her flutter valve on a regular basis.  Outpatient Encounter Prescriptions as of 05/04/2016  Medication Sig  . albuterol (PROVENTIL HFA;VENTOLIN HFA) 108 (90 Base) MCG/ACT inhaler Inhale 2 puffs into the lungs every 6 (six) hours as needed for wheezing or shortness of breath.  . butalbital-acetaminophen-caffeine  (FIORICET, ESGIC) 50-325-40 MG tablet Take 1 tablet by mouth every 6 (six) hours as needed for headache.  . calcitonin, salmon, (MIACALCIN/FORTICAL) 200 UNIT/ACT nasal spray Place 1 spray into alternate nostrils daily.  . Cholecalciferol 2000 UNITS CAPS Take 5,000 Units by mouth 2 (two) times daily.   Marland Kitchen CRANBERRY FRUIT PO Take 1 each by mouth every evening.   . Cyanocobalamin (VITAMIN B-12) 500 MCG SUBL 1 tab sl qd (Patient taking differently: Take 500 tablets by mouth 2 (two) times daily. )  . fluticasone (FLONASE) 50 MCG/ACT nasal spray Place 1 spray into both nostrils daily as needed. (Patient taking differently: Place 1 spray into both nostrils daily as needed for allergies. )  . Garlic 123XX123 MG CAPS Take 1,000 mg by mouth every evening.   . Multiple Vitamins-Minerals (MULTIVITAMIN WITH MINERALS) tablet Take 1 tablet by mouth every evening.   . nadolol (CORGARD) 40 MG tablet Take 0.5 tablets (20 mg total) by mouth 2 (two) times daily.  . pantoprazole (PROTONIX) 40 MG tablet Take 1 tablet (40 mg total) by mouth 2 (two) times daily before a meal.  . Pyridoxine HCl (VITAMIN B-6 PO) Take 100 mcg by mouth 2 (two) times daily.   . ranitidine (ZANTAC) 150 MG tablet Take 75 mg by mouth 2 (two) times daily.  . TURMERIC PO Take 1 tablet by mouth 2 (two) times daily.   . [DISCONTINUED] famotidine (PEPCID) 20 MG tablet Take 1 tablet (20 mg total) by mouth at bedtime.  . [DISCONTINUED] predniSONE (DELTASONE) 10 MG tablet Take 1-3 tablets (10-30 mg total) by mouth daily with breakfast. Take 3 tablets daily (30mg ) x  2 days, then 2 tablets (20mg ) daily x 2 days, then 1 tablet (10mg ) daily x 2 days then stop. (Patient not taking: Reported on 02/02/2016)   No facility-administered encounter medications on file as of 05/04/2016.     Allergies as of 05/04/2016 - Review Complete 05/04/2016  Allergen Reaction Noted  . Meloxicam Nausea And Vomiting 09/26/2012  . Paroxetine Other (See Comments) 11/27/2013  .  Penicillins Other (See Comments) 06/12/2007  . Ciprofloxacin Rash 06/12/2007  . Clarithromycin Rash 06/12/2007  . Zithromax [azithromycin] Rash 10/30/2014    Past Medical History:  Diagnosis Date  . Allergic rhinitis   . Benign paroxysmal positional vertigo 06/14/2015  . Bronchiectasis ([place])   . GERD (gastroesophageal reflux disease)   . HTN (hypertension)   . Hx of breast cancer   . Osteopenia     Past Surgical History:  Procedure Laterality Date  . ABDOMINAL HYSTERECTOMY    . APPENDECTOMY    . BREAST LUMPECTOMY  2008   Left  . NASAL SINUS SURGERY  1997    Family History  Problem Relation Age of Onset  . Sjogren's syndrome Mother   . Arthritis Father 10    RA  . Emphysema Father   . Heart disease Father   . Hypertension Other   . Hodgkin's lymphoma Brother     Social History   Social History  . Marital status: Married    Spouse name: N/A  . Number of children: N/A  . Years of education: N/A   Occupational History  . Retired     Orthoptist   Social History Main Topics  . Smoking status: Never Smoker  . Smokeless tobacco: Never Used  . Alcohol use No  . Drug use: No  . Sexual activity: Yes   Other Topics Concern  . Not on file   Social History Narrative   Regular Exercise- yes   Review of systems: Review of Systems  Constitutional: Negative for fever and chills.  HENT: Negative.   Eyes: Negative for blurred vision.  Respiratory: as per HPI  Cardiovascular: Negative for chest pain and palpitations.  Gastrointestinal: Negative for vomiting, diarrhea, blood per rectum. Genitourinary: Negative for dysuria, urgency, frequency and hematuria.  Musculoskeletal: Negative for myalgias, back pain and joint pain.  Skin: Negative for itching and rash.  Neurological: Negative for dizziness, tremors, focal weakness, seizures and loss of consciousness.  Endo/Heme/Allergies: Negative for environmental allergies.  Psychiatric/Behavioral: Negative for  depression, suicidal ideas and hallucinations.  All other systems reviewed and are negative.  Physical Exam: Blood pressure (!) 152/80, pulse (!) 54, height 5\' 3"  (1.6 m), weight 113 lb 6.4 oz (51.4 kg), SpO2 100 %. Gen:      No acute distress HEENT:  EOMI, sclera anicteric Neck:     No masses; no thyromegaly Lungs:    Clear to auscultation bilaterally; normal respiratory effort CV:         Regular rate and rhythm; no murmurs Abd:      + bowel sounds; soft, non-tender; no palpable masses, no distension Ext:    No edema; adequate peripheral perfusion Skin:      Warm and dry; no rash Neuro: alert and oriented x 3 Psych: normal mood and affect  Data Reviewed: Imaging CT scan 11/15/14 1. Multiple nodules in the left upper lobe. 2. Multiple areas of bronchiectasis with soft tissue filling the distal dilated bronchi bilaterally. The endobronchial lesions or mucoid impactions and could create these findings. 3. Findings of previous granulomatous disease. 4.  I suspect the nodules and bronchial impactions are benign inflammatory changes rather than metastatic disease. Images reviewed  CT scan 01/24/16 Stable pulmonary nodules, bilateral bronchiectasis. Left upper lobe ground glass opacities. Images reviewed. Chest x-ray 02/02/16. Hyperinflation, chronic bronchitis changes. vague left hilar opacity. Images reviewed   Labs 12/09/14 IgG-773 IgG 8-2 37 IgM-64 IgE-56 Rheumatoid factor < 10 All within normal limits.  AFB and fungal cultures- no acid-fast bacilli, Candida albicans.  PFTs (02/10/15) FVC 2.35 (74%) FEV1 1.79 (74%) F/F 77 TLC 79% DLCO 78% No obstruction. Mild reduction in lung volumes and diffusion capacity  Assessment:  #1 Bronchiectasis, Hemoptysis. Stable after hospitalization in 2017 for hemoptysis from bronchiectasis exacerbation. There is a question of chronic aspiration but she is asymptomatic now and feels back to baseline. She'll continue using her flutter  valve on a daily basis and albuterol when necessary.  #2 abnormal CT scan with lung nodules These are likely benign as they remained stable from 2016 to 2017. We'll repeat the CT scan without contrast on July of this year for a two-year follow-up. The CT scan will also be good for follow-up of groundglass opacities on her last scan.  #2 Asthmatic  Bronchitis She is asymptomatic with symptoms mostly during spring and fall allergy. She'll continue the albuterol when necessary.  # 3 GERD Off double acid suppression as she has stopped the Protonix and Pepcid. Reevaluate at next visit  Plan/Recommendations: - Continue albuterol PRN and flutter valve. - Follow up CT scan of chest in July  Apolonio Cutting MD [facility] Pager [phone] 05/04/2016, 11:16 AM  CC: Schultz, Evie Lacks, MD

## 2016-05-04 NOTE — Patient Instructions (Signed)
We will schedule you for a CT scan of the chest without contrast for the end of July 2018. Continue using the albuterol and flutter valve Follow-up in clinic after CT scan

## 2016-05-10 ENCOUNTER — Ambulatory Visit: Payer: 59 | Admitting: Internal Medicine

## 2016-05-14 ENCOUNTER — Other Ambulatory Visit: Payer: Self-pay | Admitting: Internal Medicine

## 2016-05-14 DIAGNOSIS — Z1231 Encounter for screening mammogram for malignant neoplasm of breast: Secondary | ICD-10-CM

## 2016-06-08 ENCOUNTER — Encounter: Payer: Self-pay | Admitting: Internal Medicine

## 2016-06-08 ENCOUNTER — Ambulatory Visit (INDEPENDENT_AMBULATORY_CARE_PROVIDER_SITE_OTHER): Payer: Medicare Other | Admitting: Internal Medicine

## 2016-06-08 DIAGNOSIS — E538 Deficiency of other specified B group vitamins: Secondary | ICD-10-CM

## 2016-06-08 DIAGNOSIS — H9201 Otalgia, right ear: Secondary | ICD-10-CM | POA: Insufficient documentation

## 2016-06-08 MED ORDER — NEOMYCIN-POLYMYXIN-HC 3.5-10000-1 OT SOLN: 3.0000 [drp] | Freq: Four times a day (QID) | OTIC | 3 refills | Status: DC

## 2016-06-08 MED ORDER — CEFDINIR 300 MG PO CAPS: 300.0000 mg | ORAL_CAPSULE | Freq: Two times a day (BID) | ORAL | 0 refills | Status: DC

## 2016-06-08 NOTE — Assessment & Plan Note (Signed)
On B12 

## 2016-06-08 NOTE — Assessment & Plan Note (Signed)
Cortisporin otic If not better - use Cefdinir (risk of allergy is <5% discussed).

## 2016-06-08 NOTE — Progress Notes (Signed)
Pre-visit discussion using our clinic review tool. No additional management support is needed unless otherwise documented below in the visit note.  

## 2016-06-08 NOTE — Progress Notes (Signed)
Subjective:  Patient ID: Lindsay Schultz, female    DOB: [birth year]  Age: 66 y.o. MRN: [id number]  CC: No chief complaint on file.   HPI Lindsay Schultz presents for URI sx's and R ear pain  Outpatient Medications Prior to Visit  Medication Sig Dispense Refill  . albuterol (PROVENTIL HFA;VENTOLIN HFA) 108 (90 Base) MCG/ACT inhaler Inhale 2 puffs into the lungs every 6 (six) hours as needed for wheezing or shortness of breath. 1 Inhaler 0  . butalbital-acetaminophen-caffeine (FIORICET, ESGIC) 50-325-40 MG tablet Take 1 tablet by mouth every 6 (six) hours as needed for headache. 100 tablet 2  . calcitonin, salmon, (MIACALCIN/FORTICAL) 200 UNIT/ACT nasal spray Place 1 spray into alternate nostrils daily. 3.7 mL 11  . Cholecalciferol 2000 UNITS CAPS Take 5,000 Units by mouth 2 (two) times daily.     Marland Kitchen CRANBERRY FRUIT PO Take 1 each by mouth every evening.     . Cyanocobalamin (VITAMIN B-12) 500 MCG SUBL 1 tab sl qd (Patient taking differently: Take 500 tablets by mouth 2 (two) times daily. ) 100 tablet 11  . fluticasone (FLONASE) 50 MCG/ACT nasal spray Place 1 spray into both nostrils daily as needed. (Patient taking differently: Place 1 spray into both nostrils daily as needed for allergies. ) 16 g 11  . Garlic 123XX123 MG CAPS Take 1,000 mg by mouth every evening.     . Multiple Vitamins-Minerals (MULTIVITAMIN WITH MINERALS) tablet Take 1 tablet by mouth every evening.     . nadolol (CORGARD) 40 MG tablet Take 0.5 tablets (20 mg total) by mouth 2 (two) times daily. 60 tablet 11  . pantoprazole (PROTONIX) 40 MG tablet Take 1 tablet (40 mg total) by mouth 2 (two) times daily before a meal. 60 tablet 3  . Pyridoxine HCl (VITAMIN B-6 PO) Take 100 mcg by mouth 2 (two) times daily.     . ranitidine (ZANTAC) 150 MG tablet Take 75 mg by mouth 2 (two) times daily.    . TURMERIC PO Take 1 tablet by mouth 2 (two) times daily.      No facility-administered medications prior to visit.     ROS Review of Systems    Constitutional: Negative for activity change, appetite change, chills, fatigue and unexpected weight change.  HENT: Positive for congestion, ear pain and postnasal drip. Negative for ear discharge, mouth sores and sinus pressure.   Eyes: Negative for visual disturbance.  Respiratory: Negative for cough and chest tightness.   Gastrointestinal: Negative for abdominal pain and nausea.  Genitourinary: Negative for difficulty urinating, frequency and vaginal pain.  Musculoskeletal: Negative for back pain and gait problem.  Skin: Negative for pallor and rash.  Neurological: Negative for dizziness, tremors, weakness, numbness and headaches.  Psychiatric/Behavioral: Negative for confusion and sleep disturbance.    Objective:  There were no vitals taken for this visit.  BP Readings from Last 3 Encounters:  05/04/16 (!) 152/80  02/02/16 122/80  01/27/16 (!) 125/57    Wt Readings from Last 3 Encounters:  05/04/16 113 lb 6.4 oz (51.4 kg)  02/02/16 112 lb (50.8 kg)  01/24/16 108 lb 0.4 oz (49 kg)    Physical Exam  Constitutional: She appears well-developed. No distress.  HENT:  Head: Normocephalic.  Right Ear: External ear normal.  Left Ear: External ear normal.  Nose: Nose normal.  Mouth/Throat: Oropharynx is clear and moist.  Eyes: Conjunctivae are normal. Pupils are equal, round, and reactive to light. Right eye exhibits no discharge. Left eye exhibits no  discharge.  Neck: Normal range of motion. Neck supple. No JVD present. No tracheal deviation present. No thyromegaly present.  Cardiovascular: Normal rate, regular rhythm and normal heart sounds.   Pulmonary/Chest: No stridor. No respiratory distress. She has no wheezes.  Abdominal: Soft. Bowel sounds are normal. She exhibits no distension and no mass. There is no tenderness. There is no rebound and no guarding.  Musculoskeletal: She exhibits no edema or tenderness.  Lymphadenopathy:    She has no cervical adenopathy.   Neurological: She displays normal reflexes. No cranial nerve deficit. She exhibits normal muscle tone. Coordination normal.  Skin: No rash noted. No erythema.  Psychiatric: She has a normal mood and affect. Her behavior is normal. Judgment and thought content normal.  R ear canal is inflamed, wax, enlarged LN  Lab Results  Component Value Date   WBC 18.0 (H) 01/27/2016   HGB 12.1 01/27/2016   HCT 37.0 01/27/2016   PLT 222 01/27/2016   GLUCOSE 131 (H) 01/27/2016   CHOL 248 (H) 11/07/2015   TRIG 171.0 (H) 11/07/2015   HDL 50.50 11/07/2015   LDLDIRECT 161.0 05/28/2014   LDLCALC 163 (H) 11/07/2015   ALT 14 01/25/2016   AST 22 01/25/2016   NA 139 01/27/2016   K 3.7 01/27/2016   CL 104 01/27/2016   CREATININE 0.66 01/27/2016   BUN 21 (H) 01/27/2016   CO2 27 01/27/2016   TSH 2.394 01/25/2016   INR 1.05 01/26/2016    Dg Chest 2 View  Result Date: 02/02/2016 CLINICAL DATA:  Bronchiectasis and hemoptysis. Hypertension. History of breast cancer. EXAM: CHEST  2 VIEW COMPARISON:  01/27/2016 FINDINGS: Indistinct confluent densities in the left perihilar regions similar to prior CT. The nodule shown on prior CT are not very conspicuous on conventional radiographs, and the bronchiectasis and airway plugging shown on prior exams is also less conspicuous. Mild enlargement of the cardiopericardial silhouette, without edema. No pleural effusion. Mild thoracic spondylosis. Biapical pleuroparenchymal scarring. IMPRESSION: 1. There is some continued indistinct vague density in the left perihilar region, select but shown on the CT scan from 9 days ago common likely representing some residual atypical pneumonia. 2. Biapical pleuroparenchymal scarring. Electronically Signed   By: Van Clines M.D.   On: 02/02/2016 11:13    Assessment & Plan:   There are no diagnoses linked to this encounter. I am having Ms. Gilson maintain her multivitamin with minerals, Vitamin B-12, Cholecalciferol, Pyridoxine HCl  (VITAMIN B-6 PO), TURMERIC PO, Garlic, CRANBERRY FRUIT PO, butalbital-acetaminophen-caffeine, fluticasone, nadolol, calcitonin (salmon), pantoprazole, albuterol, and ranitidine.  No orders of the defined types were placed in this encounter.    Follow-up: No Follow-up on file.  Walker Kehr, MD

## 2016-06-15 ENCOUNTER — Ambulatory Visit
Admission: RE | Admit: 2016-06-15 | Discharge: 2016-06-15 | Disposition: A | Discharge status: Home / Self Care | Payer: Medicare Other | Source: Ambulatory Visit | Attending: Internal Medicine | Admitting: Internal Medicine

## 2016-06-15 DIAGNOSIS — Z1231 Encounter for screening mammogram for malignant neoplasm of breast: Secondary | ICD-10-CM

## 2016-09-09 ENCOUNTER — Ambulatory Visit (INDEPENDENT_AMBULATORY_CARE_PROVIDER_SITE_OTHER): Payer: Medicare Other | Admitting: Nurse Practitioner

## 2016-09-09 ENCOUNTER — Encounter: Payer: Self-pay | Admitting: Nurse Practitioner

## 2016-09-09 VITALS — BP 154/82 | HR 54 | Temp 98.2°F | Ht 63.0 in | Wt 108.0 lb

## 2016-09-09 DIAGNOSIS — L247 Irritant contact dermatitis due to plants, except food: Secondary | ICD-10-CM

## 2016-09-09 MED ORDER — PREDNISONE 10 MG PO TABS: ORAL_TABLET | ORAL | 0 refills | Status: DC

## 2016-09-09 MED ORDER — METHYLPREDNISOLONE ACETATE 40 MG/ML IJ SUSP
40.0000 mg | Freq: Once | INTRAMUSCULAR | Status: AC
  Administered 2016-09-09: 40 mg via INTRAMUSCULAR

## 2016-09-09 NOTE — Progress Notes (Signed)
Subjective:  Patient ID: Lindsay Schultz, female    DOB: September 24, 1950  Age: 66 y.o. MRN: [id number]  CC: Poison Ivy (red spots on right arm,lower back, iching for 5 days. )   [name]  This is a new problem. The current episode started in the past 7 days. The problem has been waxing and waning since onset. The rash is diffuse. The rash is characterized by itchiness, redness and blistering. She was exposed to plant contact. Pertinent negatives include no congestion, cough, facial edema, fatigue, fever, rhinorrhea, shortness of breath or sore throat. Past treatments include anti-itch cream, antihistamine, cold compress, moisturizer and topical steroids. The treatment provided no relief.    Outpatient Medications Prior to Visit  Medication Sig Dispense Refill  . albuterol (PROVENTIL HFA;VENTOLIN HFA) 108 (90 Base) MCG/ACT inhaler Inhale 2 puffs into the lungs every 6 (six) hours as needed for wheezing or shortness of breath. 1 Inhaler 0  . butalbital-acetaminophen-caffeine (FIORICET, ESGIC) 50-325-40 MG tablet Take 1 tablet by mouth every 6 (six) hours as needed for headache. 100 tablet 2  . calcitonin, salmon, (MIACALCIN/FORTICAL) 200 UNIT/ACT nasal spray Place 1 spray into alternate nostrils daily. 3.7 mL 11  . Cholecalciferol 2000 UNITS CAPS Take 5,000 Units by mouth 2 (two) times daily.     Marland Kitchen CRANBERRY FRUIT PO Take 1 each by mouth every evening.     . Cyanocobalamin (VITAMIN B-12) 500 MCG SUBL 1 tab sl qd (Patient taking differently: Take 500 tablets by mouth 2 (two) times daily. ) 100 tablet 11  . fluticasone (FLONASE) 50 MCG/ACT nasal spray Place 1 spray into both nostrils daily as needed. (Patient taking differently: Place 1 spray into both nostrils daily as needed for allergies. ) 16 g 11  . Garlic 1950 MG CAPS Take 1,000 mg by mouth every evening.     . Multiple Vitamins-Minerals (MULTIVITAMIN WITH MINERALS) tablet Take 1 tablet by mouth every evening.     . nadolol (CORGARD) 40 MG tablet  Take 0.5 tablets (20 mg total) by mouth 2 (two) times daily. 60 tablet 11  . Pyridoxine HCl (VITAMIN B-6 PO) Take 100 mcg by mouth 2 (two) times daily.     . ranitidine (ZANTAC) 150 MG tablet Take 75 mg by mouth 2 (two) times daily.    . TURMERIC PO Take 1 tablet by mouth 2 (two) times daily.     . cefdinir (OMNICEF) 300 MG capsule Take 1 capsule (300 mg total) by mouth 2 (two) times daily. (Patient not taking: Reported on 09/09/2016) 20 capsule 0  . neomycin-polymyxin-hydrocortisone (CORTISPORIN) otic solution Place 3 drops into the right ear 4 (four) times daily. (Patient not taking: Reported on 09/09/2016) 10 mL 3  . pantoprazole (PROTONIX) 40 MG tablet Take 1 tablet (40 mg total) by mouth 2 (two) times daily before a meal. (Patient not taking: Reported on 06/08/2016) 60 tablet 3   No facility-administered medications prior to visit.     ROS See HPI  Objective:  BP (!) 154/82   Pulse (!) 54   Temp 98.2 F (36.8 C)   Ht 5\' 3"  (1.6 m)   Wt 108 lb (49 kg)   SpO2 99%   BMI 19.13 kg/m   BP Readings from Last 3 Encounters:  09/09/16 (!) 154/82  06/08/16 (!) 156/82  05/04/16 (!) 152/80    Wt Readings from Last 3 Encounters:  09/09/16 108 lb (49 kg)  06/08/16 111 lb 4 oz (50.5 kg)  05/04/16 113 lb 6.4 oz (  51.4 kg)    Physical Exam  Constitutional: She is oriented to person, place, and time.  Neurological: She is alert and oriented to person, place, and time.  Skin: Skin is warm and dry. Rash noted. Rash is vesicular. There is erythema.  Diffuse vesicular rash except on face.  Vitals reviewed.   Lab Results  Component Value Date   WBC 18.0 (H) 01/27/2016   HGB 12.1 01/27/2016   HCT 37.0 01/27/2016   PLT 222 01/27/2016   GLUCOSE 131 (H) 01/27/2016   CHOL 248 (H) 11/07/2015   TRIG 171.0 (H) 11/07/2015   HDL 50.50 11/07/2015   LDLDIRECT 161.0 05/28/2014   LDLCALC 163 (H) 11/07/2015   ALT 14 01/25/2016   AST 22 01/25/2016   NA 139 01/27/2016   K 3.7 01/27/2016   CL 104  01/27/2016   CREATININE 0.66 01/27/2016   BUN 21 (H) 01/27/2016   CO2 27 01/27/2016   TSH 2.394 01/25/2016   INR 1.05 01/26/2016    Mm Digital Screening Bilateral  Result Date: 06/15/2016 CLINICAL DATA:  Screening. EXAM: DIGITAL SCREENING BILATERAL MAMMOGRAM WITH CAD COMPARISON:  Previous exam(s). ACR Breast Density Category c: The breast tissue is heterogeneously dense, which may obscure small masses. FINDINGS: There are no findings suspicious for malignancy. Images were processed with CAD. IMPRESSION: No mammographic evidence of malignancy. A result letter of this screening mammogram will be mailed directly to the patient. RECOMMENDATION: Screening mammogram in one year. (Code:[id number]) BI-RADS CATEGORY  1: Negative. Electronically Signed   By: Abelardo Diesel M.D.   On: 06/15/2016 13:01    Assessment & Plan:   Celise was seen today for poison ivy.  Diagnoses and all orders for this visit:  Irritant contact dermatitis due to plants, except food -     methylPREDNISolone acetate (DEPO-MEDROL) injection 40 mg; Inject 1 mL (40 mg total) into the muscle once. -     predniSONE (DELTASONE) 10 MG tablet; Take 4tabs once a day x 2days, then 3tabs once a dayx 2days, then 2tabs once a day x3days, then, 1tab once a day x 3days, then stop   I have discontinued Ms. Dueitt's pantoprazole, neomycin-polymyxin-hydrocortisone, and cefdinir. I am also having her start on predniSONE. Additionally, I am having her maintain her multivitamin with minerals, Vitamin B-12, Cholecalciferol, Pyridoxine HCl (VITAMIN B-6 PO), TURMERIC PO, Garlic, CRANBERRY FRUIT PO, butalbital-acetaminophen-caffeine, fluticasone, nadolol, calcitonin (salmon), albuterol, and ranitidine. We administered methylPREDNISolone acetate.  Meds ordered this encounter  Medications  . methylPREDNISolone acetate (DEPO-MEDROL) injection 40 mg  . predniSONE (DELTASONE) 10 MG tablet    Sig: Take 4tabs once a day x 2days, then 3tabs once a dayx 2days,  then 2tabs once a day x3days, then, 1tab once a day x 3days, then stop    Dispense:  23 tablet    Refill:  0    Order Specific Question:   Supervising Provider    Answer:   Cassandria Anger [[id number]]    Follow-up: Return if symptoms worsen or fail to improve.  Wilfred Lacy, NP

## 2016-09-09 NOTE — Patient Instructions (Signed)
Start oral prednisone tomorrow (take with food).  Poison Ivy Dermatitis Poison ivy dermatitis is redness and soreness (inflammation) of the skin. It is caused by a chemical that is found on the leaves of the poison ivy plant. You may also have itching, a rash, and blisters. Symptoms often clear up in 1-2 weeks. You may get this condition by touching a poison ivy plant. You can also get it by touching something that has the chemical on it. This may include animals or objects that have come in contact with the plant. Follow these instructions at home: General instructions   Take or apply over-the-counter and prescription medicines only as told by your doctor.  If you touch poison ivy, wash your skin with soap and cold water right away.  Use hydrocortisone creams or calamine lotion as needed to help with itching.  Take oatmeal baths as needed. Use colloidal oatmeal. You can get this at a pharmacy or grocery store. Follow the instructions on the package.  Do not scratch or rub your skin.  While you have the rash, wash your clothes right after you wear them. Prevention   Know what poison ivy looks like so you can avoid it. This plant has three leaves with flowering branches on a single stem. The leaves are glossy. They have uneven edges that come to a point at the front.  If you have touched poison ivy, wash with soap and water right away. Be sure to wash under your fingernails.  When hiking or camping, wear long pants, a long-sleeved shirt, tall socks, and hiking boots. You can also use a lotion on your skin that helps to prevent contact with the chemical on the plant.  If you think that your clothes or outdoor gear came in contact with poison ivy, rinse them off with a garden hose before you bring them inside your house. Contact a doctor if:  You have open sores in the rash area.  You have more redness, swelling, or pain in the affected area.  You have redness that spreads beyond the  rash area.  You have fluid, blood, or pus coming from the affected area.  You have a fever.  You have a rash over a large area of your body.  You have a rash on your eyes, mouth, or genitals.  Your rash does not get better after a few days. Get help right away if:  Your face swells or your eyes swell shut.  You have trouble breathing.  You have trouble swallowing. This information is not intended to replace advice given to you by your health care provider. Make sure you discuss any questions you have with your health care provider. Document Released: 05/08/2010 Document Revised: 09/11/2015 Document Reviewed: 09/11/2014 Elsevier Interactive Patient Education  2017 [employer].

## 2016-11-15 ENCOUNTER — Ambulatory Visit (INDEPENDENT_AMBULATORY_CARE_PROVIDER_SITE_OTHER)
Admission: RE | Admit: 2016-11-15 | Discharge: 2016-11-15 | Disposition: A | Discharge status: Home / Self Care | Payer: Medicare Other | Source: Ambulatory Visit | Attending: Pulmonary Disease | Admitting: Pulmonary Disease

## 2016-11-15 DIAGNOSIS — J471 Bronchiectasis with (acute) exacerbation: Secondary | ICD-10-CM

## 2016-11-18 ENCOUNTER — Ambulatory Visit: Payer: Medicare Other | Admitting: Pulmonary Disease

## 2016-11-18 ENCOUNTER — Telehealth: Payer: Self-pay | Admitting: Internal Medicine

## 2016-11-18 ENCOUNTER — Telehealth: Payer: Self-pay | Admitting: Pulmonary Disease

## 2016-11-18 MED ORDER — NADOLOL 40 MG PO TABS
20.0000 mg | ORAL_TABLET | Freq: Two times a day (BID) | ORAL | 0 refills | Status: DC
Start: 2016-11-18 — End: 2016-12-13

## 2016-11-18 NOTE — Telephone Encounter (Signed)
Patient is scheduled for 8/27 for CPE w/ Plot.  Patient is requesting script for nadolol to be sent to [facility] [phone].  Patient states she is almost out of this medication.  States fax was sent over on Monday.

## 2016-11-18 NOTE — Telephone Encounter (Signed)
Per office policy sent 30 day script to [facility] until appt...Johny Chess

## 2016-11-18 NOTE — Telephone Encounter (Signed)
Notes recorded by Marshell Garfinkel, MD on 11/17/2016 at 12:25 AM EDT Please let the patient know that CT scan shows improvement in lung opacities. The lung nodules have also improved in size. We will follow-up with a repeat CT without contrast in 1 year -------------------------------------------- Spoke with pt. She is aware of results. Pt had an appointment with PM today but she asked for this to be cancelled as she felt it wasn't needed. Nothing further was needed.

## 2016-12-13 ENCOUNTER — Ambulatory Visit (INDEPENDENT_AMBULATORY_CARE_PROVIDER_SITE_OTHER): Payer: Medicare Other | Admitting: Internal Medicine

## 2016-12-13 ENCOUNTER — Other Ambulatory Visit: Payer: Medicare Other

## 2016-12-13 ENCOUNTER — Other Ambulatory Visit (INDEPENDENT_AMBULATORY_CARE_PROVIDER_SITE_OTHER): Payer: Medicare Other

## 2016-12-13 ENCOUNTER — Encounter: Payer: Self-pay | Admitting: Internal Medicine

## 2016-12-13 VITALS — BP 118/54 | HR 66 | Temp 98.4°F | Resp 20 | Ht 63.0 in | Wt 108.0 lb

## 2016-12-13 DIAGNOSIS — Z23 Encounter for immunization: Secondary | ICD-10-CM

## 2016-12-13 DIAGNOSIS — Z0000 Encounter for general adult medical examination without abnormal findings: Secondary | ICD-10-CM

## 2016-12-13 LAB — URINALYSIS
Bilirubin Urine: NEGATIVE
Hgb urine dipstick: NEGATIVE
KETONES UR: NEGATIVE
Leukocytes, UA: NEGATIVE
Nitrite: NEGATIVE
PH: 7 (ref 5.0–8.0)
SPECIFIC GRAVITY, URINE: 1.01 (ref 1.000–1.030)
TOTAL PROTEIN, URINE-UPE24: NEGATIVE
URINE GLUCOSE: NEGATIVE
UROBILINOGEN UA: 0.2 (ref 0.0–1.0)

## 2016-12-13 LAB — LIPID PANEL
CHOL/HDL RATIO: 5
Cholesterol: 235 mg/dL — ABNORMAL HIGH (ref 0–200)
HDL: 46.7 mg/dL (ref >39.00)
NONHDL: 188.12
TRIGLYCERIDES: 204 mg/dL — AB (ref 0.0–149.0)
VLDL: 40.8 mg/dL — AB (ref 0.0–40.0)

## 2016-12-13 LAB — CBC WITH DIFFERENTIAL/PLATELET
BASOS PCT: 1 % (ref 0.0–3.0)
Basophils Absolute: 0.1 10*3/uL (ref 0.0–0.1)
EOS ABS: 0.1 10*3/uL (ref 0.0–0.7)
EOS PCT: 1.5 % (ref 0.0–5.0)
HEMATOCRIT: 39.6 % (ref 36.0–46.0)
HEMOGLOBIN: 13 g/dL (ref 12.0–15.0)
LYMPHS PCT: 27.4 % (ref 12.0–46.0)
Lymphs Abs: 1.8 10*3/uL (ref 0.7–4.0)
MCHC: 32.9 g/dL (ref 30.0–36.0)
MCV: 92.2 fl (ref 78.0–100.0)
MONOS PCT: 9 % (ref 3.0–12.0)
Monocytes Absolute: 0.6 10*3/uL (ref 0.1–1.0)
NEUTROS ABS: 4 10*3/uL (ref 1.4–7.7)
Neutrophils Relative %: 61.1 % (ref 43.0–77.0)
PLATELETS: 200 10*3/uL (ref 150.0–400.0)
RBC: 4.3 Mil/uL (ref 3.87–5.11)
RDW: 13.3 % (ref 11.5–15.5)
WBC: 6.5 10*3/uL (ref 4.0–10.5)

## 2016-12-13 LAB — HEPATIC FUNCTION PANEL
ALT: 13 U/L (ref 0–35)
AST: 21 U/L (ref 0–37)
Albumin: 4.4 g/dL (ref 3.5–5.2)
Alkaline Phosphatase: 83 U/L (ref 39–117)
BILIRUBIN DIRECT: 0.1 mg/dL (ref 0.0–0.3)
BILIRUBIN TOTAL: 0.3 mg/dL (ref 0.2–1.2)
Total Protein: 7 g/dL (ref 6.0–8.3)

## 2016-12-13 LAB — BASIC METABOLIC PANEL
BUN: 13 mg/dL (ref 6–23)
CHLORIDE: 102 meq/L (ref 96–112)
CO2: 35 mEq/L — ABNORMAL HIGH (ref 19–32)
CREATININE: 0.71 mg/dL (ref 0.40–1.20)
Calcium: 9.7 mg/dL (ref 8.4–10.5)
GFR: 87.63 mL/min (ref >60.00)
Glucose, Bld: 90 mg/dL (ref 70–99)
POTASSIUM: 3.8 meq/L (ref 3.5–5.1)
Sodium: 140 mEq/L (ref 135–145)

## 2016-12-13 LAB — TSH: TSH: 1.42 u[IU]/mL (ref 0.35–4.50)

## 2016-12-13 LAB — LDL CHOLESTEROL, DIRECT: LDL DIRECT: 147 mg/dL

## 2016-12-13 MED ORDER — CALCITONIN (SALMON) 200 UNIT/ACT NA SOLN: 1.0000 | Freq: Every day | NASAL | 11 refills | Status: DC

## 2016-12-13 MED ORDER — NADOLOL 40 MG PO TABS: 20.0000 mg | ORAL_TABLET | Freq: Two times a day (BID) | ORAL | 11 refills | Status: DC

## 2016-12-13 MED ORDER — FLUTICASONE PROPIONATE 50 MCG/ACT NA SUSP: 1.0000 | Freq: Every day | NASAL | 11 refills | Status: DC | PRN

## 2016-12-13 NOTE — Assessment & Plan Note (Addendum)
Here for medicare wellness/physical  Diet: heart healthy  Physical activity: not sedentary  Depression/mood screen: negative  Hearing: intact to whispered voice  Visual acuity: grossly normal, performs annual eye exam  ADLs: capable  Fall risk: low to none  Home safety: good  Cognitive evaluation: intact to orientation, naming, recall and repetition  EOL planning: adv directives, full code/ I agree  I have personally reviewed and have noted  1. The patient's medical, surgical and social history  2. Their use of alcohol, tobacco or illicit drugs  3. Their current medications and supplements  4. The patient's functional ability including ADL's, fall risks, home safety risks and hearing or visual impairment.  5. Diet and physical activities  6. Evidence for depression or mood disorders 7. The roster of all physicians providing medical care to patient - is listed in the Snapshot section of the chart and reviewed today.    Today patient counseled on age appropriate routine health concerns for screening and prevention, each reviewed and up to date or declined. Immunizations reviewed and up to date or declined. Labs ordered and reviewed. Risk factors for depression reviewed and negative. Hearing function and visual acuity are intact. ADLs screened and addressed as needed. Functional ability and level of safety reviewed and appropriate. Education, counseling and referrals performed based on assessed risks today. Patient provided with a copy of personalized plan for preventive services.   cologuard

## 2016-12-13 NOTE — Patient Instructions (Signed)
Continue doing brain stimulating activities (puzzles, reading, adult coloring books, staying active) to keep memory sharp.   Continue to eat heart healthy diet (full of fruits, vegetables, whole grains, lean protein, water--limit salt, fat, and sugar intake) and increase physical activity as tolerated.   Ms. How , Thank you for taking time to come for your Medicare Wellness Visit. I appreciate your ongoing commitment to your health goals. Please review the following plan we discussed and let me know if I can assist you in the future.   These are the goals we discussed: Goals    . Be as healthy as possible          Continue to walk and eat healthy.       This is a list of the screening recommended for you and due dates:  Health Maintenance  Topic Date Due  . HIV Screening  04/19/1966  . Colon Cancer Screening  01/23/2014  . Flu Shot  11/17/2016  . Mammogram  06/15/2018  . Pneumonia vaccines (2 of 2 - PPSV23) 05/29/2019  . Tetanus Vaccine  09/27/2022  . DEXA scan (bone density measurement)  Completed  .  Hepatitis C: One time screening is recommended by Center for Disease Control  (CDC) for  adults born from [birth year] through 1964.   Completed   Influenza Virus Vaccine (Flucelvax) What is this medicine? INFLUENZA VIRUS VACCINE (in floo EN zuh VAHY ruhs vak SEEN) helps to reduce the risk of getting influenza also known as the flu. The vaccine only helps protect you against some strains of the flu. This medicine may be used for other purposes; ask your health care provider or pharmacist if you have questions. COMMON BRAND NAME(S): FLUCELVAX What should I tell my health care provider before I take this medicine? They need to know if you have any of these conditions: -bleeding disorder like hemophilia -fever or infection -Guillain-Barre syndrome or other neurological problems -immune system problems -infection with the human immunodeficiency virus (HIV) or AIDS -low blood platelet  counts -multiple sclerosis -an unusual or allergic reaction to influenza virus vaccine, other medicines, foods, dyes or preservatives -pregnant or trying to get pregnant -breast-feeding How should I use this medicine? This vaccine is for injection into a muscle. It is given by a health care professional. A copy of Vaccine Information Statements will be given before each vaccination. Read this sheet carefully each time. The sheet may change frequently. Talk to your pediatrician regarding the use of this medicine in children. Special care may be needed. Overdosage: If you think you've taken too much of this medicine contact a poison control center or emergency room at once. Overdosage: If you think you have taken too much of this medicine contact a poison control center or emergency room at once. NOTE: This medicine is only for you. Do not share this medicine with others. What if I miss a dose? This does not apply. What may interact with this medicine? -chemotherapy or radiation therapy -medicines that lower your immune system like etanercept, anakinra, infliximab, and adalimumab -medicines that treat or prevent blood clots like warfarin -phenytoin -steroid medicines like prednisone or cortisone -theophylline -vaccines This list may not describe all possible interactions. Give your health care provider a list of all the medicines, herbs, non-prescription drugs, or dietary supplements you use. Also tell them if you smoke, drink alcohol, or use illegal drugs. Some items may interact with your medicine. What should I watch for while using this medicine? Report any side effects  that do not go away within 3 days to your doctor or health care professional. Call your health care provider if any unusual symptoms occur within 6 weeks of receiving this vaccine. You may still catch the flu, but the illness is not usually as bad. You cannot get the flu from the vaccine. The vaccine will not protect  against colds or other illnesses that may cause fever. The vaccine is needed every year. What side effects may I notice from receiving this medicine? Side effects that you should report to your doctor or health care professional as soon as possible: -allergic reactions like skin rash, itching or hives, swelling of the face, lips, or tongue Side effects that usually do not require medical attention (Report these to your doctor or health care professional if they continue or are bothersome.): -fever -headache -muscle aches and pains -pain, tenderness, redness, or swelling at the injection site -tiredness This list may not describe all possible side effects. Call your doctor for medical advice about side effects. You may report side effects to FDA at 1-800-FDA-1088. Where should I keep my medicine? The vaccine will be given by a health care professional in a clinic, pharmacy, doctor's office, or other health care setting. You will not be given vaccine doses to store at home. NOTE: This sheet is a summary. It may not cover all possible information. If you have questions about this medicine, talk to your doctor, pharmacist, or health care provider.  2018 Elsevier/Gold Standard (2011-03-17 14:06:47)

## 2016-12-13 NOTE — Progress Notes (Signed)
Subjective:   Lindsay Schultz is a 66 y.o. female who presents for an Initial Medicare Annual Wellness Visit.  Review of Systems    No ROS.  Medicare Wellness Visit. Additional risk factors are reflected in the social history.     Sleep patterns: feels rested on waking, gets up 1 times nightly to void and sleeps 7-8 hours nightly.    Home Safety/Smoke Alarms: Feels safe in home. Smoke alarms in place.  Living environment; residence and Firearm Safety: 1-story house/ trailer, no firearms. Lives with husband, no needs for DME, good support system Seat Belt Safety/Bike Helmet: Wears seat belt.       Objective:    There were no vitals filed for this visit. There is no height or weight on file to calculate BMI.   Current Medications (verified) Outpatient Encounter Prescriptions as of 12/13/2016  Medication Sig  . albuterol (PROVENTIL HFA;VENTOLIN HFA) 108 (90 Base) MCG/ACT inhaler Inhale 2 puffs into the lungs every 6 (six) hours as needed for wheezing or shortness of breath.  . butalbital-acetaminophen-caffeine (FIORICET, ESGIC) 50-325-40 MG tablet Take 1 tablet by mouth every 6 (six) hours as needed for headache.  . calcitonin, salmon, (MIACALCIN/FORTICAL) 200 UNIT/ACT nasal spray Place 1 spray into alternate nostrils daily.  . Cholecalciferol 2000 UNITS CAPS Take 5,000 Units by mouth 2 (two) times daily.   Marland Kitchen CRANBERRY FRUIT PO Take 1 each by mouth every evening.   . Cyanocobalamin (VITAMIN B-12) 500 MCG SUBL 1 tab sl qd (Patient taking differently: Take 500 tablets by mouth 2 (two) times daily. )  . fluticasone (FLONASE) 50 MCG/ACT nasal spray Place 1 spray into both nostrils daily as needed. (Patient taking differently: Place 1 spray into both nostrils daily as needed for allergies. )  . Garlic 4854 MG CAPS Take 1,000 mg by mouth every evening.   . Multiple Vitamins-Minerals (MULTIVITAMIN WITH MINERALS) tablet Take 1 tablet by mouth every evening.   . nadolol (CORGARD) 40 MG tablet  Take 0.5 tablets (20 mg total) by mouth 2 (two) times daily. Must keep 12/13/16 appt for future refills  . predniSONE (DELTASONE) 10 MG tablet Take 4tabs once a day x 2days, then 3tabs once a dayx 2days, then 2tabs once a day x3days, then, 1tab once a day x 3days, then stop  . Pyridoxine HCl (VITAMIN B-6 PO) Take 100 mcg by mouth 2 (two) times daily.   . ranitidine (ZANTAC) 150 MG tablet Take 75 mg by mouth 2 (two) times daily.  . TURMERIC PO Take 1 tablet by mouth 2 (two) times daily.    No facility-administered encounter medications on file as of 12/13/2016.     Allergies (verified) Meloxicam; Paroxetine; Penicillins; Ciprofloxacin; Clarithromycin; and Zithromax [azithromycin]   History: Past Medical History:  Diagnosis Date  . Allergic rhinitis   . Benign paroxysmal positional vertigo 06/14/2015  . Bronchiectasis ([place])   . GERD (gastroesophageal reflux disease)   . HTN (hypertension)   . Hx of breast cancer   . Osteopenia    Past Surgical History:  Procedure Laterality Date  . ABDOMINAL HYSTERECTOMY    . APPENDECTOMY    . BREAST LUMPECTOMY  2008   Left  . NASAL SINUS SURGERY  1997   Family History  Problem Relation Age of Onset  . Sjogren's syndrome Mother   . Arthritis Father 22       RA  . Emphysema Father   . Heart disease Father   . Hypertension Other   . Hodgkin's lymphoma  Brother    Social History   Occupational History  . Retired     Orthoptist   Social History Main Topics  . Smoking status: Never Smoker  . Smokeless tobacco: Never Used  . Alcohol use No  . Drug use: No  . Sexual activity: Yes    Tobacco Counseling Counseling given: Not Answered   Activities of Daily Living In your present state of health, do you have any difficulty performing the following activities: 01/25/2016  Hearing? N  Vision? N  Difficulty concentrating or making decisions? N  Walking or climbing stairs? N  Dressing or bathing? N  Doing errands, shopping? N  Some  recent data might be hidden    Immunizations and Health Maintenance Immunization History  Administered Date(s) Administered  . Influenza Split 02/15/2012, 02/13/2015  . Influenza Whole 02/02/2008, 01/29/2009, 12/12/2009  . Influenza,inj,Quad PF,6+ Mos 02/23/2013, 02/25/2014, 02/02/2016  . Pneumococcal Conjugate-13 11/07/2015  . Pneumococcal Polysaccharide-23 05/28/2014  . Tdap 09/26/2012  . Zoster 02/15/2012   Health Maintenance Due  Topic Date Due  . HIV Screening  04/19/1966  . COLONOSCOPY  01/23/2014  . INFLUENZA VACCINE  11/17/2016    Patient Care Team: Plotnikov, Evie Lacks, MD as PCP - General Magrinat, Virgie Dad, MD (Hematology and Oncology) Marshell Garfinkel, MD as Consulting Physician (Pulmonary Disease) Lyndal Pulley, DO as Attending Physician (Sports Medicine)  Indicate any recent Medical Services you may have received from other than [facility] providers in the past year (date may be approximate).     Assessment:   This is a routine wellness examination for Mackenzi. Physical assessment deferred to PCP.   Hearing/Vision screen No exam data present  Dietary issues and exercise activities discussed:   Diet (meal preparation, eat out, water intake, caffeinated beverages, dairy products, fruits and vegetables): in general, a "healthy" diet  , well balanced , eats a variety of fruits and vegetables daily, limits salt, fat/cholesterol, sugar, caffeine, drinks 2-3 glasses of water daily.  encouraged patient to increase daily water intake.       Goals    None     Depression Screen No flowsheet data found.  Fall Risk No flowsheet data found.  Cognitive Function:       Ad8 score reviewed for issues:  Issues making decisions: no  Less interest in hobbies / activities: no  Repeats questions, stories (family complaining): no  Trouble using ordinary gadgets (microwave, computer, phone):no  Forgets the month or year: no  Mismanaging finances:  no  Remembering appts: no  Daily problems with thinking and/or memory: no Ad8 score is= 0  Screening Tests Health Maintenance  Topic Date Due  . HIV Screening  04/19/1966  . COLONOSCOPY  01/23/2014  . INFLUENZA VACCINE  11/17/2016  . MAMMOGRAM  06/15/2018  . PNA vac Low Risk Adult (2 of 2 - PPSV23) 05/29/2019  . TETANUS/TDAP  09/27/2022  . DEXA SCAN  Completed  . Hepatitis C Screening  Completed      Plan:    Continue doing brain stimulating activities (puzzles, reading, adult coloring books, staying active) to keep memory sharp.   Continue to eat heart healthy diet (full of fruits, vegetables, whole grains, lean protein, water--limit salt, fat, and sugar intake) and increase physical activity as tolerated.  I have personally reviewed and noted the following in the patient's chart:   . Medical and social history . Use of alcohol, tobacco or illicit drugs  . Current medications and supplements . Functional ability and status .  Nutritional status . Physical activity . Advanced directives . List of other physicians . Vitals . Screenings to include cognitive, depression, and falls . Referrals and appointments  In addition, I have reviewed and discussed with patient certain preventive protocols, quality metrics, and best practice recommendations. A written personalized care plan for preventive services as well as general preventive health recommendations were provided to patient.     Michiel Cowboy, RN   12/13/2016

## 2016-12-13 NOTE — Progress Notes (Signed)
Pre visit review using our clinic review tool, if applicable. No additional management support is needed unless otherwise documented below in the visit note. 

## 2016-12-13 NOTE — Progress Notes (Signed)
Subjective:  Patient ID: Lindsay Schultz, female    DOB: January 29, 1951  Age: 66 y.o. MRN: [id number]  CC: Medicare Wellness   HPI TAYTUM SCHECK presents for a well exam  Outpatient Medications Prior to Visit  Medication Sig Dispense Refill  . albuterol (PROVENTIL HFA;VENTOLIN HFA) 108 (90 Base) MCG/ACT inhaler Inhale 2 puffs into the lungs every 6 (six) hours as needed for wheezing or shortness of breath. 1 Inhaler 0  . butalbital-acetaminophen-caffeine (FIORICET, ESGIC) 50-325-40 MG tablet Take 1 tablet by mouth every 6 (six) hours as needed for headache. 100 tablet 2  . Cholecalciferol 2000 UNITS CAPS Take 5,000 Units by mouth 2 (two) times daily.     Marland Kitchen CRANBERRY FRUIT PO Take 1 each by mouth every evening.     . Cyanocobalamin (VITAMIN B-12) 500 MCG SUBL 1 tab sl qd (Patient taking differently: Take 500 tablets by mouth 2 (two) times daily. ) 100 tablet 11  . Garlic 3419 MG CAPS Take 1,000 mg by mouth every evening.     . Multiple Vitamins-Minerals (MULTIVITAMIN WITH MINERALS) tablet Take 1 tablet by mouth every evening.     . Pyridoxine HCl (VITAMIN B-6 PO) Take 100 mcg by mouth 2 (two) times daily.     . ranitidine (ZANTAC) 150 MG tablet Take 75 mg by mouth 2 (two) times daily.    . TURMERIC PO Take 1 tablet by mouth 2 (two) times daily.     . calcitonin, salmon, (MIACALCIN/FORTICAL) 200 UNIT/ACT nasal spray Place 1 spray into alternate nostrils daily. 3.7 mL 11  . fluticasone (FLONASE) 50 MCG/ACT nasal spray Place 1 spray into both nostrils daily as needed. (Patient taking differently: Place 1 spray into both nostrils daily as needed for allergies. ) 16 g 11  . nadolol (CORGARD) 40 MG tablet Take 0.5 tablets (20 mg total) by mouth 2 (two) times daily. Must keep 12/13/16 appt for future refills 60 tablet 0  . predniSONE (DELTASONE) 10 MG tablet Take 4tabs once a day x 2days, then 3tabs once a dayx 2days, then 2tabs once a day x3days, then, 1tab once a day x 3days, then stop (Patient not taking:  Reported on 12/13/2016) 23 tablet 0   No facility-administered medications prior to visit.     ROS Review of Systems  Constitutional: Negative for activity change, appetite change, chills, fatigue and unexpected weight change.  HENT: Negative for congestion, mouth sores and sinus pressure.   Eyes: Negative for visual disturbance.  Respiratory: Negative for cough and chest tightness.   Gastrointestinal: Negative for abdominal pain and nausea.  Genitourinary: Negative for difficulty urinating, frequency and vaginal pain.  Musculoskeletal: Negative for back pain and gait problem.  Skin: Negative for pallor and rash.  Neurological: Negative for dizziness, tremors, weakness, numbness and headaches.  Psychiatric/Behavioral: Negative for confusion and sleep disturbance.    Objective:  BP (!) 118/54   Pulse 66   Temp 98.4 F (36.9 C)   Resp 20   Ht 5\' 3"  (1.6 m)   Wt 108 lb (49 kg)   SpO2 99%   BMI 19.13 kg/m   BP Readings from Last 3 Encounters:  12/13/16 (!) 118/54  09/09/16 (!) 154/82  06/08/16 (!) 156/82    Wt Readings from Last 3 Encounters:  12/13/16 108 lb (49 kg)  09/09/16 108 lb (49 kg)  06/08/16 111 lb 4 oz (50.5 kg)    Physical Exam  Constitutional: She appears well-developed. No distress.  HENT:  Head: Normocephalic.  Right  Ear: External ear normal.  Left Ear: External ear normal.  Nose: Nose normal.  Mouth/Throat: Oropharynx is clear and moist.  Eyes: Pupils are equal, round, and reactive to light. Conjunctivae are normal. Right eye exhibits no discharge. Left eye exhibits no discharge.  Neck: Normal range of motion. Neck supple. No JVD present. No tracheal deviation present. No thyromegaly present.  Cardiovascular: Normal rate, regular rhythm and normal heart sounds.   Pulmonary/Chest: No stridor. No respiratory distress. She has no wheezes.  Abdominal: Soft. Bowel sounds are normal. She exhibits no distension and no mass. There is no tenderness. There is  no rebound and no guarding.  Musculoskeletal: She exhibits no edema or tenderness.  Lymphadenopathy:    She has no cervical adenopathy.  Neurological: She displays normal reflexes. No cranial nerve deficit. She exhibits normal muscle tone. Coordination normal.  Skin: No rash noted. No erythema.  Psychiatric: She has a normal mood and affect. Her behavior is normal. Judgment and thought content normal.    Lab Results  Component Value Date   WBC 18.0 (H) 01/27/2016   HGB 12.1 01/27/2016   HCT 37.0 01/27/2016   PLT 222 01/27/2016   GLUCOSE 131 (H) 01/27/2016   CHOL 248 (H) 11/07/2015   TRIG 171.0 (H) 11/07/2015   HDL 50.50 11/07/2015   LDLDIRECT 161.0 05/28/2014   LDLCALC 163 (H) 11/07/2015   ALT 14 01/25/2016   AST 22 01/25/2016   NA 139 01/27/2016   K 3.7 01/27/2016   CL 104 01/27/2016   CREATININE 0.66 01/27/2016   BUN 21 (H) 01/27/2016   CO2 27 01/27/2016   TSH 2.394 01/25/2016   INR 1.05 01/26/2016    Ct Chest Wo Contrast  Result Date: 11/15/2016 CLINICAL DATA:  Bronchiectasis. EXAM: CT CHEST WITHOUT CONTRAST TECHNIQUE: Multidetector CT imaging of the chest was performed following the standard protocol without IV contrast. COMPARISON:  01/24/2016 FINDINGS: Cardiovascular: Heart size upper normal. No pericardial effusion. Coronary artery calcification is noted. Atherosclerotic calcification is noted in the wall of the thoracic aorta. Mediastinum/Nodes: No mediastinal lymphadenopathy. No evidence for gross hilar lymphadenopathy although assessment is limited by the lack of intravenous contrast on today's study. Calcified lymph nodes identified in the left hilum. The esophagus has normal imaging features. There is no axillary lymphadenopathy. Left thyroid lobe surgically absent. Lungs/Pleura: Stable appearance biapical pleural-parenchymal scarring. Prominent bronchial wall thickening with bronchiectasis again noted bilaterally. Prominent airway impaction in the right upper lobe is  similar to prior. A similar bronchiectasis and airway impaction noted in the left upper lobe although the associated ground-glass airspace disease seen on the prior study is decreased substantially in the interval. 6 mm left upper lobe pulmonary nodule identified on the previous study still measures 6 mm in long axis, but volumetric Lee appears decreased, measuring 6 x 3 mm today compared to 6 x 6 mm on the prior study. Patchy left lower lobe airspace disease seen on the prior study has resolved. Posterior left lower lobe calcified granuloma is unchanged. Upper Abdomen: Unremarkable. Musculoskeletal: Bone windows reveal no worrisome lytic or sclerotic osseous lesions. IMPRESSION: 1. The patchy ground-glass airspace disease seen as a left lung predominant process on the prior study clearly improved and in some areas completely resolved. There is persistent bilateral bronchiectasis with bronchial wall thickening and prominent airway impaction, most notably in the posterior right upper lobe and anterior left upper lobe. Prominent right middle lobe disease persists. 2. Nodular areas of opacity on the prior study show variable interval response with  some appearing stable, others having decrease , and still others having resolved completely. Repeat CT chest without contrast in 6-12 months may be helpful to ensure stability. 3.  Aortic Atherosclerois (ICD10-170.0) Electronically Signed   By: Misty Stanley M.D.   On: 11/15/2016 14:27    Assessment & Plan:   Rosalba was seen today for medicare wellness.  Diagnoses and all orders for this visit:  Encounter for Medicare annual wellness exam  Need for influenza vaccination -     Flu vaccine HIGH DOSE PF (Fluzone High dose)  Well adult exam -     Basic metabolic panel; Future -     CBC with Differential/Platelet; Future -     Hepatic function panel; Future -     Lipid panel; Future -     TSH; Future -     Urinalysis; Future  Other orders -     nadolol  (CORGARD) 40 MG tablet; Take 0.5 tablets (20 mg total) by mouth 2 (two) times daily. -     calcitonin, salmon, (MIACALCIN/FORTICAL) 200 UNIT/ACT nasal spray; Place 1 spray into alternate nostrils daily. -     fluticasone (FLONASE) 50 MCG/ACT nasal spray; Place 1 spray into both nostrils daily as needed.   I have discontinued Ms. Buskey's predniSONE. I have also changed her nadolol. Additionally, I am having her maintain her multivitamin with minerals, Vitamin B-12, Cholecalciferol, Pyridoxine HCl (VITAMIN B-6 PO), TURMERIC PO, Garlic, CRANBERRY FRUIT PO, butalbital-acetaminophen-caffeine, albuterol, ranitidine, calcitonin (salmon), and fluticasone.  Meds ordered this encounter  Medications  . nadolol (CORGARD) 40 MG tablet    Sig: Take 0.5 tablets (20 mg total) by mouth 2 (two) times daily.    Dispense:  30 tablet    Refill:  11  . calcitonin, salmon, (MIACALCIN/FORTICAL) 200 UNIT/ACT nasal spray    Sig: Place 1 spray into alternate nostrils daily.    Dispense:  3.7 mL    Refill:  11  . fluticasone (FLONASE) 50 MCG/ACT nasal spray    Sig: Place 1 spray into both nostrils daily as needed.    Dispense:  16 g    Refill:  11     Follow-up: No Follow-up on file.  Walker Kehr, MD

## 2016-12-21 ENCOUNTER — Encounter: Payer: Self-pay | Admitting: Internal Medicine

## 2016-12-21 LAB — COLOGUARD

## 2016-12-27 LAB — FECAL OCCULT BLOOD, GUAIAC: Fecal Occult Blood: NEGATIVE

## 2017-01-24 ENCOUNTER — Encounter: Payer: Self-pay | Admitting: Family Medicine

## 2017-01-24 ENCOUNTER — Ambulatory Visit (INDEPENDENT_AMBULATORY_CARE_PROVIDER_SITE_OTHER): Payer: Medicare Other | Admitting: Family Medicine

## 2017-01-24 VITALS — BP 138/87 | HR 58 | Temp 98.5°F | Ht 63.0 in | Wt 107.0 lb

## 2017-01-24 DIAGNOSIS — L309 Dermatitis, unspecified: Secondary | ICD-10-CM

## 2017-01-24 MED ORDER — TRIAMCINOLONE ACETONIDE 0.1 % EX CREA: 1.0000 "application " | TOPICAL_CREAM | Freq: Two times a day (BID) | CUTANEOUS | 0 refills | Status: DC

## 2017-01-24 NOTE — Patient Instructions (Signed)
WE NOW OFFER   Lindsay Schultz[facility] FAST TRACK!!!  SAME DAY Appointments for ACUTE CARE  Such as: Sprains, Injuries, cuts, abrasions, rashes, muscle pain, joint pain, back pain Colds, flu, sore throats, headache, allergies, cough, fever  Ear pain, sinus and eye infections Abdominal pain, nausea, vomiting, diarrhea, upset stomach Animal/insect bites  3 Easy Ways to Schedule: Walk-In Scheduling Call in scheduling Mychart Sign-up: [email or web address]         

## 2017-01-24 NOTE — Progress Notes (Signed)
   Subjective:    Patient ID: Lindsay Schultz, female    DOB: 1950-08-05, 66 y.o.   MRN: [id number]  HPI Here for one week of an itchy rash on the neck. It gets red and irritated, but there are no blisters. No recent medication changes or soaps or detergents. She has used Benadryl cream, cortisone cream, and Claritin with no response. She does note that she had to euthanize her beloved cat 6 days ago and this has been difficult.    Review of Systems  Constitutional: Negative.   Respiratory: Negative.   Cardiovascular: Negative.   Skin: Positive for rash.       Objective:   Physical Exam  Constitutional: She appears well-developed and well-nourished.  Cardiovascular: Normal rate, regular rhythm, normal heart sounds and intact distal pulses.   Pulmonary/Chest: Effort normal and breath sounds normal.  Skin:  The anterior neck is slightly pink and macular, not scaly           Assessment & Plan:  Dermatitis, possibly eczema. Emotional stress may have been a contributing factor. Try Triamcinolone 0.1% cream BID. Stay out of the sun. Recheck prn.  Alysia Penna, MD

## 2017-02-17 ENCOUNTER — Telehealth: Payer: Self-pay | Admitting: Internal Medicine

## 2017-02-17 NOTE — Telephone Encounter (Signed)
Patient states she has not heard anything back on the results of her cologuard.  Patient states Cologuard received on 12/22/2016 to process.

## 2017-02-18 ENCOUNTER — Encounter: Payer: Self-pay | Admitting: Internal Medicine

## 2017-02-18 NOTE — Telephone Encounter (Signed)
Pt notified results negative

## 2017-05-17 ENCOUNTER — Telehealth: Payer: Self-pay | Admitting: Pulmonary Disease

## 2017-05-17 MED ORDER — PREDNISONE 10 MG PO TABS: ORAL_TABLET | ORAL | 0 refills | Status: DC

## 2017-05-17 MED ORDER — HYDROCOD POLST-CPM POLST ER 10-8 MG/5ML PO SUER: 5.0000 mL | Freq: Two times a day (BID) | ORAL | 0 refills | Status: DC | PRN

## 2017-05-17 MED ORDER — DOXYCYCLINE HYCLATE 100 MG PO TABS: 100.0000 mg | ORAL_TABLET | Freq: Two times a day (BID) | ORAL | 0 refills | Status: DC

## 2017-05-17 NOTE — Telephone Encounter (Signed)
Called and spoke to pt who stated yesterday, 05/16/17 in the afternoon started coughing and when she coughed, she coughed up a blood clot. After she got the blood clot up, when she coughed again it was bright red blood. Pt also has some chest tightness going on.  Pt stated this has happened before which previously she ended up in the ICU.   Dr. Vaughan Browner, please advise on recommendations for pt.  Thanks!

## 2017-05-17 NOTE — Telephone Encounter (Signed)
Call in z pack, pred taper starting at 40 mg. Reduce dose by 10 mg every 3 days.  Tussionex cough syrup. Hold flutter valve use till hemoptysis resolves. Keep appointment with SG on 31st with chest x ray. If hemoptysis worsens then she will need to go to emergency room.  Marshell Garfinkel MD [facility] Pager [phone] If no answer or after 3pm call: [phone] 05/17/2017, 9:28 AM

## 2017-05-17 NOTE — Telephone Encounter (Signed)
Called pt back letting her know we were changing the abx from zpak to doxycycline due to pt having an allergy to zithromax.  Also sending in a pred taper to her preferred pharmacy.  Have printed a tussionex script to be signed by Dr. Vaughan Browner once he arrives to clinic for PM clinic.  Will have ready for pt to come and pick up by 2pm today.  appt was scheduled with SG 05/19/17 at 11:45 for her to also have a chest xray done due to the hemoptysis episodes.  Nothing further needed at this current time.

## 2017-05-17 NOTE — Telephone Encounter (Signed)
  Allergies  Allergen Reactions  . Meloxicam Nausea And Vomiting    Upset stomach  . Paroxetine Other (See Comments)    Passed out  . Penicillins Other (See Comments)    passed out after a shot at 67 y.o  Has patient had a PCN reaction causing immediate rash, facial/tongue/throat swelling, SOB or lightheadedness with hypotension: no Has patient had a PCN reaction causing severe rash involving mucus membranes or skin necrosis:  no Has patient had a PCN reaction that required hospitalization: no Has patient had a PCN reaction occurring within the last 10 years: no If all of the above answers are "NO", then may proceed with Cephalosporin use.   . Ciprofloxacin Rash     rash along the vein w/IV Cipro  . Clarithromycin Rash  . Zithromax [Azithromycin] Rash   Please advise an alternative abx to the zpak you want [place] to send in since she is allergic to it.  Also, how much tussionex do you want [place] to prescribe for the pt.  Thanks!

## 2017-05-17 NOTE — Telephone Encounter (Signed)
Give doxy 100 mg bid for 7 days Tussionex 5 mg every 12 hrs as needed for cough. Give the smallest bottle available.

## 2017-05-19 ENCOUNTER — Encounter: Payer: Self-pay | Admitting: Acute Care

## 2017-05-19 ENCOUNTER — Ambulatory Visit (INDEPENDENT_AMBULATORY_CARE_PROVIDER_SITE_OTHER)
Admission: RE | Admit: 2017-05-19 | Discharge: 2017-05-19 | Disposition: A | Discharge status: Home / Self Care | Payer: Medicare Other | Source: Ambulatory Visit | Attending: Acute Care | Admitting: Acute Care

## 2017-05-19 ENCOUNTER — Ambulatory Visit (INDEPENDENT_AMBULATORY_CARE_PROVIDER_SITE_OTHER): Payer: Medicare Other | Admitting: Acute Care

## 2017-05-19 ENCOUNTER — Telehealth: Payer: Self-pay | Admitting: Acute Care

## 2017-05-19 ENCOUNTER — Other Ambulatory Visit (INDEPENDENT_AMBULATORY_CARE_PROVIDER_SITE_OTHER): Payer: Medicare Other

## 2017-05-19 VITALS — BP 154/86 | HR 60 | Temp 98.1°F | Ht 63.0 in | Wt 109.6 lb

## 2017-05-19 DIAGNOSIS — R042 Hemoptysis: Secondary | ICD-10-CM

## 2017-05-19 DIAGNOSIS — J471 Bronchiectasis with (acute) exacerbation: Secondary | ICD-10-CM

## 2017-05-19 LAB — CBC WITH DIFFERENTIAL/PLATELET
BASOS PCT: 0.5 % (ref 0.0–3.0)
Basophils Absolute: 0.1 10*3/uL (ref 0.0–0.1)
EOS ABS: 0.1 10*3/uL (ref 0.0–0.7)
EOS PCT: 0.7 % (ref 0.0–5.0)
HCT: 37.7 % (ref 36.0–46.0)
Hemoglobin: 12.7 g/dL (ref 12.0–15.0)
LYMPHS ABS: 2.7 10*3/uL (ref 0.7–4.0)
Lymphocytes Relative: 26.5 % (ref 12.0–46.0)
MCHC: 33.7 g/dL (ref 30.0–36.0)
MCV: 90.6 fl (ref 78.0–100.0)
MONO ABS: 0.8 10*3/uL (ref 0.1–1.0)
Monocytes Relative: 7.3 % (ref 3.0–12.0)
NEUTROS ABS: 6.7 10*3/uL (ref 1.4–7.7)
NEUTROS PCT: 65 % (ref 43.0–77.0)
PLATELETS: 224 10*3/uL (ref 150.0–400.0)
RBC: 4.16 Mil/uL (ref 3.87–5.11)
RDW: 13.2 % (ref 11.5–15.5)
WBC: 10.4 10*3/uL (ref 4.0–10.5)

## 2017-05-19 MED ORDER — DOXYCYCLINE HYCLATE 100 MG PO TABS: 100.0000 mg | ORAL_TABLET | Freq: Two times a day (BID) | ORAL | 0 refills | Status: DC

## 2017-05-19 NOTE — Assessment & Plan Note (Signed)
Episode of hemoptysis that has self resolved with antibiotic, prednisone, and cough suppression Plan We will extend the Doxycycline to 100 mg twice daily 10 days of treatment ( 6 more tablets to be dispensed) CBC today CXR and call with results. Continue prednisone taper until gone. Continue cough suppression with tussionex cough syrup If you have any worsening or increase in bright red blood in your secretions seek emergency care. Follow up with Dr. Vaughan Browner or Judson Roch in 2 weeks. Please contact office for sooner follow up if symptoms do not improve or worsen or seek emergency care

## 2017-05-19 NOTE — Patient Instructions (Signed)
It is good to see you today. We will extend the Doxycycline to 100 mg twice daily 10 days of treatment ( 6 more tablets to be dispensed) CBC today CXR and call with results. Continue prednisone taper until gone. Continue cough suppression with tussionex cough syrup If you have any worsening or increase in bright red blood in your secretions seek emergency care. Follow up with Dr. Vaughan Browner or Judson Roch in 2 weeks. Please contact office for sooner follow up if symptoms do not improve or worsen or seek emergency care

## 2017-05-19 NOTE — Telephone Encounter (Signed)
Please call patient and let her know her chest x-ray was normal, and that her CBC was stable.  Have her follow the plan of care developed in the office today, and follow-up in 2 weeks as we requested

## 2017-05-19 NOTE — Progress Notes (Signed)
History of Present Illness Lindsay Schultz is a 67 y.o. female never smoker, with Bronchiectasis.  She is followed by Dr. Vaughan Browner   05/19/2017 Acute OV: Pt is here for hemoptysis. She coughed up a small dark blood clot, 05/17/2017  with some bright red blood after the fact, which has now cleared. She was started on Doxycycline and prednisone taper 05/17/2017. She was also started on Tussionex at the same time for cough suppression . She states that she is no longer coughing.She states she has not seen any blood in her secretions since 05/18/2017. She denies any fever or chest pain,  orthopnea or further  hemoptysis.She states this has happened in the past  01/2016 and she was treated as an inpatient with IV antibiotics and steroids.  She has had no recurrance since then until this episode.   Test Results: CXR 05/19/2017>> IMPRESSION: Hyperinflation, without acute disease. Central bronchial wall thickening, as on CT. CBC 05/19/2017>> HGB 12.7, PLT  224,000  CBC Latest Ref Rng & Units 05/19/2017 12/13/2016 01/27/2016  WBC 4.0 - 10.5 K/uL 10.4 6.5 18.0(H)  Hemoglobin 12.0 - 15.0 g/dL 12.7 13.0 12.1  Hematocrit 36.0 - 46.0 % 37.7 39.6 37.0  Platelets 150.0 - 400.0 K/uL 224.0 200.0 222    BMP Latest Ref Rng & Units 12/13/2016 01/27/2016 01/26/2016  Glucose 70 - 99 mg/dL 90 131(H) 147(H)  BUN 6 - 23 mg/dL 13 21(H) 14  Creatinine 0.40 - 1.20 mg/dL 0.71 0.66 0.51  Sodium 135 - 145 mEq/L 140 139 139  Potassium 3.5 - 5.1 mEq/L 3.8 3.7 3.7  Chloride 96 - 112 mEq/L 102 104 105  CO2 19 - 32 mEq/L 35(H) 27 28  Calcium 8.4 - 10.5 mg/dL 9.7 9.4 9.7     PFT    Component Value Date/Time   FEV1PRE 1.79 02/10/2015 1214   FEV1POST 1.85 02/10/2015 1214   FVCPRE 2.35 02/10/2015 1214   FVCPOST 2.34 02/10/2015 1214   TLC 3.98 02/10/2015 1214   DLCOUNC 18.55 02/10/2015 1214   PREFEV1FVCRT 76 02/10/2015 1214   PSTFEV1FVCRT 79 02/10/2015 1214    Dg Chest 2 View  Result Date: 05/19/2017 CLINICAL DATA:  Cough  and hemoptysis for 2 days. Hypertension. History of bronchiectasis. EXAM: CHEST  2 VIEW COMPARISON:  11/15/2016 CT.  02/02/2016 plain film. FINDINGS: Mild hyperinflation. Midline trachea. Normal heart size. Right cardiophrenic angle blunting on the frontal radiograph is similar over multiple prior exams and likely due to a prominent epicardial fat pad and right middle lobe volume loss when correlated with the prior CT. No pleural effusion or pneumothorax. Biapical pleural-parenchymal scarring. No lobar consolidation. Bronchial wall thickening is most apparent centrally on the lateral view. IMPRESSION: Hyperinflation, without acute disease. Central bronchial wall thickening, as on CT. Electronically Signed   By: Abigail Miyamoto M.D.   On: 05/19/2017 16:49     Past medical hx Past Medical History:  Diagnosis Date  . Allergic rhinitis   . Benign paroxysmal positional vertigo 06/14/2015  . Bronchiectasis ([place])   . GERD (gastroesophageal reflux disease)   . HTN (hypertension)   . Hx of breast cancer   . Osteopenia      Social History   Tobacco Use  . Smoking status: Never Smoker  . Smokeless tobacco: Never Used  Substance Use Topics  . Alcohol use: No    Alcohol/week: 0.0 oz  . Drug use: No    Ms.Greeley reports that  has never smoked. she has never used smokeless tobacco. She reports  that she does not drink alcohol or use drugs.  Tobacco Cessation: Never smoker  Past surgical hx, Family hx, Social hx all reviewed.  Current Outpatient Medications on File Prior to Visit  Medication Sig  . butalbital-acetaminophen-caffeine (FIORICET, ESGIC) 50-325-40 MG tablet Take 1 tablet by mouth every 6 (six) hours as needed for headache.  . calcitonin, salmon, (MIACALCIN/FORTICAL) 200 UNIT/ACT nasal spray Place 1 spray into alternate nostrils daily.  . chlorpheniramine-HYDROcodone (TUSSIONEX PENNKINETIC ER) 10-8 MG/5ML SUER Take 5 mLs by mouth every 12 (twelve) hours as needed for cough.  .  Cholecalciferol 2000 UNITS CAPS Take 5,000 Units by mouth 2 (two) times daily.   Marland Kitchen CRANBERRY FRUIT PO Take 1 each by mouth every evening.   . Cyanocobalamin (VITAMIN B-12) 500 MCG SUBL 1 tab sl qd (Patient taking differently: Take 500 tablets by mouth 2 (two) times daily. )  . doxycycline (VIBRA-TABS) 100 MG tablet Take 1 tablet (100 mg total) by mouth 2 (two) times daily.  . fluticasone (FLONASE) 50 MCG/ACT nasal spray Place 1 spray into both nostrils daily as needed.  . Garlic 8938 MG CAPS Take 1,000 mg by mouth every evening.   . Multiple Vitamins-Minerals (MULTIVITAMIN WITH MINERALS) tablet Take 1 tablet by mouth every evening.   . nadolol (CORGARD) 40 MG tablet Take 0.5 tablets (20 mg total) by mouth 2 (two) times daily.  . predniSONE (DELTASONE) 10 MG tablet Take 40mg x3days,30mg x3days,20mg x3days,10mg x3days,then stop  . Pyridoxine HCl (VITAMIN B-6 PO) Take 100 mcg by mouth 2 (two) times daily.   . ranitidine (ZANTAC) 150 MG tablet Take 75 mg by mouth 2 (two) times daily.  . TURMERIC PO Take 1 tablet by mouth 2 (two) times daily.   Marland Kitchen albuterol (PROVENTIL HFA;VENTOLIN HFA) 108 (90 Base) MCG/ACT inhaler Inhale 2 puffs into the lungs every 6 (six) hours as needed for wheezing or shortness of breath. (Patient not taking: Reported on 05/19/2017)   No current facility-administered medications on file prior to visit.      Allergies  Allergen Reactions  . Meloxicam Nausea And Vomiting    Upset stomach  . Paroxetine Other (See Comments)    Passed out  . Penicillins Other (See Comments)    passed out after a shot at 67 y.o  Has patient had a PCN reaction causing immediate rash, facial/tongue/throat swelling, SOB or lightheadedness with hypotension: no Has patient had a PCN reaction causing severe rash involving mucus membranes or skin necrosis:  no Has patient had a PCN reaction that required hospitalization: no Has patient had a PCN reaction occurring within the last 10 years: no If all of  the above answers are "NO", then may proceed with Cephalosporin use.   . Ciprofloxacin Rash     rash along the vein w/IV Cipro  . Clarithromycin Rash  . Zithromax [Azithromycin] Rash    Review Of Systems:  Constitutional:   No  weight loss, night sweats,  Fevers, chills, fatigue, or  lassitude.  HEENT:   No headaches,  Difficulty swallowing,  Tooth/dental problems, or  Sore throat,                No sneezing, itching, ear ache, nasal congestion, post nasal drip,   CV:  No chest pain,  Orthopnea, PND, swelling in lower extremities, anasarca, dizziness, palpitations, syncope.   GI  No heartburn, indigestion, abdominal pain, nausea, vomiting, diarrhea, change in bowel habits, loss of appetite, bloody stools.   Resp: No shortness of breath with exertion or at rest.  No  excess mucus, no productive cough,  No non-productive cough,  No coughing up of blood.  + 1 x  change in color of mucus.  No wheezing.  No chest wall deformity  Skin: no rash or lesions.  GU: no dysuria, change in color of urine, no urgency or frequency.  No flank pain, no hematuria   MS:  No joint pain or swelling.  No decreased range of motion.  No back pain.  Psych:  No change in mood or affect. No depression or anxiety.  No memory loss.   Vital Signs BP (!) 154/86 (BP Location: Left Arm, Cuff Size: Normal)   Pulse 60   Temp 98.1 F (36.7 C) (Oral)   Ht 5\' 3"  (1.6 m)   Wt 109 lb 9.6 oz (49.7 kg)   SpO2 98%   BMI 19.41 kg/m    Physical Exam:  General- No distress,  A&Ox3, pleasant ENT: No sinus tenderness, TM clear, pale nasal mucosa, no oral exudate,no post nasal drip, no LAN Cardiac: S1, S2, regular rate and rhythm, no murmur Chest: No wheeze/ rales/ dullness; no accessory muscle use, no nasal flaring, no sternal retractions Abd.: Soft Non-tender, nondistended, bowel sounds positive , thin Ext: No clubbing cyanosis, edema Neuro:  normal strength, moving all extremities x4, alert and oriented x3 Skin:  No rashes, warm and dry Psych: normal mood and behavior   Assessment/Plan  Bronchiectasis with (acute) exacerbation (HCC) Episode of hemoptysis that has self resolved with antibiotic, prednisone, and cough suppression Plan We will extend the Doxycycline to 100 mg twice daily 10 days of treatment ( 6 more tablets to be dispensed) CBC today CXR and call with results. Continue prednisone taper until gone. Continue cough suppression with tussionex cough syrup If you have any worsening or increase in bright red blood in your secretions seek emergency care. Follow up with Dr. Vaughan Browner or Judson Roch in 2 weeks. Please contact office for sooner follow up if symptoms do not improve or worsen or seek emergency care       [name], NP 05/19/2017  5:34 PM

## 2017-05-23 ENCOUNTER — Telehealth: Payer: Self-pay | Admitting: Acute Care

## 2017-05-23 NOTE — Telephone Encounter (Signed)
[facility], Berline Lopes, [place]     05/23/17 2:58 PM  Note    Called and spoke with patient, she is aware of results and verbalized understanding. Nothing further needed. Will close encounter.

## 2017-05-23 NOTE — Telephone Encounter (Signed)
Called and spoke with patient, she is aware of results and verbalized understanding. Nothing further needed. Will close encounter.

## 2017-05-23 NOTE — Telephone Encounter (Signed)
ATC pt, no answer. Left message for pt to call back.  

## 2017-05-31 ENCOUNTER — Other Ambulatory Visit: Payer: Self-pay | Admitting: Internal Medicine

## 2017-05-31 DIAGNOSIS — Z1231 Encounter for screening mammogram for malignant neoplasm of breast: Secondary | ICD-10-CM

## 2017-06-08 ENCOUNTER — Encounter: Payer: Self-pay | Admitting: Acute Care

## 2017-06-08 ENCOUNTER — Ambulatory Visit (INDEPENDENT_AMBULATORY_CARE_PROVIDER_SITE_OTHER): Payer: Medicare Other | Admitting: Acute Care

## 2017-06-08 DIAGNOSIS — J471 Bronchiectasis with (acute) exacerbation: Secondary | ICD-10-CM | POA: Diagnosis not present

## 2017-06-08 NOTE — Assessment & Plan Note (Signed)
Exacerbation with hemoptysis resolved Flutter valve once daily only Not using mucolytics on a regular basis Plan: Mucinex daily with full glass of water for secretion clearance. We will increase flutter valve use to twice every day. Delsym as needed for cough Tussionex as needed for cough at bedtime as needed Don't drive if sleepy. CT Chest without contrast 10/2017. Claritin as needed for post nasal drip or allergies.  Follow up with Dr. Vaughan Browner or Judson Roch NP  in July after CT Chest If any further hemoptysis, make an appointment or seek emergency care. Please contact office for sooner follow up if symptoms do not improve or worsen or seek emergency care

## 2017-06-08 NOTE — Progress Notes (Signed)
History of Present Illness Lindsay Schultz is a 67 y.o. female  female never smoker, with Bronchiectasis.  She is followed by Dr. Vaughan Browner.   06/08/2017 2 week follow [id number] Pt presents for follow up of hemoptysis. She had a single event of hemoptysis 05/17/2017. It was self resolving.She was started on Doxycycline 100 mg BID x 7 days  and prednisone taper 05/17/2017. She was also started on Tussionex at the same time for cough suppression . She was last seen 05/19/2017. Plan after that visit was as follows:  We will extend the Doxycycline to 100 mg twice daily 10 days of treatment ( 6 more tablets to be dispensed) CBC today CXR and call with results. Continue prednisone taper until gone. Continue cough suppression with tussionex cough syrup If you have any worsening or increase in bright red blood in your secretions seek emergency care. Follow up with Dr. Vaughan Browner or Judson Roch in 2 weeks. Please contact office for sooner follow up if symptoms do not improve or worsen or seek emergency care .  Pt. Presents today stating she is continuing to do well. She states she has had no further coughing, and no further hemoptysis.She completed her doxycycline  and pred taper.She states she has had no more blood in her secretions. She states she still has a mild cough, with secretions that are thick.She is using her flutter valve every morning, once daily. She is using Mucinex as needed, not daily. She is not using cough medication on a regular basis.She denies fever, chest pain, orthopnea or hemoptysis..       Test Results:  CXR 05/19/2017>> IMPRESSION: Hyperinflation, without acute disease. Central bronchial wall thickening, as on CT. CBC 05/19/2017>> HGB 12.7, PLT  224,000  CT Chest 11/15/2016>> The patchy ground-glass airspace disease seen as a left lung predominant process on the prior study clearly improved and in some areas completely resolved. There is persistent bilateral bronchiectasis with  bronchial wall thickening and prominent airway impaction, most notably in the posterior right upper lobe and anterior left upper lobe. Prominent right middle lobe disease persists. Nodular areas of opacity on the prior study show variable interval response with some appearing stable, others having decrease and still others having resolved completely. Repeat CT chest without contrast in 6-12 months may be helpful to ensure stability.  CBC Latest Ref Rng & Units 05/19/2017 12/13/2016 01/27/2016  WBC 4.0 - 10.5 K/uL 10.4 6.5 18.0(H)  Hemoglobin 12.0 - 15.0 g/dL 12.7 13.0 12.1  Hematocrit 36.0 - 46.0 % 37.7 39.6 37.0  Platelets 150.0 - 400.0 K/uL 224.0 200.0 222    BMP Latest Ref Rng & Units 12/13/2016 01/27/2016 01/26/2016  Glucose 70 - 99 mg/dL 90 131(H) 147(H)  BUN 6 - 23 mg/dL 13 21(H) 14  Creatinine 0.40 - 1.20 mg/dL 0.71 0.66 0.51  Sodium 135 - 145 mEq/L 140 139 139  Potassium 3.5 - 5.1 mEq/L 3.8 3.7 3.7  Chloride 96 - 112 mEq/L 102 104 105  CO2 19 - 32 mEq/L 35(H) 27 28  Calcium 8.4 - 10.5 mg/dL 9.7 9.4 9.7    BNP No results found for: BNP  ProBNP No results found for: PROBNP  PFT    Component Value Date/Time   FEV1PRE 1.79 02/10/2015 1214   FEV1POST 1.85 02/10/2015 1214   FVCPRE 2.35 02/10/2015 1214   FVCPOST 2.34 02/10/2015 1214   TLC 3.98 02/10/2015 1214   DLCOUNC 18.55 02/10/2015 1214   PREFEV1FVCRT 76 02/10/2015 1214   PSTFEV1FVCRT 79 02/10/2015 1214  Dg Chest 2 View  Result Date: 05/19/2017 CLINICAL DATA:  Cough and hemoptysis for 2 days. Hypertension. History of bronchiectasis. EXAM: CHEST  2 VIEW COMPARISON:  11/15/2016 CT.  02/02/2016 plain film. FINDINGS: Mild hyperinflation. Midline trachea. Normal heart size. Right cardiophrenic angle blunting on the frontal radiograph is similar over multiple prior exams and likely due to a prominent epicardial fat pad and right middle lobe volume loss when correlated with the prior CT. No pleural effusion or  pneumothorax. Biapical pleural-parenchymal scarring. No lobar consolidation. Bronchial wall thickening is most apparent centrally on the lateral view. IMPRESSION: Hyperinflation, without acute disease. Central bronchial wall thickening, as on CT. Electronically Signed   By: Abigail Miyamoto M.D.   On: 05/19/2017 16:49     Past medical hx Past Medical History:  Diagnosis Date  . Allergic rhinitis   . Benign paroxysmal positional vertigo 06/14/2015  . Bronchiectasis ([place])   . GERD (gastroesophageal reflux disease)   . HTN (hypertension)   . Hx of breast cancer   . Osteopenia      Social History   Tobacco Use  . Smoking status: Never Smoker  . Smokeless tobacco: Never Used  Substance Use Topics  . Alcohol use: No    Alcohol/week: 0.0 oz  . Drug use: No    Ms.Isham reports that  has never smoked. she has never used smokeless tobacco. She reports that she does not drink alcohol or use drugs.  Tobacco Cessation: Never smoker   Past surgical hx, Family hx, Social hx all reviewed.  Current Outpatient Medications on File Prior to Visit  Medication Sig  . albuterol (PROVENTIL HFA;VENTOLIN HFA) 108 (90 Base) MCG/ACT inhaler Inhale 2 puffs into the lungs every 6 (six) hours as needed for wheezing or shortness of breath.  . butalbital-acetaminophen-caffeine (FIORICET, ESGIC) 50-325-40 MG tablet Take 1 tablet by mouth every 6 (six) hours as needed for headache.  . calcitonin, salmon, (MIACALCIN/FORTICAL) 200 UNIT/ACT nasal spray Place 1 spray into alternate nostrils daily.  . chlorpheniramine-HYDROcodone (TUSSIONEX PENNKINETIC ER) 10-8 MG/5ML SUER Take 5 mLs by mouth every 12 (twelve) hours as needed for cough.  . Cholecalciferol 2000 UNITS CAPS Take 5,000 Units by mouth 2 (two) times daily.   Marland Kitchen CRANBERRY FRUIT PO Take 1 each by mouth every evening.   . Cyanocobalamin (VITAMIN B-12) 500 MCG SUBL 1 tab sl qd (Patient taking differently: Take 500 tablets by mouth 2 (two) times daily. )  .  doxycycline (VIBRA-TABS) 100 MG tablet Take 1 tablet (100 mg total) by mouth 2 (two) times daily.  . fluticasone (FLONASE) 50 MCG/ACT nasal spray Place 1 spray into both nostrils daily as needed.  . Garlic 0272 MG CAPS Take 1,000 mg by mouth every evening.   . Multiple Vitamins-Minerals (MULTIVITAMIN WITH MINERALS) tablet Take 1 tablet by mouth every evening.   . nadolol (CORGARD) 40 MG tablet Take 0.5 tablets (20 mg total) by mouth 2 (two) times daily.  . Pyridoxine HCl (VITAMIN B-6 PO) Take 100 mcg by mouth 2 (two) times daily.   . ranitidine (ZANTAC) 150 MG tablet Take 75 mg by mouth 2 (two) times daily.  . TURMERIC PO Take 1 tablet by mouth 2 (two) times daily.    No current facility-administered medications on file prior to visit.      Allergies  Allergen Reactions  . Meloxicam Nausea And Vomiting    Upset stomach  . Paroxetine Other (See Comments)    Passed out  . Penicillins Other (See Comments)  passed out after a shot at 67 y.o  Has patient had a PCN reaction causing immediate rash, facial/tongue/throat swelling, SOB or lightheadedness with hypotension: no Has patient had a PCN reaction causing severe rash involving mucus membranes or skin necrosis:  no Has patient had a PCN reaction that required hospitalization: no Has patient had a PCN reaction occurring within the last 10 years: no If all of the above answers are "NO", then may proceed with Cephalosporin use.   . Ciprofloxacin Rash     rash along the vein w/IV Cipro  . Clarithromycin Rash  . Zithromax [Azithromycin] Rash    Review Of Systems:  Constitutional:   No  weight loss, night sweats,  Fevers, chills, fatigue, or  lassitude.  HEENT:   No headaches,  Difficulty swallowing,  Tooth/dental problems, or  Sore throat,                No sneezing, itching, ear ache, nasal congestion,+  post nasal drip,   CV:  No chest pain,  Orthopnea, PND, swelling in lower extremities, anasarca, dizziness, palpitations,  syncope.   GI  No heartburn, indigestion, abdominal pain, nausea, vomiting, diarrhea, change in bowel habits, loss of appetite, bloody stools.   Resp: No shortness of breath with exertion or at rest.  + excess mucus, + productive cough,  No non-productive cough,  No coughing up of blood.  No change in color of mucus.  No wheezing.  No chest wall deformity  Skin: no rash or lesions.  GU: no dysuria, change in color of urine, no urgency or frequency.  No flank pain, no hematuria   MS:  No joint pain or swelling.  No decreased range of motion.  No back pain.  Psych:  No change in mood or affect. No depression or anxiety.  No memory loss.   Vital Signs BP 112/68 (BP Location: Left Arm, Cuff Size: Normal)   Pulse 64   Ht 5\' 3"  (1.6 m)   Wt 107 lb 3.2 oz (48.6 kg)   SpO2 98%   BMI 18.99 kg/m    Physical Exam:  General- No distress,  A&Ox3,pleasant ENT: No sinus tenderness, TM clear, pale nasal mucosa, no oral exudate,no post nasal drip, no LAN Cardiac: S1, S2, regular rate and rhythm, no murmur Chest: No wheeze/ rales/ dullness; no accessory muscle use, no nasal flaring, no sternal retractions Abd.: Soft Non-tender, non-distended, BS+, non-obese Ext: No clubbing cyanosis, edema Neuro:  normal strength Skin: No rashes, warm and dry Psych: normal mood and behavior   Assessment/Plan  Bronchiectasis with (acute) exacerbation (HCC) Exacerbation with hemoptysis resolved Flutter valve once daily only Not using mucolytics on a regular basis Plan: Mucinex daily with full glass of water for secretion clearance. We will increase flutter valve use to twice every day. Delsym as needed for cough Tussionex as needed for cough at bedtime as needed Don't drive if sleepy. CT Chest without contrast 10/2017. Claritin as needed for post nasal drip or allergies.  Follow up with Dr. Vaughan Browner or Judson Roch NP  in July after CT Chest If any further hemoptysis, make an appointment or seek emergency  care. Please contact office for sooner follow up if symptoms do not improve or worsen or seek emergency care        [name], NP 06/08/2017  3:31 PM

## 2017-06-08 NOTE — Patient Instructions (Addendum)
It is nice to see you. Mucinex daily with full glass of water for secretion clearance. We will increase flutter valve use to twice every day. Delsym as needed for cough Tussionex as needed for cough at bedtime as needed Don't drive if sleepy. CT Chest without contrast 10/2017. Claritin as needed for post nasal drip or allergies.  Follow up with Dr. Vaughan Browner or Judson Roch NP  in July after CT Chest If any further hemoptysis, make an appointment or seek emergency care. Please contact office for sooner follow up if symptoms do not improve or worsen or seek emergency care

## 2017-06-21 ENCOUNTER — Ambulatory Visit
Admission: RE | Admit: 2017-06-21 | Discharge: 2017-06-21 | Disposition: A | Discharge status: Home / Self Care | Payer: Medicare Other | Source: Ambulatory Visit | Attending: Internal Medicine | Admitting: Internal Medicine

## 2017-06-21 DIAGNOSIS — Z1231 Encounter for screening mammogram for malignant neoplasm of breast: Secondary | ICD-10-CM

## 2017-06-21 HISTORY — DX: Malignant neoplasm of unspecified site of unspecified female breast: C50.919

## 2017-06-21 HISTORY — DX: Personal history of irradiation: Z92.3

## 2017-08-01 ENCOUNTER — Ambulatory Visit: Payer: Self-pay | Admitting: *Deleted

## 2017-08-01 ENCOUNTER — Ambulatory Visit (INDEPENDENT_AMBULATORY_CARE_PROVIDER_SITE_OTHER): Payer: Medicare Other | Admitting: Family Medicine

## 2017-08-01 ENCOUNTER — Encounter: Payer: Self-pay | Admitting: Family Medicine

## 2017-08-01 VITALS — BP 124/82 | HR 64 | Temp 97.8°F | Ht 63.0 in | Wt 107.6 lb

## 2017-08-01 DIAGNOSIS — K219 Gastro-esophageal reflux disease without esophagitis: Secondary | ICD-10-CM

## 2017-08-01 MED ORDER — PANTOPRAZOLE SODIUM 40 MG PO TBEC: 40.0000 mg | DELAYED_RELEASE_TABLET | Freq: Two times a day (BID) | ORAL | 0 refills | Status: DC

## 2017-08-01 NOTE — Progress Notes (Signed)
   Subjective:    Patient ID: Lindsay Schultz, female    DOB: [birth year], 67 y.o.   MRN: [id number]  HPI Here for 3 weeks of epigastric burning. She has known GERD and at one time she was taking a PPI twice a day along with Zantac. Things improved and she got off the PPI. Now her symptoms are back despite being very careful about her diet. The head of her bed is elevated, etc. No SOB.    Review of Systems  Constitutional: Negative.   Respiratory: Negative.   Cardiovascular: Negative.   Gastrointestinal: Positive for abdominal pain. Negative for abdominal distention, anal bleeding, blood in stool, constipation, diarrhea, nausea, rectal pain and vomiting.       Objective:   Physical Exam  Constitutional: She appears well-developed and well-nourished.  Cardiovascular: Normal rate, regular rhythm, normal heart sounds and intact distal pulses.  Pulmonary/Chest: Effort normal and breath sounds normal. No respiratory distress. She has no wheezes. She has no rales.  Abdominal: Soft. Bowel sounds are normal. She exhibits no distension and no mass. There is no rebound and no guarding.  Mildly tender in the epigastrium           Assessment & Plan:  GERD. She will start back on Pantoprazole 40 mg bid before breakfast and supper,and add Ranitidine 150 mg qhs. Follow up prn.  Alysia Penna, MD

## 2017-08-01 NOTE — Telephone Encounter (Signed)
Pt reports epigastric area "burning type pain and tenderness"  X 3 weeks, was intermittent, now constant since last night, 8/10 with new onset nausea, no emesis. Pain does not radiate. Epigastric area "tender to touch." H/O reflux, takes Zantac BID, no missed doses. Reports loss of appetite,states is staying hydrated. Also with H/O Bronchiectasis. Denies fever, SOB, diaphoresis. Pt's PCP Dr. Alain Marion who does not have openings today; secured appt with L. Murray,NP which pt declined. Requesting to see Dr. Sarajane Jews. NT spoke to [place] for approval; appt made with Dr. Sarajane Jews for today.  Care advise given per protocol. Reason for Disposition . [1] Patient claims chest pain is same as previously diagnosed "heartburn" AND [2] describes burning in chest AND [3] accompanying sour taste in mouth  Answer Assessment - Initial Assessment Questions 1. LOCATION: "Where does it hurt?"       Epigastric area 2. RADIATION: "Does the pain go anywhere else?" (e.g., into neck, jaw, arms, back)     no 3. ONSET: "When did the chest pain begin?" (Minutes, hours or days)       3 weeks ago 4. PATTERN "Does the pain come and go, or has it been constant since it started?"  "Does it get worse with exertion?"      Intermittent in past, now constant 5. DURATION: "How long does it last" (e.g., seconds, minutes, hours)     Intermittent, constant this AM 6. SEVERITY: "How bad is the pain?"  (e.g., Scale 1-10; mild, moderate, or severe)    - MILD (1-3): doesn't interfere with normal activities     - MODERATE (4-7): interferes with normal activities or awakens from sleep    - SEVERE (8-10): excruciating pain, unable to do any normal activities       8/10 7. CARDIAC RISK FACTORS: "Do you have any history of heart problems or risk factors for heart disease?" (e.g., prior heart attack, angina; high blood pressure, diabetes, being overweight, high cholesterol, smoking, or strong family history of heart disease)     no 8. PULMONARY RISK  FACTORS: "Do you have any history of lung disease?"  (e.g., blood clots in lung, asthma, emphysema, birth control pills)     Bronchiectasis. 9. CAUSE: "What do you think is causing the chest pain?"     Heartburn 10. OTHER SYMPTOMS: "Do you have any other symptoms?" (e.g., dizziness, nausea, vomiting, sweating, fever, difficulty breathing, cough)       Nausea , loss of appetite, epigastric area tender to touch, pain does not radiate.  Protocols used: CHEST PAIN-A-AH

## 2017-08-09 ENCOUNTER — Other Ambulatory Visit (INDEPENDENT_AMBULATORY_CARE_PROVIDER_SITE_OTHER): Payer: Medicare Other

## 2017-08-09 ENCOUNTER — Ambulatory Visit (INDEPENDENT_AMBULATORY_CARE_PROVIDER_SITE_OTHER): Payer: Medicare Other | Admitting: Internal Medicine

## 2017-08-09 ENCOUNTER — Encounter: Payer: Self-pay | Admitting: Internal Medicine

## 2017-08-09 DIAGNOSIS — R1084 Generalized abdominal pain: Secondary | ICD-10-CM

## 2017-08-09 DIAGNOSIS — R109 Unspecified abdominal pain: Secondary | ICD-10-CM | POA: Insufficient documentation

## 2017-08-09 DIAGNOSIS — K219 Gastro-esophageal reflux disease without esophagitis: Secondary | ICD-10-CM

## 2017-08-09 LAB — CBC WITH DIFFERENTIAL/PLATELET
Basophils Absolute: 0.1 K/uL (ref 0.0–0.1)
Basophils Relative: 1 % (ref 0.0–3.0)
Eosinophils Absolute: 0.2 K/uL (ref 0.0–0.7)
Eosinophils Relative: 3.1 % (ref 0.0–5.0)
HCT: 39.1 % (ref 36.0–46.0)
Hemoglobin: 13.3 g/dL (ref 12.0–15.0)
Lymphocytes Relative: 23.5 % (ref 12.0–46.0)
Lymphs Abs: 1.6 K/uL (ref 0.7–4.0)
MCHC: 33.9 g/dL (ref 30.0–36.0)
MCV: 89.8 fl (ref 78.0–100.0)
Monocytes Absolute: 0.6 K/uL (ref 0.1–1.0)
Monocytes Relative: 9 % (ref 3.0–12.0)
Neutro Abs: 4.3 K/uL (ref 1.4–7.7)
Neutrophils Relative %: 63.4 % (ref 43.0–77.0)
Platelets: 193 K/uL (ref 150.0–400.0)
RBC: 4.35 Mil/uL (ref 3.87–5.11)
RDW: 13.1 % (ref 11.5–15.5)
WBC: 6.7 K/uL (ref 4.0–10.5)

## 2017-08-09 LAB — HEPATIC FUNCTION PANEL
ALT: 11 U/L (ref 0–35)
AST: 17 U/L (ref 0–37)
Albumin: 4.1 g/dL (ref 3.5–5.2)
Alkaline Phosphatase: 86 U/L (ref 39–117)
Bilirubin, Direct: 0.1 mg/dL (ref 0.0–0.3)
Total Bilirubin: 0.4 mg/dL (ref 0.2–1.2)
Total Protein: 6.7 g/dL (ref 6.0–8.3)

## 2017-08-09 LAB — BASIC METABOLIC PANEL
BUN: 15 mg/dL (ref 6–23)
CALCIUM: 9.5 mg/dL (ref 8.4–10.5)
CO2: 33 meq/L — AB (ref 19–32)
Chloride: 104 mEq/L (ref 96–112)
Creatinine, Ser: 0.68 mg/dL (ref 0.40–1.20)
GFR: 91.92 mL/min (ref >60.00)
Glucose, Bld: 86 mg/dL (ref 70–99)
Potassium: 3.9 mEq/L (ref 3.5–5.1)
SODIUM: 142 meq/L (ref 135–145)

## 2017-08-09 LAB — URINALYSIS
Bilirubin Urine: NEGATIVE
Hgb urine dipstick: NEGATIVE
KETONES UR: NEGATIVE
LEUKOCYTES UA: NEGATIVE
Nitrite: NEGATIVE
PH: 6.5 (ref 5.0–8.0)
SPECIFIC GRAVITY, URINE: 1.02 (ref 1.000–1.030)
Total Protein, Urine: NEGATIVE
Urine Glucose: NEGATIVE
Urobilinogen, UA: 0.2 (ref 0.0–1.0)

## 2017-08-09 LAB — SEDIMENTATION RATE: Sed Rate: 8 mm/hr (ref 0–30)

## 2017-08-09 NOTE — Assessment & Plan Note (Signed)
Protonix.  ?

## 2017-08-09 NOTE — Patient Instructions (Signed)
Go to ER if worse 

## 2017-08-09 NOTE — Assessment & Plan Note (Addendum)
epigastric - r/o GS and other  Abd [place] Labs Go to ER if worse Pt declined meds for nausea, pain

## 2017-08-09 NOTE — Progress Notes (Signed)
Subjective:  Patient ID: Lindsay Schultz, female    DOB: 1950-04-21  Age: 67 y.o. MRN: [id number]  CC: No chief complaint on file.   HPI Lindsay Schultz for GERD and L CP. She was nauseated and bloated, had diarrhea once, nauseated. CP started 2 weeks ago and it moved to the abdomen. Mom had gallstones   Outpatient Medications Prior to Visit  Medication Sig Dispense Refill  . butalbital-acetaminophen-caffeine (FIORICET, ESGIC) 50-325-40 MG tablet Take 1 tablet by mouth every 6 (six) hours as needed for headache. 100 tablet 2  . calcitonin, salmon, (MIACALCIN/FORTICAL) 200 UNIT/ACT nasal spray Place 1 spray into alternate nostrils daily. 3.7 mL 11  . Cholecalciferol 2000 UNITS CAPS Take 5,000 Units by mouth 2 (two) times daily.     Marland Kitchen CRANBERRY FRUIT PO Take 1 each by mouth every evening.     . Cyanocobalamin (VITAMIN B-12) 500 MCG SUBL 1 tab sl qd (Patient taking differently: Take 500 tablets by mouth 2 (two) times daily. ) 100 tablet 11  . fluticasone (FLONASE) 50 MCG/ACT nasal spray Place 1 spray into both nostrils daily as needed. 16 g 11  . Garlic 2778 MG CAPS Take 1,000 mg by mouth every evening.     . Multiple Vitamins-Minerals (MULTIVITAMIN WITH MINERALS) tablet Take 1 tablet by mouth every evening.     . nadolol (CORGARD) 40 MG tablet Take 0.5 tablets (20 mg total) by mouth 2 (two) times daily. 30 tablet 11  . pantoprazole (PROTONIX) 40 MG tablet Take 1 tablet (40 mg total) by mouth 2 (two) times daily before a meal. 60 tablet 0  . Pyridoxine HCl (VITAMIN B-6 PO) Take 100 mcg by mouth 2 (two) times daily.     . ranitidine (ZANTAC) 150 MG tablet Take 75 mg by mouth 2 (two) times daily.    . TURMERIC PO Take 1 tablet by mouth 2 (two) times daily.      No facility-administered medications prior to visit.     ROS Review of Systems  Constitutional: Negative for activity change, appetite change, chills, fatigue and unexpected weight change.  HENT: Negative for congestion, mouth  sores and sinus pressure.   Eyes: Negative for visual disturbance.  Respiratory: Negative for cough and chest tightness.   Gastrointestinal: Positive for abdominal pain, diarrhea and nausea. Negative for vomiting.  Genitourinary: Negative for difficulty urinating, frequency and vaginal pain.  Musculoskeletal: Negative for back pain and gait problem.  Skin: Negative for pallor and rash.  Neurological: Negative for dizziness, tremors, weakness, numbness and headaches.  Psychiatric/Behavioral: Negative for confusion, sleep disturbance and suicidal ideas.    Objective:  BP 132/80 (BP Location: Left Arm, Patient Position: Sitting, Cuff Size: Normal)   Pulse (!) 52   Temp 97.9 F (36.6 C) (Oral)   Ht 5\' 3"  (1.6 m)   Wt 106 lb (48.1 kg)   SpO2 99%   BMI 18.78 kg/m   BP Readings from Last 3 Encounters:  08/09/17 132/80  08/01/17 124/82  06/08/17 112/68    Wt Readings from Last 3 Encounters:  08/09/17 106 lb (48.1 kg)  08/01/17 107 lb 9.6 oz (48.8 kg)  06/08/17 107 lb 3.2 oz (48.6 kg)    Physical Exam  Constitutional: She appears well-developed. No distress.  HENT:  Head: Normocephalic.  Right Ear: External ear normal.  Left Ear: External ear normal.  Nose: Nose normal.  Mouth/Throat: Oropharynx is clear and moist.  Eyes: Pupils are equal, round, and reactive to light. Conjunctivae are  normal. Right eye exhibits no discharge. Left eye exhibits no discharge.  Neck: Normal range of motion. Neck supple. No JVD present. No tracheal deviation present. No thyromegaly present.  Cardiovascular: Normal rate, regular rhythm and normal heart sounds.  Pulmonary/Chest: No stridor. No respiratory distress. She has no wheezes.  Abdominal: Soft. Bowel sounds are normal. She exhibits no distension and no mass. There is tenderness. There is no rebound and no guarding.  Musculoskeletal: She exhibits no edema or tenderness.  Lymphadenopathy:    She has no cervical adenopathy.  Neurological: She  displays normal reflexes. No cranial nerve deficit. She exhibits normal muscle tone. Coordination normal.  Skin: No rash noted. No erythema.  Psychiatric: She has a normal mood and affect. Her behavior is normal. Judgment and thought content normal.   Sensitive abdomen in 1/2 upper abdomen  Lab Results  Component Value Date   WBC 10.4 05/19/2017   HGB 12.7 05/19/2017   HCT 37.7 05/19/2017   PLT 224.0 05/19/2017   GLUCOSE 90 12/13/2016   CHOL 235 (H) 12/13/2016   TRIG 204.0 (H) 12/13/2016   HDL 46.70 12/13/2016   LDLDIRECT 147.0 12/13/2016   LDLCALC 163 (H) 11/07/2015   ALT 13 12/13/2016   AST 21 12/13/2016   NA 140 12/13/2016   K 3.8 12/13/2016   CL 102 12/13/2016   CREATININE 0.71 12/13/2016   BUN 13 12/13/2016   CO2 35 (H) 12/13/2016   TSH 1.42 12/13/2016   INR 1.05 01/26/2016    Mm Screening Breast Tomo Bilateral  Result Date: 06/21/2017 CLINICAL DATA:  Screening. History of left breast cancer in 2008 status post lumpectomy and radiation therapy. EXAM: DIGITAL SCREENING BILATERAL MAMMOGRAM WITH TOMO AND CAD COMPARISON:  Previous exam(s). ACR Breast Density Category c: The breast tissue is heterogeneously dense, which may obscure small masses. FINDINGS: There are stable postsurgical changes within the left breast. There are no findings suspicious for malignancy within either breast. Images were processed with CAD. IMPRESSION: No mammographic evidence of malignancy. A result letter of this screening mammogram will be mailed directly to the patient. RECOMMENDATION: Screening mammogram in one year. (Code:SM-B-01Y) BI-RADS CATEGORY  2: Benign. Electronically Signed   By: Franki Cabot M.D.   On: 06/21/2017 16:45    Assessment & Plan:   There are no diagnoses linked to this encounter. I am having Lindsay Schultz. Lindsay Schultz maintain her multivitamin with minerals, Vitamin B-12, Cholecalciferol, Pyridoxine HCl (VITAMIN B-6 PO), TURMERIC PO, Garlic, CRANBERRY FRUIT PO,  butalbital-acetaminophen-caffeine, ranitidine, nadolol, calcitonin (salmon), fluticasone, and pantoprazole.  No orders of the defined types were placed in this encounter.    Follow-up: No follow-ups on file.  Walker Kehr, MD

## 2017-08-09 NOTE — Addendum Note (Signed)
Addended by: Karren Cobble on: 08/09/2017 01:31 PM   Modules accepted: Orders

## 2017-08-24 ENCOUNTER — Ambulatory Visit
Admission: RE | Admit: 2017-08-24 | Discharge: 2017-08-24 | Disposition: A | Discharge status: Home / Self Care | Payer: Medicare Other | Source: Ambulatory Visit | Attending: Internal Medicine | Admitting: Internal Medicine

## 2017-08-24 DIAGNOSIS — R1084 Generalized abdominal pain: Secondary | ICD-10-CM

## 2017-08-24 DIAGNOSIS — K219 Gastro-esophageal reflux disease without esophagitis: Secondary | ICD-10-CM

## 2017-08-25 IMAGING — CT CT ANGIO CHEST
3 of 7 series · 18 of 36 positions shown · IV contrast (ISOVUE 370)
Comparison: Chest radiograph from the same day, chest CT
11/15/2014.

CLINICAL DATA: Bronchiectasis. Evaluate for pulmonary embolus.
Hemoptysis. History of breast cancer.

EXAM:
CT ANGIOGRAPHY CHEST WITH CONTRAST
TECHNIQUE: Multidetector CT imaging of the chest was performed using the
standard protocol during bolus administration of intravenous
contrast. Multiplanar CT image reconstructions and MIPs were
obtained to evaluate the vascular anatomy.
CONTRAST:  100 cc Isovue 370 intravenously.

[Series 6: thins for pacs · axial · 0.61mm/px · z∈[-263,-16]mm · 15 of 283 slices shown]
[im 18/283  lung]
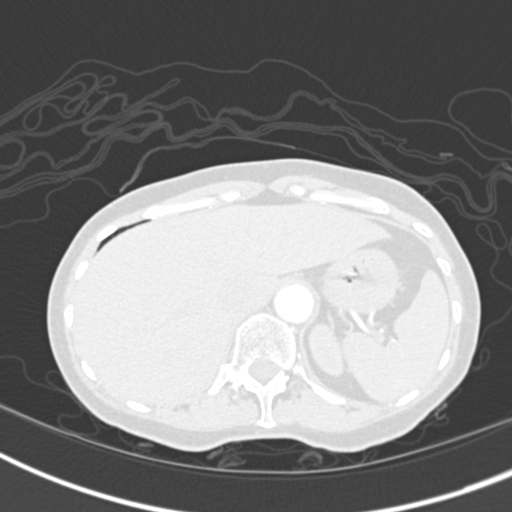
[im 36/283  mediastinal]
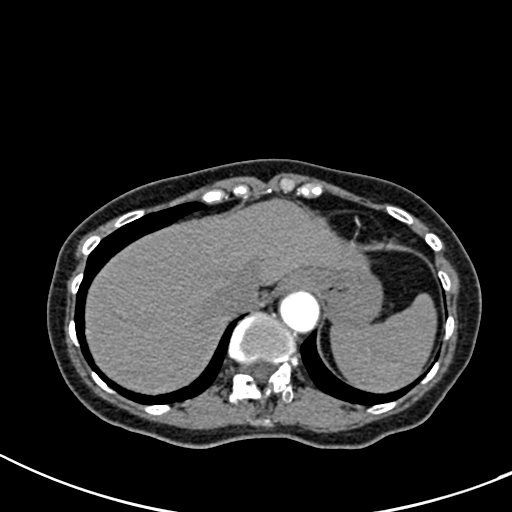
[im 53/283  lung]
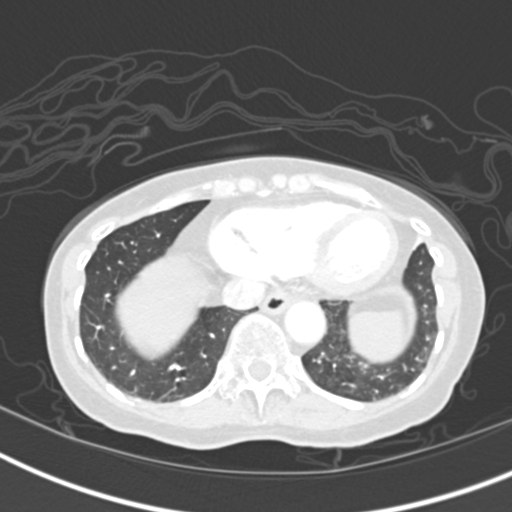
[im 71/283  mediastinal]
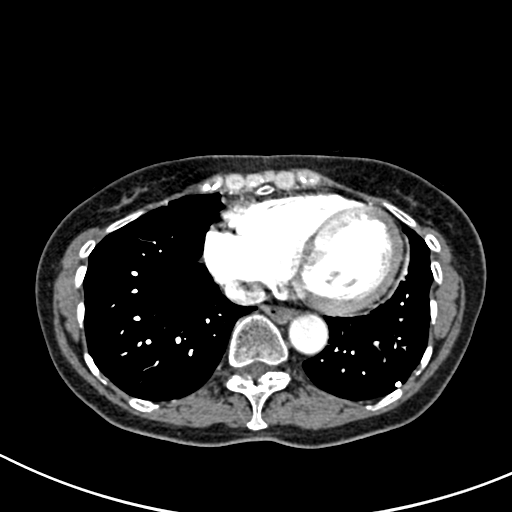
[im 89/283  lung]
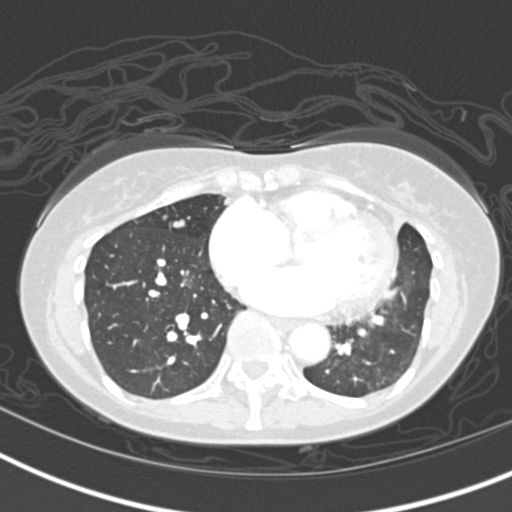
[im 106/283  mediastinal]
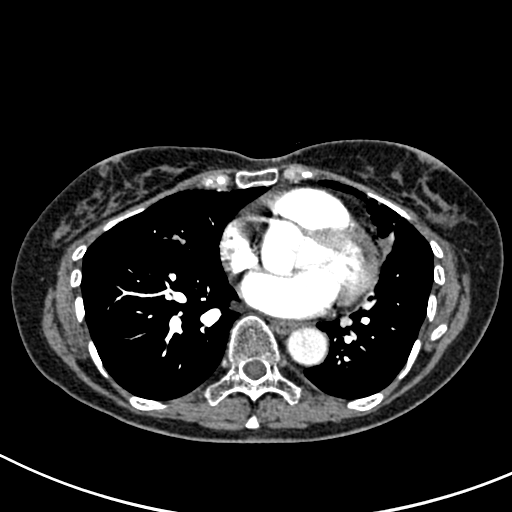
[im 124/283  lung]
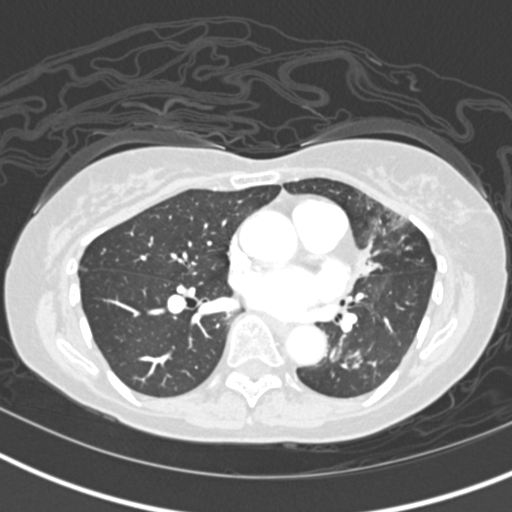
[im 142/283  mediastinal]
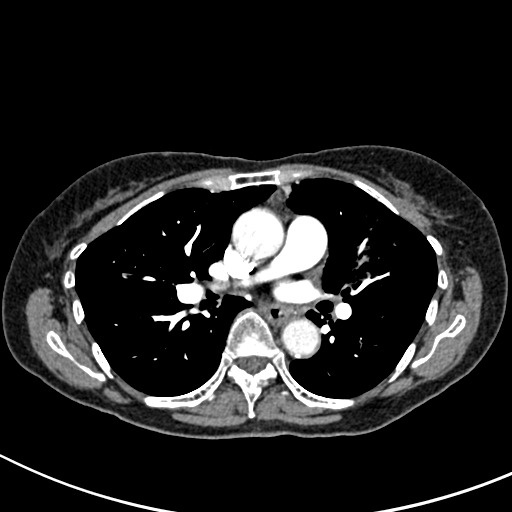
[im 159/283  lung]
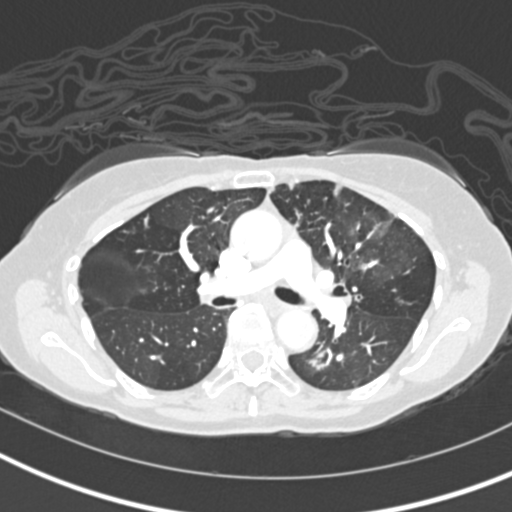
[im 177/283  mediastinal]
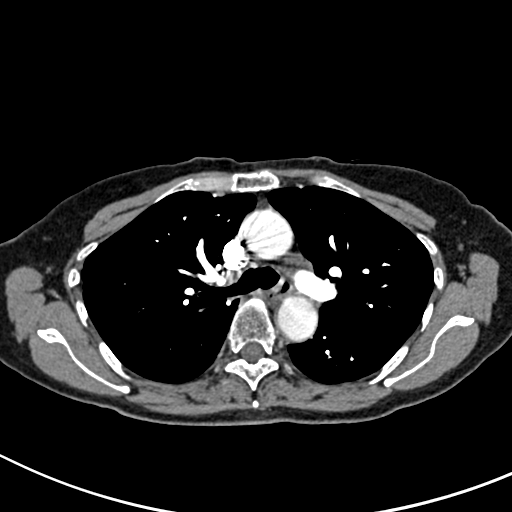
[im 194/283  lung]
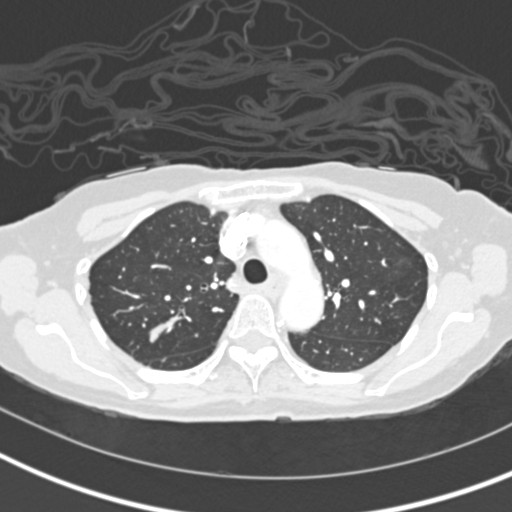
[im 212/283  mediastinal]
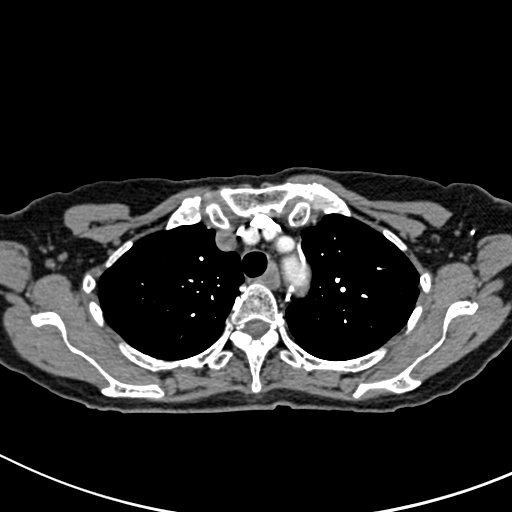
[im 230/283  lung]
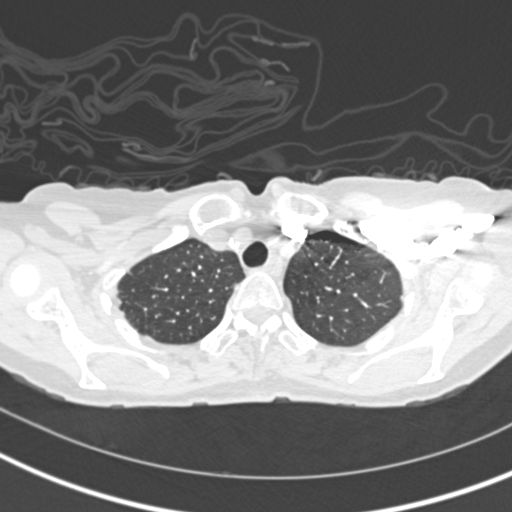
[im 247/283  mediastinal]
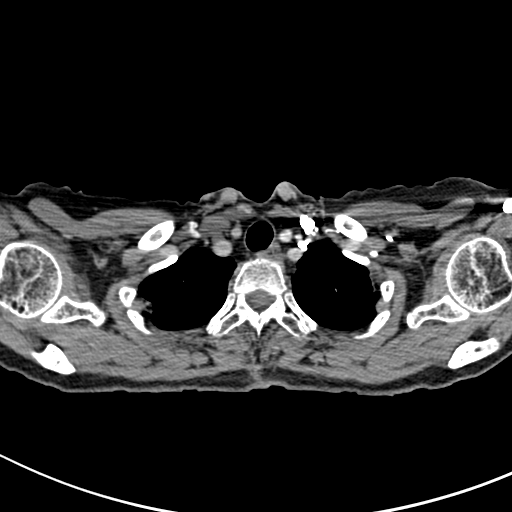
[im 265/283  lung]
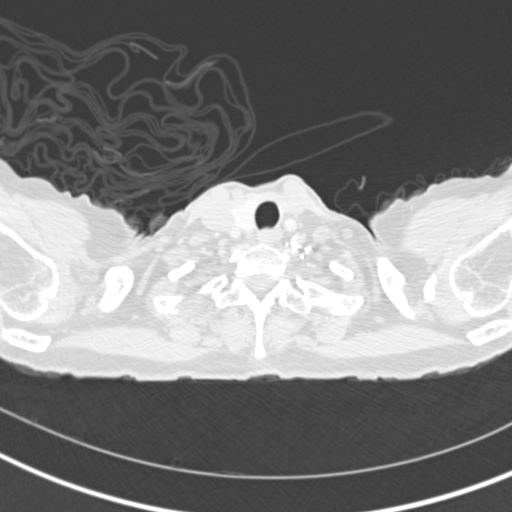

[Series 7: lung windows · axial · 0.61mm/px · z∈[-202,-133]mm · 2 of 91 slices shown]
[im 23/91  mediastinal]
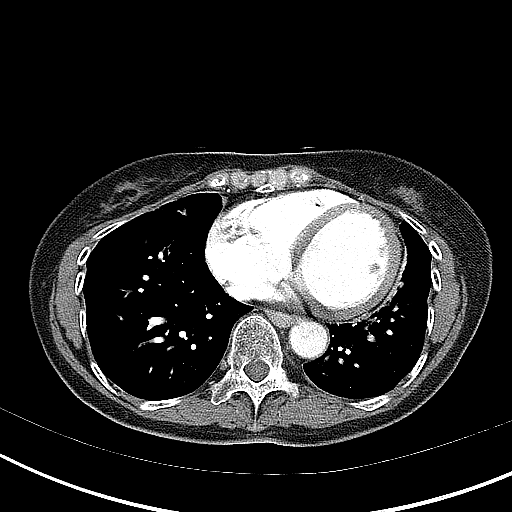
[im 46/91  mediastinal]
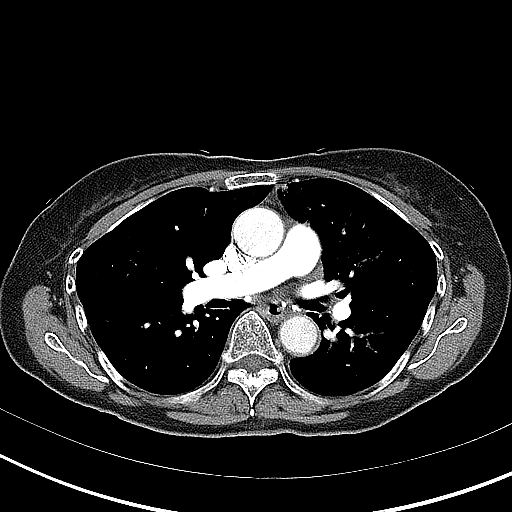

[Series 8: coronal mpr · coronal · 0.57mm/px · 1 of 104 slices shown]
[im 52/104  mediastinal]
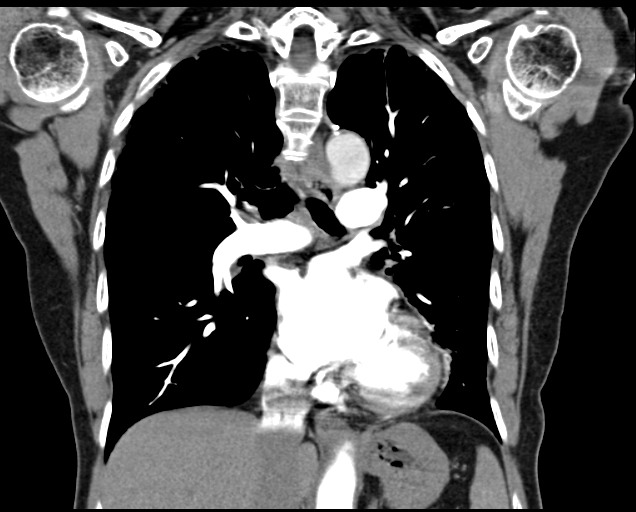

[18 of 36 positions shown; findings below may reference images not displayed]

FINDINGS: Cardiovascular: Satisfactory opacification of the pulmonary arteries
to the segmental level. No evidence of pulmonary embolism. Normal
heart size. No pericardial effusion. Mild calcific coronary artery
disease. Post left thyroidectomy. Mild enlargement of the esophagus.

Mediastinum/Nodes: No enlarged mediastinal, hilar, or axillary lymph
nodes. Calcified left hilar lymph nodes are stable. Right thyroid
gland, and trachea demonstrate no significant findings.

Lungs/Pleura: The previously described 3 discrete soft tissue
nodules in the left upper lobe measuring less than 7 mm are stable.
There are multiple areas of bronchiectasis with soft tissue filling
the dilated distal bronchi, stable. There has been however interval
development of more acute appearing predominantly peribronchial
ground-glass opacities throughout the left lung. Prominent
bronchiectasis and atelectatic changes are seen in the left upper
lobe. 17 mm soft tissue peribronchial thickening is seen adjacent to
un area of cylindrical bronchiectasis, image 47/91, sequence 7.

Upper Abdomen: No acute abnormality.

Musculoskeletal: No chest wall abnormality. No acute or significant
osseous findings.

Review of the MIP images confirms the above findings.
IMPRESSION: No evidence of pulmonary embolus.

Mild calcific coronary artery disease.

Stable appearance of 3 discrete soft tissue nodules in the left
upper lobe, stable from October 2014, and therefore likely benign in
etiology.

Multiple areas of chronic bronchiectasis with soft tissue filling
dilated bronchi, likely representing mucous impaction. Endobronchial
lesions although possible are favored less likely.

New area of bronchiectasis with soft tissue peribronchial thickening
in the left upper lobe, with adjacent ground-glass airspace
consolidation. Other scattered areas of tree-in-bud opacities
throughout the left lung. These findings likely represent acute
infectious/ inflammatory changes such as bronchiolitis. Interstitial
spread of metastatic disease, especially in the left upper lobe, can
however have similar appearance.

## 2017-08-28 IMAGING — DX DG CHEST 2V
2 series · 2 of 2 positions shown · non-contrast
Comparison: Multiple priors. Most recent chest radiograph
01/24/2016.

CLINICAL DATA: Hemoptysis.    Dyspnea and chest pain.

EXAM:
CHEST  2 VIEW

[chest pa]
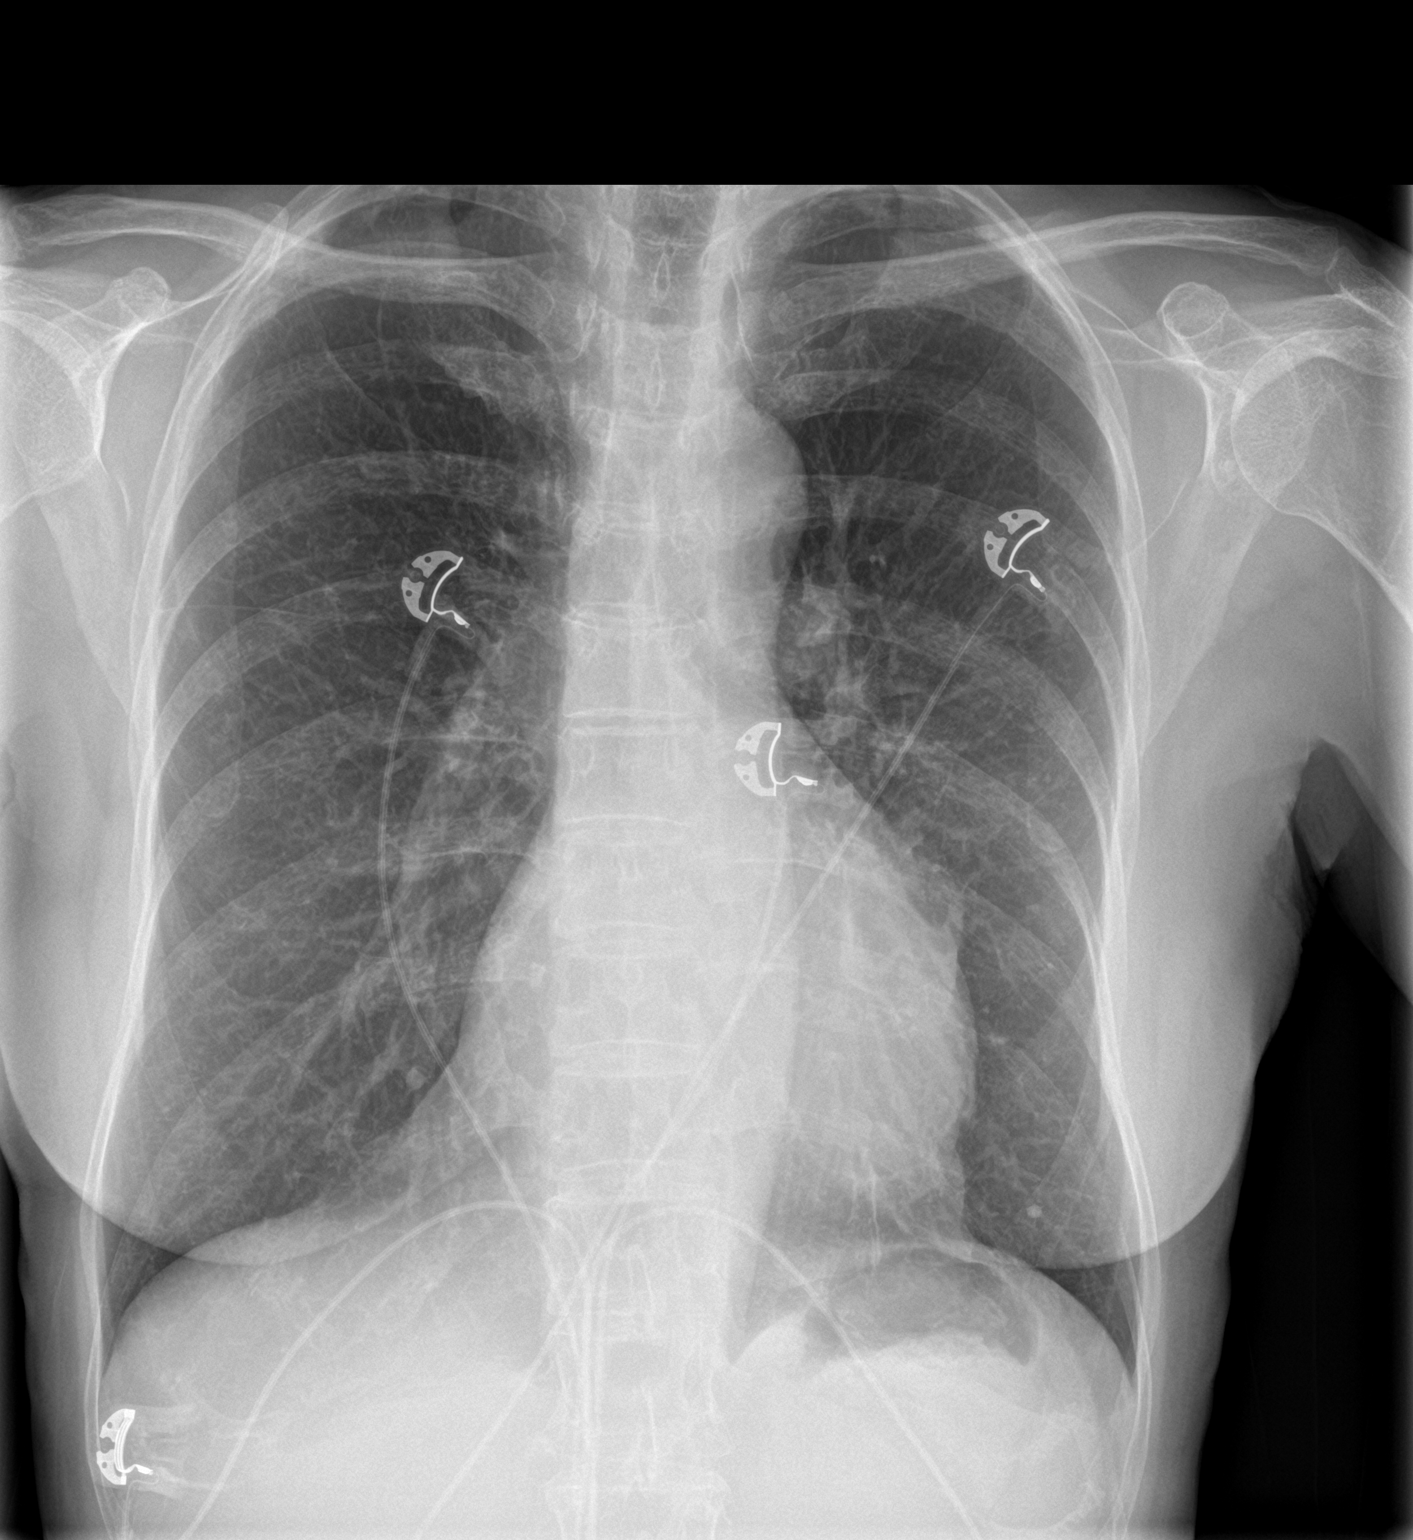

[chest lat]
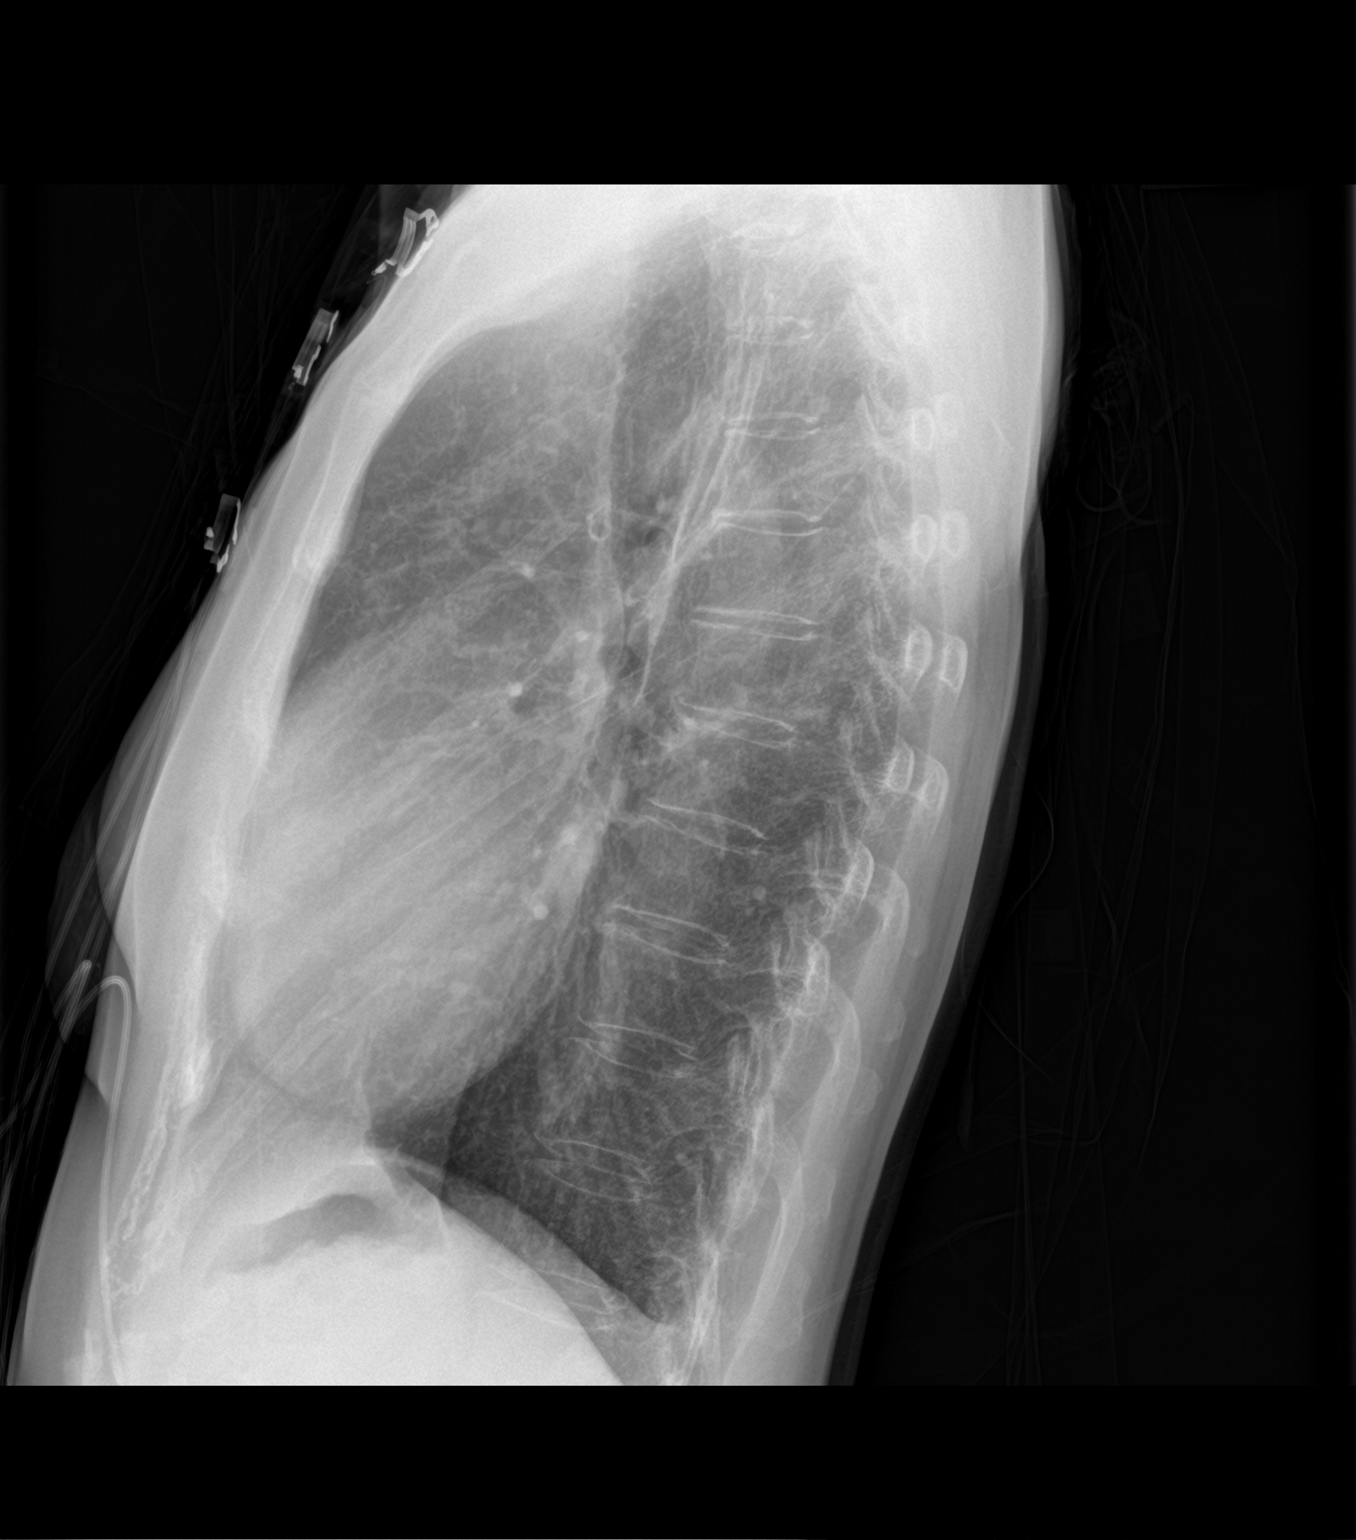

[2 of 2 positions shown; findings below may reference images not displayed]

FINDINGS: The heart size and mediastinal contours are within normal limits.
Both lungs are clear. The visualized skeletal structures are
unremarkable. Moderate hyperinflation.
IMPRESSION: No active cardiopulmonary disease.  Stable exam.

## 2017-09-03 IMAGING — DX DG CHEST 2V
2 series · 2 of 2 positions shown · non-contrast
Comparison: 01/27/2016

CLINICAL DATA: Bronchiectasis and hemoptysis. Hypertension. History
of breast cancer.

EXAM:
CHEST  2 VIEW

[chest pa]
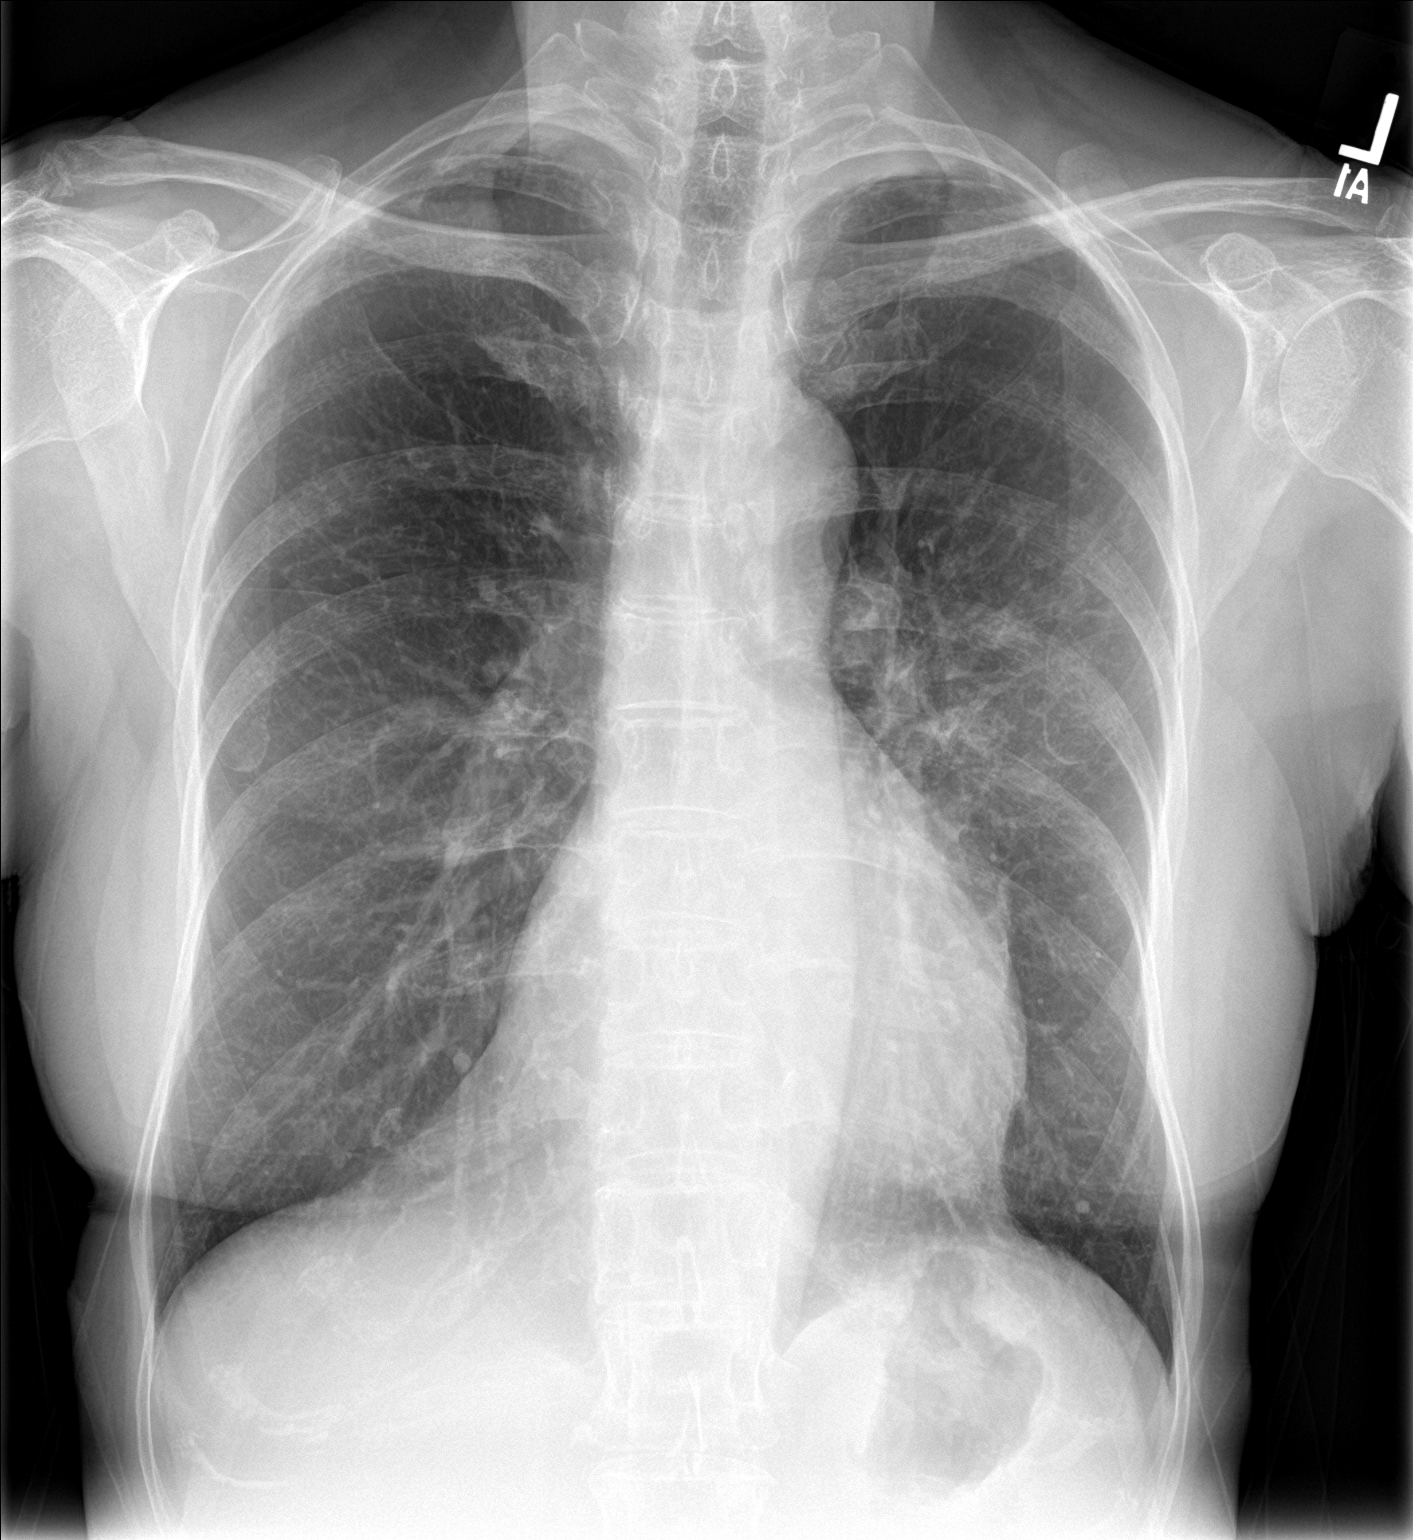

[chest lat]
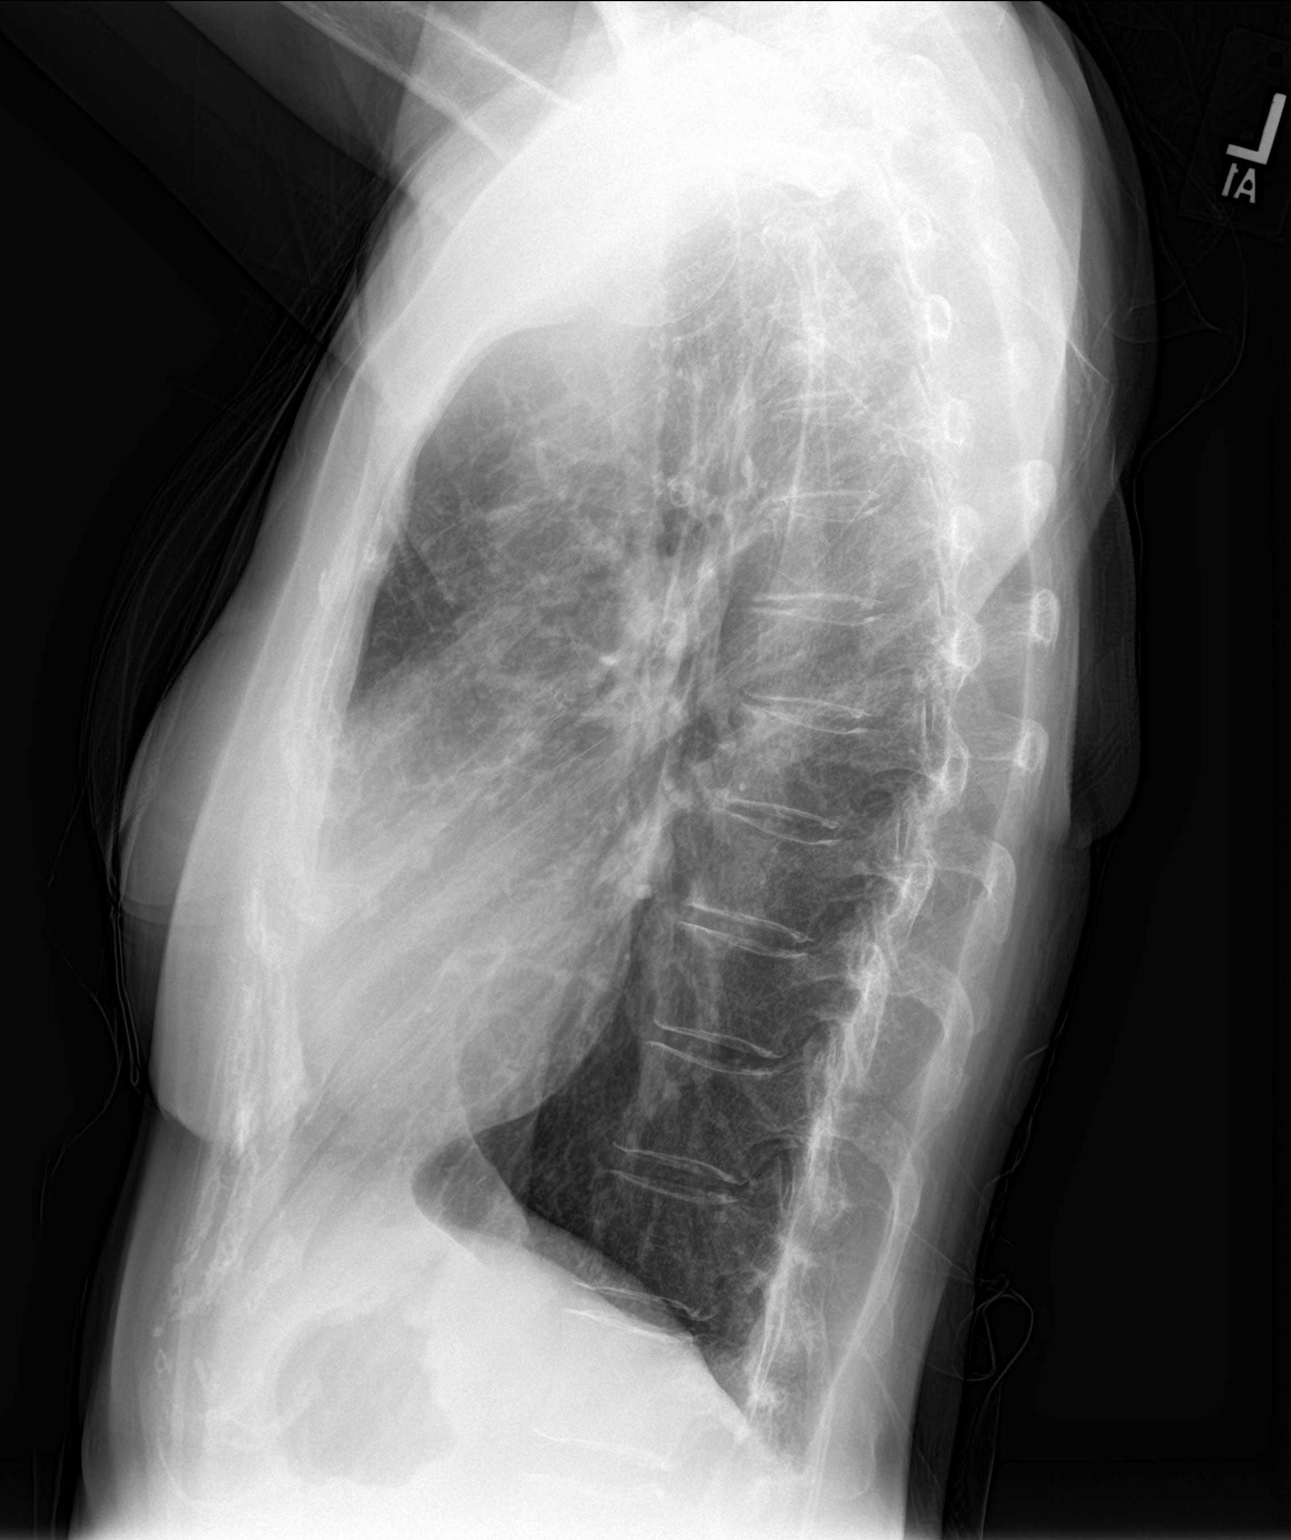

[2 of 2 positions shown; findings below may reference images not displayed]

FINDINGS: Indistinct confluent densities in the left perihilar regions similar
to prior CT. The nodule shown on prior CT are not very conspicuous
on conventional radiographs, and the bronchiectasis and airway
plugging shown on prior exams is also less conspicuous.

Mild enlargement of the cardiopericardial silhouette, without edema.
No pleural effusion. Mild thoracic spondylosis.

Biapical pleuroparenchymal scarring.
IMPRESSION: 1. There is some continued indistinct vague density in the left
perihilar region, select but shown on the CT scan from 9 days ago
common likely representing some residual atypical pneumonia.
2. Biapical pleuroparenchymal scarring.

## 2017-09-22 ENCOUNTER — Encounter: Payer: Self-pay | Admitting: Family

## 2017-09-22 ENCOUNTER — Ambulatory Visit (INDEPENDENT_AMBULATORY_CARE_PROVIDER_SITE_OTHER): Payer: Medicare Other | Admitting: Family

## 2017-09-22 VITALS — BP 124/82 | HR 65 | Temp 98.1°F | Ht 63.0 in | Wt 106.0 lb

## 2017-09-22 DIAGNOSIS — J019 Acute sinusitis, unspecified: Secondary | ICD-10-CM

## 2017-09-22 MED ORDER — DOXYCYCLINE HYCLATE 100 MG PO TABS: 100.0000 mg | ORAL_TABLET | Freq: Two times a day (BID) | ORAL | 0 refills | Status: AC

## 2017-09-22 NOTE — Progress Notes (Signed)
Lindsay Schultz is a 67 y.o. female with the following history as recorded in [employer]:  Patient Active Problem List   Diagnosis Date Noted  . Abdominal pain 08/09/2017  . Ear pain, right 06/08/2016  . Leukocytosis 01/27/2016  . Hemoptysis 01/24/2016  . Bronchiectasis with (acute) exacerbation ([place]) 01/12/2016  . Well adult exam 11/07/2015  . Benign paroxysmal positional vertigo 06/14/2015  . Rash and nonspecific skin eruption 10/30/2014  . Pain in both feet 09/26/2012  . Bilateral hand pain 09/26/2012  . B12 deficiency 08/23/2012  . Paresthesia and pain of extremity 08/22/2012  . LBP radiating to right leg 03/27/2012  . Herpes zoster infection 03/27/2012  . Need for prophylactic vaccination and inoculation against other viral diseases(V04.89) 02/15/2012  . Trigeminal neuralgia pain 01/19/2012  . Asthmatic bronchitis 07/27/2011  . Sinusitis, acute 02/19/2011  . Skin rash 02/19/2011  . NEOPLASM OF UNCERTAIN BEHAVIOR OF SKIN 12/12/2009  . CERUMEN IMPACTION 12/12/2009  . SHOULDER PAIN 01/29/2009  . Headache(784.0) 12/11/2008  . BRONCHITIS, ACUTE 11/06/2008  . Essential hypertension 06/12/2007  . Allergic rhinitis 06/12/2007  . GERD (gastroesophageal reflux disease) 06/12/2007  . Disorder of bone and cartilage 06/12/2007  . BREAST CANCER, HX OF 06/12/2007    Current Outpatient Medications  Medication Sig Dispense Refill  . butalbital-acetaminophen-caffeine (FIORICET, ESGIC) 50-325-40 MG tablet Take 1 tablet by mouth every 6 (six) hours as needed for headache. 100 tablet 2  . calcitonin, salmon, (MIACALCIN/FORTICAL) 200 UNIT/ACT nasal spray Place 1 spray into alternate nostrils daily. 3.7 mL 11  . Cholecalciferol 2000 UNITS CAPS Take 5,000 Units by mouth 2 (two) times daily.     Marland Kitchen CRANBERRY FRUIT PO Take 1 each by mouth every evening.     . Cyanocobalamin (VITAMIN B-12) 500 MCG SUBL 1 tab sl qd (Patient taking differently: Take 500 tablets by mouth 2 (two) times daily. ) 100 tablet 11   . doxycycline (VIBRA-TABS) 100 MG tablet Take 1 tablet (100 mg total) by mouth 2 (two) times daily for 10 days. 20 tablet 0  . fluticasone (FLONASE) 50 MCG/ACT nasal spray Place 1 spray into both nostrils daily as needed. 16 g 11  . Garlic 7893 MG CAPS Take 1,000 mg by mouth every evening.     . Multiple Vitamins-Minerals (MULTIVITAMIN WITH MINERALS) tablet Take 1 tablet by mouth every evening.     . nadolol (CORGARD) 40 MG tablet Take 0.5 tablets (20 mg total) by mouth 2 (two) times daily. 30 tablet 11  . pantoprazole (PROTONIX) 40 MG tablet Take 1 tablet (40 mg total) by mouth 2 (two) times daily before a meal. 60 tablet 0  . Pyridoxine HCl (VITAMIN B-6 PO) Take 100 mcg by mouth 2 (two) times daily.     . ranitidine (ZANTAC) 150 MG tablet Take 75 mg by mouth 2 (two) times daily.    . TURMERIC PO Take 1 tablet by mouth 2 (two) times daily.      No current facility-administered medications for this visit.     Allergies: Meloxicam; Paroxetine; Penicillins; Ciprofloxacin; Clarithromycin; and Zithromax [azithromycin]  Past Medical History:  Diagnosis Date  . Allergic rhinitis   . Benign paroxysmal positional vertigo 06/14/2015  . Breast cancer ([place]) 2008  . Bronchiectasis ([place])   . GERD (gastroesophageal reflux disease)   . HTN (hypertension)   . Hx of breast cancer   . Osteopenia   . Personal history of radiation therapy 2008    Past Surgical History:  Procedure Laterality Date  . ABDOMINAL HYSTERECTOMY    .  APPENDECTOMY    . BREAST LUMPECTOMY  2008   Left  . NASAL SINUS SURGERY  1997    Family History  Problem Relation Age of Onset  . Sjogren's syndrome Mother   . Arthritis Father 62       RA  . Emphysema Father   . Heart disease Father   . Hypertension Other   . Hodgkin's lymphoma Brother     Social History   Tobacco Use  . Smoking status: Never Smoker  . Smokeless tobacco: Never Used  Substance Use Topics  . Alcohol use: No    Alcohol/week: 0.0 oz    Subjective:   Patient presents with concerns for sinus infection; + teeth pressure; denies any fever; has used OTC Mucinex D and Flonase; Denies any abnormal chest pain/ shortness of breath; Prone to bronchitis/ sinusitis;   Objective:  Vitals:   09/22/17 1334  BP: 124/82  Pulse: 65  Temp: 98.1 F (36.7 C)  TempSrc: Oral  SpO2: 98%  Weight: 106 lb (48.1 kg)  Height: 5\' 3"  (1.6 m)    General: Well developed, well nourished, in no acute distress  Skin : Warm and dry.  Head: Normocephalic and atraumatic  Eyes: Sclera and conjunctiva clear; pupils round and reactive to light; extraocular movements intact  Ears: External normal; canals clear; tympanic membranes normal  Oropharynx: Pink, supple. No suspicious lesions  Neck: Supple without thyromegaly, adenopathy  Lungs: Respirations unlabored; clear to auscultation bilaterally without wheeze, rales, rhonchi  CVS exam: normal rate and regular rhythm.  Neurologic: Alert and oriented; speech intact; face symmetrical; moves all extremities well; CNII-XII intact without focal deficit   Assessment:  1. Acute sinusitis, recurrence not specified, unspecified location     Plan:  Rx for Doxycycline 100 mg bid x 10 days; increase fluids, rest and follow up worse, no better.    No follow-ups on file.  No orders of the defined types were placed in this encounter.   Requested Prescriptions   Signed Prescriptions Disp Refills  . doxycycline (VIBRA-TABS) 100 MG tablet 20 tablet 0    Sig: Take 1 tablet (100 mg total) by mouth 2 (two) times daily for 10 days.

## 2017-11-16 ENCOUNTER — Ambulatory Visit (INDEPENDENT_AMBULATORY_CARE_PROVIDER_SITE_OTHER)
Admission: RE | Admit: 2017-11-16 | Discharge: 2017-11-16 | Disposition: A | Discharge status: Home / Self Care | Payer: Medicare Other | Source: Ambulatory Visit | Attending: Acute Care | Admitting: Acute Care

## 2017-11-16 DIAGNOSIS — J471 Bronchiectasis with (acute) exacerbation: Secondary | ICD-10-CM

## 2017-11-21 ENCOUNTER — Encounter: Payer: Self-pay | Admitting: Pulmonary Disease

## 2017-11-21 ENCOUNTER — Ambulatory Visit (INDEPENDENT_AMBULATORY_CARE_PROVIDER_SITE_OTHER): Payer: Medicare Other | Admitting: Pulmonary Disease

## 2017-11-21 VITALS — BP 144/80 | HR 55 | Ht 63.0 in | Wt 104.6 lb

## 2017-11-21 DIAGNOSIS — J471 Bronchiectasis with (acute) exacerbation: Secondary | ICD-10-CM | POA: Diagnosis not present

## 2017-11-21 MED ORDER — ALBUTEROL SULFATE HFA 108 (90 BASE) MCG/ACT IN AERS: 2.0000 | INHALATION_SPRAY | Freq: Four times a day (QID) | RESPIRATORY_TRACT | 3 refills | Status: DC | PRN

## 2017-11-21 NOTE — Progress Notes (Addendum)
Lindsay Schultz    [id number]    1951/04/10  Primary Care Physician:Plotnikov, Evie Lacks, MD  Referring Physician: Cassandria Anger, MD [place], [place] [id number]  Chief complaint:   Follow up for Asthmatic Bronchitis Bronchiectasis Sinusitis  Seasonal allergies GERD  HPI: Lindsay Schultz is a 67 year old female with history of asthmatic bronchitis, sinusitis, and seasonal allergies.  She's had a workup for her bronchiectasis including CBC, Ig levels, sputum cultures, A1AT levels, RF which were normal. PFTs do not show any obstruction and very mild restriction and reduction in diffusion capacity. Her husband has recently diagnosed with colon cancer s/p resection and chemo in 2017. Lindsay Schultz was stressed during all this but he is doing better now with cancer in remission.  She has attacks of bronchiectasis exacerbations about once a year. She states that the infection usually begins in the sinus and then moves into the lung. This usually resolves after course of antibiotics. She has H/O seasonal allergies (worst in fall and spring) and asthmatic bronchitis. Never smoked.   She was seen in the clinic in September 2017 for cough, hemoptysis.She was treated with doxycycline and cough suppressant which did not help with the symptoms. She subsequently got hospitalized last week and given IV antibiotics, increase in acid suppression with Protonix and Pepcid.   Interim history: Seen in the pulmonary clinic in February 2019 with bronchiectasis exacerbation with mild hemoptysis which is resolved.  She has had a follow-up CT scan last week and is here for review.  She is using a flutter valve daily but feels it is not effective in bringing up her mucus No fevers, chills, wheezing.  Outpatient Encounter Medications as of 11/21/2017  Medication Sig  . butalbital-acetaminophen-caffeine (FIORICET, ESGIC) 50-325-40 MG tablet Take 1 tablet by mouth every 6 (six) hours as needed for  headache.  . calcitonin, salmon, (MIACALCIN/FORTICAL) 200 UNIT/ACT nasal spray Place 1 spray into alternate nostrils daily.  . Cholecalciferol 2000 UNITS CAPS Take 5,000 Units by mouth 2 (two) times daily.   Marland Kitchen CRANBERRY FRUIT PO Take 1 each by mouth every evening.   . Cyanocobalamin (VITAMIN B-12) 500 MCG SUBL 1 tab sl qd (Patient taking differently: Take 500 tablets by mouth 2 (two) times daily. )  . fluticasone (FLONASE) 50 MCG/ACT nasal spray Place 1 spray into both nostrils daily as needed.  . Garlic 6712 MG CAPS Take 1,000 mg by mouth every evening.   . Multiple Vitamins-Minerals (MULTIVITAMIN WITH MINERALS) tablet Take 1 tablet by mouth every evening.   . nadolol (CORGARD) 40 MG tablet Take 0.5 tablets (20 mg total) by mouth 2 (two) times daily.  . pantoprazole (PROTONIX) 40 MG tablet Take 1 tablet (40 mg total) by mouth 2 (two) times daily before a meal.  . Pyridoxine HCl (VITAMIN B-6 PO) Take 100 mcg by mouth 2 (two) times daily.   . ranitidine (ZANTAC) 150 MG tablet Take 75 mg by mouth 2 (two) times daily.  . TURMERIC PO Take 1 tablet by mouth 2 (two) times daily.    No facility-administered encounter medications on file as of 11/21/2017.     Allergies as of 11/21/2017 - Review Complete 09/22/2017  Allergen Reaction Noted  . Meloxicam Nausea And Vomiting 09/26/2012  . Paroxetine Other (See Comments) 11/27/2013  . Penicillins Other (See Comments) 06/12/2007  . Ciprofloxacin Rash 06/12/2007  . Clarithromycin Rash 06/12/2007  . Zithromax [azithromycin] Rash 10/30/2014    Past Medical History:  Diagnosis  Date  . Allergic rhinitis   . Benign paroxysmal positional vertigo 06/14/2015  . Breast cancer ([place]) 2008  . Bronchiectasis ([place])   . GERD (gastroesophageal reflux disease)   . HTN (hypertension)   . Hx of breast cancer   . Osteopenia   . Personal history of radiation therapy 2008    Past Surgical History:  Procedure Laterality Date  . ABDOMINAL HYSTERECTOMY    .  APPENDECTOMY    . BREAST LUMPECTOMY  2008   Left  . NASAL SINUS SURGERY  1997    Family History  Problem Relation Age of Onset  . Sjogren's syndrome Mother   . Arthritis Father 46       RA  . Emphysema Father   . Heart disease Father   . Hypertension Other   . Hodgkin's lymphoma Brother     Social History   Socioeconomic History  . Marital status: Married    Spouse name: Not on file  . Number of children: Not on file  . Years of education: Not on file  . Highest education level: Not on file  Occupational History  . Occupation: Retired    Comment: Orthoptist  Social Needs  . Financial resource strain: Not on file  . Food insecurity:    Worry: Not on file    Inability: Not on file  . Transportation needs:    Medical: Not on file    Non-medical: Not on file  Tobacco Use  . Smoking status: Never Smoker  . Smokeless tobacco: Never Used  Substance and Sexual Activity  . Alcohol use: No    Alcohol/week: 0.0 oz  . Drug use: No  . Sexual activity: Yes  Lifestyle  . Physical activity:    Days per week: Not on file    Minutes per session: Not on file  . Stress: Not on file  Relationships  . Social connections:    Talks on phone: Not on file    Gets together: Not on file    Attends religious service: Not on file    Active member of club or organization: Not on file    Attends meetings of clubs or organizations: Not on file    Relationship status: Not on file  . Intimate partner violence:    Fear of current or ex partner: Not on file    Emotionally abused: Not on file    Physically abused: Not on file    Forced sexual activity: Not on file  Other Topics Concern  . Not on file  Social History Narrative   Regular Exercise- yes   Review of systems: Review of Systems  Constitutional: Negative for fever and chills.  HENT: Negative.   Eyes: Negative for blurred vision.  Respiratory: as per HPI  Cardiovascular: Negative for chest pain and palpitations.    Gastrointestinal: Negative for vomiting, diarrhea, blood per rectum. Genitourinary: Negative for dysuria, urgency, frequency and hematuria.  Musculoskeletal: Negative for myalgias, back pain and joint pain.  Skin: Negative for itching and rash.  Neurological: Negative for dizziness, tremors, focal weakness, seizures and loss of consciousness.  Endo/Heme/Allergies: Negative for environmental allergies.  Psychiatric/Behavioral: Negative for depression, suicidal ideas and hallucinations.  All other systems reviewed and are negative.  Physical Exam: Blood pressure (!) 144/80, pulse (!) 55, height 5\' 3"  (1.6 m), weight 104 lb 9.6 oz (47.4 kg), SpO2 98 %. Gen:      No acute distress HEENT:  EOMI, sclera anicteric Neck:     No  masses; no thyromegaly Lungs:    Clear to auscultation bilaterally; normal respiratory effort CV:         Regular rate and rhythm; no murmurs Abd:      + bowel sounds; soft, non-tender; no palpable masses, no distension Ext:    No edema; adequate peripheral perfusion Skin:      Warm and dry; no rash Neuro: alert and oriented x 3 Psych: normal mood and affect  Data Reviewed: Imaging CT scan 11/15/14-Multiple nodules in the left upper lobe, bilateral bronchiectasis. CT scan 01/24/16 Stable pulmonary nodules, bilateral bronchiectasis. Left upper lobe ground glass opacities. Images reviewed. CT scan 11/16/2017- stable pulmonary nodules, bilateral bronchiectasis, new area of opacity in the right upper lobe consistent with mucus impaction.  I have reviewed the images personally  Labs 12/09/14 IgG-773 IgG 8-2 37 IgM-64 IgE-56 Rheumatoid factor < 10 All within normal limits.  AFB and fungal cultures- no acid-fast bacilli, Candida albicans.  PFTs (02/10/15) FVC 2.35 (74%), FEV1 1.79 (74%), F/F 77, TLC 79%, DLCO 78% No obstruction. Mild reduction in lung volumes and diffusion capacity  Assessment:  Bronchiectasis, Intermittent Hemoptysis. Work-up for bronchiectasis  negative as noted above She had hemoptysis in 2017 and again in February 2019 associated with bronchiectasis exacerbation. She still has cough which has been ongoing for many years. We will stop the flutter wall and give her a percussion vest.  Abnormal CT scan with lung nodules Likely benign as most of them are remained stable.  Her last CT scan from 7/31 shows new area of opacity in the right upper lobe which is consistent with mucoid impaction. Follow-up CT scan in 6 months  Asthmatic  Bronchitis She is asymptomatic with symptoms mostly during spring and fall allergy. She'll continue the albuterol when necessary. Follow-up PFTs as her last test was in 2016  GERD Currently on Zantac.  She has stopped her Protonix  Health maintenance 12/13/2016-Influenza 02/07/2016-Prevnar 05/28/2014-Pneumovax  Plan/Recommendations: - Continue albuterol as needed - Prescribe percussion vest - Follow-up CT in 6 months - Pulmonary function test  Marshell Garfinkel MD [facility] 11/21/2017, 11:08 AM  CC: Plotnikov, Evie Lacks, MD

## 2017-11-21 NOTE — Patient Instructions (Addendum)
I have reviewed the CT scan which shows stable lung nodules and some mucus associated with bronchiectasis We will continue to monitor this with a repeat CT scan in 6 months We will get a percussion vest for you as a flutter valve is not very effective We will send in a prescription for albuterol inhaler and schedule you for pulmonary function test Follow-up in 6 months.

## 2017-11-24 ENCOUNTER — Telehealth: Payer: Self-pay | Admitting: Pulmonary Disease

## 2017-11-24 NOTE — Telephone Encounter (Signed)
Attempted to contact pt. I did not receive an answer. There was no option for me to leave a message. Will try back.  

## 2017-11-25 NOTE — Telephone Encounter (Signed)
Pt is returning call. Cb is [phone].

## 2017-11-25 NOTE — Telephone Encounter (Signed)
Spoke with pt, she states she wanted to make sure that [employer] is the company for the vest. I advised her that it was and that she should call them back to discuss. Pt understand and nothing further is needed.

## 2017-12-05 ENCOUNTER — Telehealth: Payer: Self-pay | Admitting: Pulmonary Disease

## 2017-12-05 MED ORDER — DOXYCYCLINE HYCLATE 100 MG PO TABS: 100.0000 mg | ORAL_TABLET | Freq: Two times a day (BID) | ORAL | 0 refills | Status: DC

## 2017-12-05 MED ORDER — PREDNISONE 10 MG PO TABS: ORAL_TABLET | ORAL | 0 refills | Status: DC

## 2017-12-05 NOTE — Telephone Encounter (Signed)
Yes. Ok to get the sputum cx on 8/27

## 2017-12-05 NOTE — Telephone Encounter (Signed)
Tried to call, busy signal twice. Tried twice no answer. X1 Will keep in triage to try again.

## 2017-12-05 NOTE — Telephone Encounter (Signed)
Spoke with pt. She is aware of this medication change. Both rxs have been sent in. Nothing further was needed.

## 2017-12-05 NOTE — Telephone Encounter (Signed)
Call in augmentin 875 mg bid for 7 days and prednisone taper starting at 40 mg. Reduce dose by 10 mg every 3 days.  Check sputum for regular cultures, AFB and fungus.

## 2017-12-05 NOTE — Telephone Encounter (Signed)
Called spoke with patient, advised of Dr Matilde Bash recommendations as stated below Patient voiced her understanding and denied any questions regarding Rx's  1-  However, patient is concerned about the sputum cultures >> she lives [place]mi [place] and is unable to come to the office to pick up the specimen cups.  She has an upcoming appt with Dr Alain Marion next week on 8/27 and would like to know if she may get the sputum cups at this time.  2-  When sending Rx's to verified pharmacy, received warning about the Augmentin and patient's documented PCN allergy.  No documentation on file that patient has taken and tolerated this medication in the past.  Dr Vaughan Browner please advise, thank you

## 2017-12-05 NOTE — Telephone Encounter (Signed)
Prescribe doxy 100 mg bid for 7 days

## 2017-12-05 NOTE — Telephone Encounter (Signed)
Spoke with pt. She is aware that she can do her sputum cultures on 8/27.  Dr. Vaughan Browner - please advise on Augmentin prescription has the pt is allergic to PCN. Thanks.

## 2017-12-05 NOTE — Telephone Encounter (Signed)
Called and spoke with patient not feeling well Pt starting using cough vest on 12/01/2017 with [employer]  Pt reports increase fatigue, productive cough-green thick mucus, SOB, wheezing, and feel weak. Pt denies fever, states she has chills for last 6 days. Pt seen Dr. Vaughan Browner last on 11/21/2017; denied ov today Pt requesting something to be called into her pharmacy if possible.  Dr. Vaughan Browner please advise.

## 2017-12-13 ENCOUNTER — Ambulatory Visit (INDEPENDENT_AMBULATORY_CARE_PROVIDER_SITE_OTHER): Payer: Medicare Other | Admitting: Internal Medicine

## 2017-12-13 ENCOUNTER — Encounter: Payer: Self-pay | Admitting: Internal Medicine

## 2017-12-13 ENCOUNTER — Other Ambulatory Visit (INDEPENDENT_AMBULATORY_CARE_PROVIDER_SITE_OTHER): Payer: Medicare Other

## 2017-12-13 VITALS — BP 136/84 | HR 50 | Temp 97.9°F | Ht 63.0 in | Wt 105.0 lb

## 2017-12-13 DIAGNOSIS — E538 Deficiency of other specified B group vitamins: Secondary | ICD-10-CM | POA: Diagnosis not present

## 2017-12-13 DIAGNOSIS — J471 Bronchiectasis with (acute) exacerbation: Secondary | ICD-10-CM | POA: Diagnosis not present

## 2017-12-13 DIAGNOSIS — Z0000 Encounter for general adult medical examination without abnormal findings: Secondary | ICD-10-CM | POA: Diagnosis not present

## 2017-12-13 DIAGNOSIS — Z23 Encounter for immunization: Secondary | ICD-10-CM | POA: Diagnosis not present

## 2017-12-13 DIAGNOSIS — J4520 Mild intermittent asthma, uncomplicated: Secondary | ICD-10-CM

## 2017-12-13 DIAGNOSIS — I10 Essential (primary) hypertension: Secondary | ICD-10-CM

## 2017-12-13 LAB — CBC WITH DIFFERENTIAL/PLATELET
BASOS PCT: 0.7 % (ref 0.0–3.0)
Basophils Absolute: 0.1 10*3/uL (ref 0.0–0.1)
EOS ABS: 0.1 10*3/uL (ref 0.0–0.7)
EOS PCT: 0.7 % (ref 0.0–5.0)
HCT: 40.9 % (ref 36.0–46.0)
HEMOGLOBIN: 13.6 g/dL (ref 12.0–15.0)
LYMPHS PCT: 29.4 % (ref 12.0–46.0)
Lymphs Abs: 3.1 10*3/uL (ref 0.7–4.0)
MCHC: 33.2 g/dL (ref 30.0–36.0)
MCV: 89.8 fl (ref 78.0–100.0)
MONOS PCT: 10.2 % (ref 3.0–12.0)
Monocytes Absolute: 1.1 10*3/uL — ABNORMAL HIGH (ref 0.1–1.0)
NEUTROS PCT: 59 % (ref 43.0–77.0)
Neutro Abs: 6.1 10*3/uL (ref 1.4–7.7)
PLATELETS: 242 10*3/uL (ref 150.0–400.0)
RBC: 4.55 Mil/uL (ref 3.87–5.11)
RDW: 13.3 % (ref 11.5–15.5)
WBC: 10.4 10*3/uL (ref 4.0–10.5)

## 2017-12-13 LAB — BASIC METABOLIC PANEL
BUN: 17 mg/dL (ref 6–23)
CALCIUM: 9.9 mg/dL (ref 8.4–10.5)
CO2: 33 mEq/L — ABNORMAL HIGH (ref 19–32)
Chloride: 101 mEq/L (ref 96–112)
Creatinine, Ser: 0.78 mg/dL (ref 0.40–1.20)
GFR: 78.38 mL/min (ref >60.00)
GLUCOSE: 89 mg/dL (ref 70–99)
Potassium: 3.3 mEq/L — ABNORMAL LOW (ref 3.5–5.1)
Sodium: 141 mEq/L (ref 135–145)

## 2017-12-13 LAB — HEPATIC FUNCTION PANEL
ALK PHOS: 96 U/L (ref 39–117)
ALT: 15 U/L (ref 0–35)
AST: 16 U/L (ref 0–37)
Albumin: 4.3 g/dL (ref 3.5–5.2)
BILIRUBIN DIRECT: 0.1 mg/dL (ref 0.0–0.3)
BILIRUBIN TOTAL: 0.5 mg/dL (ref 0.2–1.2)
TOTAL PROTEIN: 6.8 g/dL (ref 6.0–8.3)

## 2017-12-13 LAB — URINALYSIS
BILIRUBIN URINE: NEGATIVE
Hgb urine dipstick: NEGATIVE
KETONES UR: NEGATIVE
LEUKOCYTES UA: NEGATIVE
Nitrite: NEGATIVE
PH: 5.5 (ref 5.0–8.0)
Specific Gravity, Urine: 1.025 (ref 1.000–1.030)
Total Protein, Urine: NEGATIVE
URINE GLUCOSE: NEGATIVE
Urobilinogen, UA: 0.2 (ref 0.0–1.0)

## 2017-12-13 LAB — LIPID PANEL
CHOLESTEROL: 218 mg/dL — AB (ref 0–200)
HDL: 56.6 mg/dL (ref >39.00)
NonHDL: 160.95
Total CHOL/HDL Ratio: 4
Triglycerides: 203 mg/dL — ABNORMAL HIGH (ref 0.0–149.0)
VLDL: 40.6 mg/dL — AB (ref 0.0–40.0)

## 2017-12-13 LAB — TSH: TSH: 1.38 u[IU]/mL (ref 0.35–4.50)

## 2017-12-13 LAB — LDL CHOLESTEROL, DIRECT: Direct LDL: 140 mg/dL

## 2017-12-13 LAB — VITAMIN B12: Vitamin B-12: 1166 pg/mL — ABNORMAL HIGH (ref 211–911)

## 2017-12-13 MED ORDER — CALCITONIN (SALMON) 200 UNIT/ACT NA SOLN: 1.0000 | Freq: Every day | NASAL | 11 refills | Status: DC

## 2017-12-13 MED ORDER — NADOLOL 40 MG PO TABS: 20.0000 mg | ORAL_TABLET | Freq: Two times a day (BID) | ORAL | 11 refills | Status: DC

## 2017-12-13 MED ORDER — FLUTICASONE PROPIONATE 50 MCG/ACT NA SUSP: 1.0000 | Freq: Every day | NASAL | 11 refills | Status: DC | PRN

## 2017-12-13 MED ORDER — ZOSTER VAC RECOMB ADJUVANTED 50 MCG/0.5ML IM SUSR: 0.5000 mL | Freq: Once | INTRAMUSCULAR | 1 refills | Status: AC

## 2017-12-13 NOTE — Assessment & Plan Note (Signed)
Nadolol 

## 2017-12-13 NOTE — Addendum Note (Signed)
Addended by: Karren Cobble on: 12/13/2017 03:10 PM   Modules accepted: Orders

## 2017-12-13 NOTE — Progress Notes (Signed)
Subjective:  Patient ID: Lindsay Schultz, female    DOB: 1951-01-06  Age: 67 y.o. MRN: [id number]  CC: No chief complaint on file.   HPI Elvina Bosch Claude presents for HAs, osteoporosis, B12 def f/u. Shingrix discussed  Outpatient Medications Prior to Visit  Medication Sig Dispense Refill  . albuterol (PROVENTIL HFA;VENTOLIN HFA) 108 (90 Base) MCG/ACT inhaler Inhale 2 puffs into the lungs every 6 (six) hours as needed for wheezing or shortness of breath. 1 Inhaler 3  . butalbital-acetaminophen-caffeine (FIORICET, ESGIC) 50-325-40 MG tablet Take 1 tablet by mouth every 6 (six) hours as needed for headache. 100 tablet 2  . calcitonin, salmon, (MIACALCIN/FORTICAL) 200 UNIT/ACT nasal spray Place 1 spray into alternate nostrils daily. 3.7 mL 11  . Cholecalciferol 2000 UNITS CAPS Take 5,000 Units by mouth 2 (two) times daily.     Marland Kitchen CRANBERRY FRUIT PO Take 1 each by mouth every evening.     . Cyanocobalamin (VITAMIN B-12) 500 MCG SUBL 1 tab sl qd (Patient taking differently: Take 500 tablets by mouth 2 (two) times daily. ) 100 tablet 11  . doxycycline (VIBRA-TABS) 100 MG tablet Take 1 tablet (100 mg total) by mouth 2 (two) times daily. 14 tablet 0  . fluticasone (FLONASE) 50 MCG/ACT nasal spray Place 1 spray into both nostrils daily as needed. 16 g 11  . Garlic 2426 MG CAPS Take 1,000 mg by mouth every evening.     . Multiple Vitamins-Minerals (MULTIVITAMIN WITH MINERALS) tablet Take 1 tablet by mouth every evening.     . nadolol (CORGARD) 40 MG tablet Take 0.5 tablets (20 mg total) by mouth 2 (two) times daily. 30 tablet 11  . predniSONE (DELTASONE) 10 MG tablet Take 4 tabs once daily x3 days, then 3 tabs for 3 days, 2 tabs for 3 days, 1 tab for 3 days and stop. 30 tablet 0  . Pyridoxine HCl (VITAMIN B-6 PO) Take 100 mcg by mouth 2 (two) times daily.     . ranitidine (ZANTAC) 150 MG tablet Take 75 mg by mouth 2 (two) times daily.    . TURMERIC PO Take 1 tablet by mouth 2 (two) times daily.      No  facility-administered medications prior to visit.     ROS: Review of Systems  Constitutional: Negative for activity change, appetite change, chills, fatigue and unexpected weight change.  HENT: Negative for congestion, mouth sores and sinus pressure.   Eyes: Negative for visual disturbance.  Respiratory: Negative for cough and chest tightness.   Gastrointestinal: Negative for abdominal pain and nausea.  Genitourinary: Negative for difficulty urinating, frequency and vaginal pain.  Musculoskeletal: Negative for back pain and gait problem.  Skin: Negative for pallor and rash.  Neurological: Negative for dizziness, tremors, weakness, numbness and headaches.  Psychiatric/Behavioral: Negative for confusion and sleep disturbance.    Objective:  BP 136/84 (BP Location: Left Arm, Patient Position: Sitting, Cuff Size: Normal)   Pulse (!) 50   Temp 97.9 F (36.6 C) (Oral)   Ht 5\' 3"  (1.6 m)   Wt 105 lb (47.6 kg)   SpO2 97%   BMI 18.60 kg/m   BP Readings from Last 3 Encounters:  12/13/17 136/84  11/21/17 (!) 144/80  09/22/17 124/82    Wt Readings from Last 3 Encounters:  12/13/17 105 lb (47.6 kg)  11/21/17 104 lb 9.6 oz (47.4 kg)  09/22/17 106 lb (48.1 kg)    Physical Exam  Constitutional: She appears well-developed. No distress.  HENT:  Head:  Normocephalic.  Right Ear: External ear normal.  Left Ear: External ear normal.  Nose: Nose normal.  Mouth/Throat: Oropharynx is clear and moist.  Eyes: Pupils are equal, round, and reactive to light. Conjunctivae are normal. Right eye exhibits no discharge. Left eye exhibits no discharge.  Neck: Normal range of motion. Neck supple. No JVD present. No tracheal deviation present. No thyromegaly present.  Cardiovascular: Normal rate, regular rhythm and normal heart sounds.  Pulmonary/Chest: No stridor. No respiratory distress. She has no wheezes.  Abdominal: Soft. Bowel sounds are normal. She exhibits no distension and no mass. There is  no tenderness. There is no rebound and no guarding.  Musculoskeletal: She exhibits no edema or tenderness.  Lymphadenopathy:    She has no cervical adenopathy.  Neurological: She displays normal reflexes. No cranial nerve deficit. She exhibits normal muscle tone. Coordination normal.  Skin: No rash noted. No erythema.  Psychiatric: She has a normal mood and affect. Her behavior is normal. Judgment and thought content normal.  thin  Lab Results  Component Value Date   WBC 6.7 08/09/2017   HGB 13.3 08/09/2017   HCT 39.1 08/09/2017   PLT 193.0 08/09/2017   GLUCOSE 86 08/09/2017   CHOL 235 (H) 12/13/2016   TRIG 204.0 (H) 12/13/2016   HDL 46.70 12/13/2016   LDLDIRECT 147.0 12/13/2016   LDLCALC 163 (H) 11/07/2015   ALT 11 08/09/2017   AST 17 08/09/2017   NA 142 08/09/2017   K 3.9 08/09/2017   CL 104 08/09/2017   CREATININE 0.68 08/09/2017   BUN 15 08/09/2017   CO2 33 (H) 08/09/2017   TSH 1.42 12/13/2016   INR 1.05 01/26/2016    Ct Chest Wo Contrast  Result Date: 11/16/2017 CLINICAL DATA:  Bronchiectasis. EXAM: CT CHEST WITHOUT CONTRAST TECHNIQUE: Multidetector CT imaging of the chest was performed following the standard protocol without IV contrast. COMPARISON:  11/15/2016 FINDINGS: Cardiovascular: The heart size is mildly enlarged. There is aortic atherosclerosis. Calcification in the LAD coronary artery identified. Mediastinum/Nodes: Status post left hemi thyroidectomy. The trachea appears patent and is midline. Normal appearance of the esophagus. No enlarged mediastinal or hilar lymph nodes identified. No axillary or supraclavicular adenopathy. Lungs/Pleura: No pleural effusion. No acute airspace consolidation, atelectasis or pneumothorax. 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 Similar appearance of subsegmental scarring and  chronic consolidation involving the medial right middle lobe, anteromedial left upper lobe and lingula. New branching nodular density within the lateral right upper lobe is identified, image 62/5 and is favored to represent an area of distal bronchial mucoid impaction. Similar appearance of right upper lobe branching tubular density compatible with chronic mucoid impaction, image 69/5. The previously referenced left upper lobe lung nodule is stable measuring 6 mm, image 62/3. Upper Abdomen: No acute abnormality. Musculoskeletal: Mild spondylosis identified within the thoracic spine. IMPRESSION: 1. 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 These changes include bronchiectasis, distal bronchial mucoid impaction, tree-in-bud nodularity and scarring. The nodular densities referenced on previous exam are stable in the interval. 2. Within the right upper lobe there is a new branching nodular density which is favored to represent un area of new distal mucoid impaction. 3.  Aortic Atherosclerosis (ICD10-I70.0). 4. Lad coronary artery atherosclerotic calcifications. Electronically Signed   By: Kerby Moors M.D.   On: 11/16/2017 15:17    Assessment & Plan:   There  are no diagnoses linked to this encounter.   No orders of the defined types were placed in this encounter.    Follow-up: No follow-ups on file.  Walker Kehr, MD

## 2017-12-13 NOTE — Assessment & Plan Note (Signed)
Proventil prn 

## 2017-12-13 NOTE — Assessment & Plan Note (Addendum)
F/u w/Dr Floreen Comber treatment bid High protein meals

## 2017-12-13 NOTE — Assessment & Plan Note (Signed)
On B12 

## 2017-12-14 ENCOUNTER — Other Ambulatory Visit: Payer: Self-pay | Admitting: Internal Medicine

## 2017-12-14 MED ORDER — POTASSIUM CHLORIDE ER 10 MEQ PO TBCR: 10.0000 meq | EXTENDED_RELEASE_TABLET | Freq: Every day | ORAL | 0 refills | Status: DC

## 2017-12-23 ENCOUNTER — Other Ambulatory Visit: Payer: Self-pay

## 2017-12-23 ENCOUNTER — Ambulatory Visit (INDEPENDENT_AMBULATORY_CARE_PROVIDER_SITE_OTHER)
Admission: RE | Admit: 2017-12-23 | Discharge: 2017-12-23 | Disposition: A | Discharge status: Home / Self Care | Payer: Medicare Other | Source: Ambulatory Visit | Attending: Nurse Practitioner | Admitting: Nurse Practitioner

## 2017-12-23 ENCOUNTER — Ambulatory Visit (INDEPENDENT_AMBULATORY_CARE_PROVIDER_SITE_OTHER): Payer: Medicare Other | Admitting: Nurse Practitioner

## 2017-12-23 ENCOUNTER — Telehealth: Payer: Self-pay | Admitting: Nurse Practitioner

## 2017-12-23 ENCOUNTER — Other Ambulatory Visit: Payer: Medicare Other

## 2017-12-23 ENCOUNTER — Encounter: Payer: Self-pay | Admitting: Nurse Practitioner

## 2017-12-23 VITALS — BP 138/80 | HR 64 | Ht 63.0 in | Wt 104.0 lb

## 2017-12-23 DIAGNOSIS — B370 Candidal stomatitis: Secondary | ICD-10-CM | POA: Diagnosis not present

## 2017-12-23 DIAGNOSIS — J471 Bronchiectasis with (acute) exacerbation: Secondary | ICD-10-CM

## 2017-12-23 MED ORDER — IPRATROPIUM-ALBUTEROL 0.5-2.5 (3) MG/3ML IN SOLN
3.0000 mL | Freq: Once | RESPIRATORY_TRACT | Status: AC
  Administered 2017-12-23: 3 mL via RESPIRATORY_TRACT

## 2017-12-23 MED ORDER — NYSTATIN 100000 UNIT/ML MT SUSP: 5.0000 mL | Freq: Four times a day (QID) | OROMUCOSAL | 0 refills | Status: DC

## 2017-12-23 MED ORDER — DOXYCYCLINE HYCLATE 100 MG PO TABS: 100.0000 mg | ORAL_TABLET | Freq: Two times a day (BID) | ORAL | 0 refills | Status: DC

## 2017-12-23 MED ORDER — NYSTATIN 100000 UNIT/ML MT SUSP: OROMUCOSAL | 0 refills | Status: DC

## 2017-12-23 NOTE — Progress Notes (Signed)
@Patient  ID: Lindsay Schultz, female    DOB: 1950/05/17, 67 y.o.   MRN: [id number]  Chief Complaint  Patient presents with  . Cough    congestion, feels tired    Referring provider: Plotnikov, Evie Lacks, MD    HPI   67 year old female with asthmatic bronchitis and seasonal allergies followed by Dr. Vaughan Browner.   Tests: Imaging CT scan 11/15/14-Multiple nodules in the left upper lobe, bilateral bronchiectasis. CT scan 01/24/16 Stable pulmonary nodules, bilateral bronchiectasis. Left upper lobe ground glass opacities. Images reviewed. CT scan 11/16/2017- stable pulmonary nodules, bilateral bronchiectasis, new area of opacity in the right upper lobe consistent with mucus impaction.  I have reviewed the images personally  Labs 12/09/14 IgG-773 IgG 8-2 37 IgM-64 IgE-56 Rheumatoid factor < 10 All within normal limits.  AFB and fungal cultures- no acid-fast bacilli, Candida albicans.  PFTs (02/10/15) FVC 2.35 (74%), FEV1 1.79 (74%), F/F 77, TLC 79%, DLCO 78% No obstruction. Mild reduction in lung volumes and diffusion capacity  OV 12/23/17 - Acute Patient presents with congestion, cough with yellow sputum, and fatigue. States that symptoms have progressively worsened over the past couple of weeks. Denies any fever, shortness of breath, or chest pain. Has been compliant with medications. She has completed a 7 day course of doxycycline and prednisone taper. She also complains of irritation to tongue since completing these medications - thinks that she may have thrush.    Allergies  Allergen Reactions  . Meloxicam Nausea And Vomiting    Upset stomach  . Paroxetine Other (See Comments)    Passed out  . Penicillins Other (See Comments)    passed out after a shot at 67 y.o  Has patient had a PCN reaction causing immediate rash, facial/tongue/throat swelling, SOB or lightheadedness with hypotension: no Has patient had a PCN reaction causing severe rash involving mucus membranes or skin  necrosis:  no Has patient had a PCN reaction that required hospitalization: no Has patient had a PCN reaction occurring within the last 10 years: no If all of the above answers are "NO", then may proceed with Cephalosporin use.   . Ciprofloxacin Rash     rash along the vein w/IV Cipro  . Clarithromycin Rash  . Zithromax [Azithromycin] Rash    Immunization History  Administered Date(s) Administered  . Influenza Split 02/15/2012, 02/13/2015  . Influenza Whole 02/02/2008, 01/29/2009, 12/12/2009  . Influenza, High Dose Seasonal PF 12/13/2016  . Influenza,inj,Quad PF,6+ Mos 02/23/2013, 02/25/2014, 02/02/2016, 12/13/2017  . Pneumococcal Conjugate-13 11/07/2015  . Pneumococcal Polysaccharide-23 05/28/2014  . Tdap 09/26/2012    Past Medical History:  Diagnosis Date  . Allergic rhinitis   . Benign paroxysmal positional vertigo 06/14/2015  . Breast cancer ([place]) 2008  . Bronchiectasis ([place])   . GERD (gastroesophageal reflux disease)   . HTN (hypertension)   . Hx of breast cancer   . Osteopenia   . Personal history of radiation therapy 2008    Tobacco History: Social History   Tobacco Use  Smoking Status Never Smoker  Smokeless Tobacco Never Used   Counseling given: Yes   Outpatient Encounter Medications as of 12/23/2017  Medication Sig  . albuterol (PROVENTIL HFA;VENTOLIN HFA) 108 (90 Base) MCG/ACT inhaler Inhale 2 puffs into the lungs every 6 (six) hours as needed for wheezing or shortness of breath.  . butalbital-acetaminophen-caffeine (FIORICET, ESGIC) 50-325-40 MG tablet Take 1 tablet by mouth every 6 (six) hours as needed for headache.  . calcitonin, salmon, (MIACALCIN/FORTICAL) 200 UNIT/ACT nasal spray  Place 1 spray into alternate nostrils daily.  . Cholecalciferol 2000 UNITS CAPS Take 5,000 Units by mouth 2 (two) times daily.   Marland Kitchen CRANBERRY FRUIT PO Take 1 each by mouth every evening.   . Cyanocobalamin (VITAMIN B-12) 500 MCG SUBL 1 tab sl qd (Patient taking differently:  Take 500 tablets by mouth 2 (two) times daily. )  . fluticasone (FLONASE) 50 MCG/ACT nasal spray Place 1 spray into both nostrils daily as needed.  . Garlic 4481 MG CAPS Take 1,000 mg by mouth every evening.   . Multiple Vitamins-Minerals (MULTIVITAMIN WITH MINERALS) tablet Take 1 tablet by mouth every evening.   . nadolol (CORGARD) 40 MG tablet Take 0.5 tablets (20 mg total) by mouth 2 (two) times daily.  . potassium chloride (K-DUR) 10 MEQ tablet Take 1 tablet (10 mEq total) by mouth daily.  . Pyridoxine HCl (VITAMIN B-6 PO) Take 100 mcg by mouth 2 (two) times daily.   . ranitidine (ZANTAC) 150 MG tablet Take 75 mg by mouth 2 (two) times daily.  . TURMERIC PO Take 1 tablet by mouth 2 (two) times daily.   . [DISCONTINUED] doxycycline (VIBRA-TABS) 100 MG tablet Take 1 tablet (100 mg total) by mouth 2 (two) times daily. (Patient not taking: Reported on 12/23/2017)  . [DISCONTINUED] predniSONE (DELTASONE) 10 MG tablet Take 4 tabs once daily x3 days, then 3 tabs for 3 days, 2 tabs for 3 days, 1 tab for 3 days and stop. (Patient not taking: Reported on 12/23/2017)  . [EXPIRED] ipratropium-albuterol (DUONEB) 0.5-2.5 (3) MG/3ML nebulizer solution 3 mL    No facility-administered encounter medications on file as of 12/23/2017.      Review of Systems  Review of Systems  Constitutional: Negative.  Negative for chills and fever.  HENT: Positive for congestion. Negative for sinus pressure and sinus pain.   Respiratory: Positive for cough. Negative for shortness of breath.   Cardiovascular: Negative.   Gastrointestinal: Negative.   Allergic/Immunologic: Negative.   Neurological: Negative.   Psychiatric/Behavioral: Negative.        Physical Exam  BP 138/80 (BP Location: Left Arm, Patient Position: Sitting, Cuff Size: Normal)   Pulse 64   Ht 5\' 3"  (1.6 m)   Wt 104 lb (47.2 kg)   SpO2 98%   BMI 18.42 kg/m   Wt Readings from Last 5 Encounters:  12/23/17 104 lb (47.2 kg)  12/13/17 105 lb (47.6  kg)  11/21/17 104 lb 9.6 oz (47.4 kg)  09/22/17 106 lb (48.1 kg)  08/09/17 106 lb (48.1 kg)     Physical Exam  Constitutional: She is oriented to person, place, and time. She appears well-developed and well-nourished. No distress.  HENT:  White coating to tongue noted.   Cardiovascular: Normal rate and regular rhythm.  Pulmonary/Chest: Effort normal and breath sounds normal.  Neurological: She is alert and oriented to person, place, and time.  Psychiatric: She has a normal mood and affect.  Nursing note and vitals reviewed.    Lab Results:  CBC    Component Value Date/Time   WBC 10.4 12/13/2017 1415   RBC 4.55 12/13/2017 1415   HGB 13.6 12/13/2017 1415   HGB 12.0 12/02/2011 1111   HCT 40.9 12/13/2017 1415   HCT 36.0 12/02/2011 1111   PLT 242.0 12/13/2017 1415   PLT 170 12/02/2011 1111   MCV 89.8 12/13/2017 1415   MCV 92.3 12/02/2011 1111   MCH 30.3 01/27/2016 0507   MCHC 33.2 12/13/2017 1415   RDW 13.3 12/13/2017 1415  RDW 13.3 12/02/2011 1111   LYMPHSABS 3.1 12/13/2017 1415   LYMPHSABS 1.4 12/02/2011 1111   MONOABS 1.1 (H) 12/13/2017 1415   MONOABS 0.5 12/02/2011 1111   EOSABS 0.1 12/13/2017 1415   EOSABS 0.1 12/02/2011 1111   BASOSABS 0.1 12/13/2017 1415   BASOSABS 0.0 12/02/2011 1111    BMET    Component Value Date/Time   NA 141 12/13/2017 1415   K 3.3 (L) 12/13/2017 1415   CL 101 12/13/2017 1415   CO2 33 (H) 12/13/2017 1415   GLUCOSE 89 12/13/2017 1415   BUN 17 12/13/2017 1415   CREATININE 0.78 12/13/2017 1415   CALCIUM 9.9 12/13/2017 1415   GFRNONAA >60 01/27/2016 0507   GFRAA >60 01/27/2016 0507     Imaging: Dg Chest 2 View  Result Date: 12/23/2017 CLINICAL DATA:  Chest tightness and lung infection.  Bronchiectasis. EXAM: CHEST - 2 VIEW COMPARISON:  11/16/2017 FINDINGS: Airway thickening and linear densities in the lungs favoring the right upper lobe likely reflect airway plugging and bronchiectasis as seen on prior chest CT. Compared to  11/16/2017, the overall appearance is mildly improved although there is still some scattered nodularity and accentuation of markings especially in the right upper lobe. Cardiac and mediastinal margins appear normal.  No pleural effusion. IMPRESSION: 1. Mildly improved from 11/16/2017, although there is still some airway plugging, bronchiectasis, and faint nodularity characteristic of atypical infectious process. This is most notable in the right upper lobe. Electronically Signed   By: Van Clines M.D.   On: 12/23/2017 13:10     Assessment & Plan:   Bronchiectasis with (acute) exacerbation ([place]) Patient Instructions  Will order another round of doxycycline Will order nystatin for thrush Neb in office today Will order sputum cultures Will order chest x ray and call with results Follow up with Dr. Vaughan Browner as scheduled Call if symptoms worsen    Thrush Will order nystatin suspension      Fenton Foy, NP 12/26/2017

## 2017-12-23 NOTE — Telephone Encounter (Signed)
According to chart, medications were not called in. I called pt and advised her that I would send in the medications to [facility]. Pt understood and nothing further is needed.   All Notes   Patient Instructions by Fenton Foy, NP at 12/23/2017 12:00 PM  Author: Fenton Foy, NP Author Type: Nurse Practitioner Filed: 12/23/2017 12:41 PM  Note Status: Signed Cosign: Cosign Not Required Encounter Date: 12/23/2017  Editor: Fenton Foy, NP (Nurse Practitioner)    Will order another round of doxycycline Will order nystatin for thrush Neb in office today Will order sputum cultures Will order chest x ray and call with results Follow up with Dr. Vaughan Browner as scheduled Call if symptoms worsen

## 2017-12-23 NOTE — Patient Instructions (Signed)
Will order another round of doxycycline Will order nystatin for thrush Neb in office today Will order sputum cultures Will order chest x ray and call with results Follow up with Dr. Vaughan Browner as scheduled Call if symptoms worsen

## 2017-12-26 ENCOUNTER — Encounter: Payer: Self-pay | Admitting: Nurse Practitioner

## 2017-12-26 DIAGNOSIS — B370 Candidal stomatitis: Secondary | ICD-10-CM | POA: Insufficient documentation

## 2017-12-26 NOTE — Assessment & Plan Note (Signed)
Will order nystatin suspension

## 2017-12-26 NOTE — Assessment & Plan Note (Signed)
Patient Instructions  Will order another round of doxycycline Will order nystatin for thrush Neb in office today Will order sputum cultures Will order chest x ray and call with results Follow up with Dr. Vaughan Browner as scheduled Call if symptoms worsen

## 2017-12-27 ENCOUNTER — Telehealth: Payer: Self-pay | Admitting: Internal Medicine

## 2017-12-27 NOTE — Telephone Encounter (Signed)
AFB prelim 1+ in sputim

## 2017-12-28 NOTE — Telephone Encounter (Signed)
ATC- no answer with no option to leave vm.  Will call back 

## 2017-12-28 NOTE — Telephone Encounter (Signed)
Thanks for the update. 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  Lesleigh Noe- can you make sure she has follow up at next available in clinic.

## 2017-12-29 ENCOUNTER — Telehealth: Payer: Self-pay | Admitting: Pulmonary Disease

## 2017-12-29 DIAGNOSIS — R05 Cough: Secondary | ICD-10-CM

## 2017-12-29 DIAGNOSIS — R059 Cough, unspecified: Secondary | ICD-10-CM

## 2017-12-29 NOTE — Telephone Encounter (Signed)
Patient returned call, CB is [phone].

## 2017-12-29 NOTE — Telephone Encounter (Signed)
Attempted to contact pt. Received a fast busy signal x2. Will try back.  

## 2017-12-29 NOTE — Telephone Encounter (Signed)
Lindsay Foy, NP  Blankenship, Margie A, CMA        Please let patient know that her sputum was positive for bacteria. We need to get another sputum sample to verify this and see if there is any further treatment needed. Please order repeat sputum for regular cultures, AFB and fungus. Please schedule follow up with Dr. Vaughan Browner in 1 month.    lmtcb x1 for pt

## 2017-12-29 NOTE — Telephone Encounter (Deleted)
Fenton Foy, NP  Blankenship, Margie A, CMA        Please let patient know that her sputum was positive for bacteria. We need to get another sputum sample to verify this and see if there is any further treatment needed. Please order repeat sputum for regular cultures, AFB and fungus. Please schedule follow up with Dr. Vaughan Browner in 1 month.     lmtcb x1 for pt

## 2018-01-02 NOTE — Telephone Encounter (Signed)
LMTCB

## 2018-01-02 NOTE — Telephone Encounter (Signed)
Pt is calling back [phone] 

## 2018-01-02 NOTE — Telephone Encounter (Signed)
Pt is aware of below results and voiced her understanding.  Sputum cultures have been ordered. Sputum cups have been placed up front for pick up.  Pt has been scheduled for 25mo ROV with Dr. Vaughan Browner on 02/01/18. Nothing further is needed.

## 2018-01-04 ENCOUNTER — Telehealth: Payer: Self-pay | Admitting: Pulmonary Disease

## 2018-01-04 ENCOUNTER — Telehealth: Payer: Self-pay | Admitting: Nurse Practitioner

## 2018-01-04 NOTE — Telephone Encounter (Signed)
Called patient advised of results. Patient verbalized understanding nothing further needed.

## 2018-01-04 NOTE — Telephone Encounter (Signed)
Received call report from Jonelle Sidle w/ [facility]  AFB ordered by Kenney Houseman NP at the 12-23-17 office visit Partial sputum obtained from the patient on 12-23-17 @ 3:04pm Preliminary results are abnormal with AFB present in the liquid culture medium Identification to follow up  Will forward to [place]

## 2018-01-04 NOTE — Telephone Encounter (Signed)
partial sputum obtained 12/23/17 @ 3:04pm preliminary results abnormal with AFB present in the liquid culture medium identification to follow

## 2018-01-05 ENCOUNTER — Other Ambulatory Visit: Payer: Medicare Other

## 2018-01-05 DIAGNOSIS — R05 Cough: Secondary | ICD-10-CM

## 2018-01-05 DIAGNOSIS — R059 Cough, unspecified: Secondary | ICD-10-CM

## 2018-01-05 NOTE — Telephone Encounter (Signed)
Received call from [facility].  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  Will route to Lazaro Arms NP.

## 2018-01-05 NOTE — Telephone Encounter (Signed)
Patient wanted to inform us she dropped off sputum samples as well as ask if she could get a refill on the nystatin suspension.   TN please advise if okay to refill nystatin suspension.

## 2018-01-05 NOTE — Telephone Encounter (Signed)
Patient states she is returning a call from [place]. CB is [phone].

## 2018-01-06 MED ORDER — NYSTATIN 100000 UNIT/ML MT SUSP: 5.0000 mL | Freq: Four times a day (QID) | OROMUCOSAL | 0 refills | Status: DC

## 2018-01-06 NOTE — Telephone Encounter (Signed)
I sent refill for nystatin. Thank you.

## 2018-01-06 NOTE — Telephone Encounter (Signed)
Called the patient to let her know the prescription was sent to the pharmacy. Advised her the culture result has not been received yet. Patient wanted to know if she can take the rest of the cough syrup with codeine in it because she was coughing all night. I spoke with Lazaro Arms, NP to ask and she stated the patient can use the remainder of the cough syrup she has left to help. Patient made aware and I told her once the culture result has been received she will be contacted.

## 2018-01-09 ENCOUNTER — Telehealth: Payer: Self-pay | Admitting: Pulmonary Disease

## 2018-01-09 NOTE — Telephone Encounter (Signed)
Pt is aware of need to repeat sputum cultures.  Three sputum cups have been placed up front for pickup.  Pt voiced her  Understanding and had no further questions.  Nothing further is needed.

## 2018-01-09 NOTE — Telephone Encounter (Signed)
Spoke with Felicia in the [employer]. [facility] reached out to them and let them know that her sputum sample could not be used. Pt will have to repeat this.

## 2018-02-01 ENCOUNTER — Ambulatory Visit: Payer: Medicare Other | Admitting: Pulmonary Disease

## 2018-02-09 LAB — RESPIRATORY CULTURE OR RESPIRATORY AND SPUTUM CULTURE
MICRO NUMBER: 91068228
RESULT:: NORMAL
SPECIMEN QUALITY: ADEQUATE

## 2018-02-09 LAB — MYCOBACTERIA,CULT W/FLUOROCHROME SMEAR
MICRO NUMBER:: 91068227
SPECIMEN QUALITY:: ADEQUATE

## 2018-02-09 LAB — FUNGUS CULTURE W SMEAR
MICRO NUMBER:: 91068226
SMEAR: NONE SEEN
SPECIMEN QUALITY:: ADEQUATE

## 2018-02-10 ENCOUNTER — Encounter: Payer: Self-pay | Admitting: Nurse Practitioner

## 2018-02-10 ENCOUNTER — Other Ambulatory Visit: Payer: Self-pay | Admitting: Nurse Practitioner

## 2018-02-13 ENCOUNTER — Encounter: Payer: Self-pay | Admitting: Pulmonary Disease

## 2018-02-13 ENCOUNTER — Ambulatory Visit (INDEPENDENT_AMBULATORY_CARE_PROVIDER_SITE_OTHER): Payer: Medicare Other | Admitting: Pulmonary Disease

## 2018-02-13 VITALS — BP 128/84 | HR 62 | Ht 63.75 in | Wt 109.0 lb

## 2018-02-13 DIAGNOSIS — J471 Bronchiectasis with (acute) exacerbation: Secondary | ICD-10-CM

## 2018-02-13 NOTE — Patient Instructions (Addendum)
We have scheduled you for a bronchoscope on 02/21/2018 at [facility] Please do not eat anything after midnight and come to the reception at 8 AM Continue using the smart vest, albuterol as needed  For postnasal drip he can use Chlor-Trimeton up to 2 tablets 3 times daily, continue Flonase Follow-up in 2 months.

## 2018-02-13 NOTE — Progress Notes (Signed)
Lindsay Schultz    [id number]    27-May-1950  Primary Care Physician:Schultz, Lindsay Lacks, MD  Referring Physician: Cassandria Anger, MD [place], [place] [address]  Chief complaint:   Follow up for Asthmatic Bronchitis Bronchiectasis Sinusitis  Seasonal allergies GERD  HPI: Lindsay Schultz is a 67 year old female with history of asthmatic bronchitis, sinusitis, and seasonal allergies.  She's had a workup for her bronchiectasis including CBC, Ig levels, sputum cultures, A1AT levels, RF which were normal. PFTs do not show any obstruction and very mild restriction and reduction in diffusion capacity. Her husband has recently diagnosed with colon cancer s/p resection and chemo in 2017. [name] was stressed during all this but he is doing better now with cancer in remission.  She has attacks of bronchiectasis exacerbations about once a year. She states that the infection usually begins in the sinus and then moves into the lung. This usually resolves after course of antibiotics. She has H/O seasonal allergies (worst in fall and spring) and asthmatic bronchitis. Never smoked.   Interim history: She continues to have intermittent exacerbations with minor hemoptysis Sputum culture noted to be positive for MAI however repeat cultures are negative  States that cough is at baseline, nonproductive in nature.  She is normal no chest vest for clearance of secretion.  Outpatient Encounter Medications as of 02/13/2018  Medication Sig  . albuterol (PROVENTIL HFA;VENTOLIN HFA) 108 (90 Base) MCG/ACT inhaler Inhale 2 puffs into the lungs every 6 (six) hours as needed for wheezing or shortness of breath.  . butalbital-acetaminophen-caffeine (FIORICET, ESGIC) 50-325-40 MG tablet Take 1 tablet by mouth every 6 (six) hours as needed for headache.  . calcitonin, salmon, (MIACALCIN/FORTICAL) 200 UNIT/ACT nasal spray Place 1 spray into alternate nostrils daily.  . Cholecalciferol 2000 UNITS CAPS  Take 5,000 Units by mouth 2 (two) times daily.   Marland Kitchen CRANBERRY FRUIT PO Take 1 each by mouth every evening.   . Cyanocobalamin (VITAMIN B-12) 500 MCG SUBL 1 tab sl qd (Patient taking differently: Take 500 tablets by mouth 2 (two) times daily. )  . fluticasone (FLONASE) 50 MCG/ACT nasal spray Place 1 spray into both nostrils daily as needed.  . Garlic 6644 MG CAPS Take 1,000 mg by mouth every evening.   . Multiple Vitamins-Minerals (MULTIVITAMIN WITH MINERALS) tablet Take 1 tablet by mouth every evening.   . nadolol (CORGARD) 40 MG tablet Take 0.5 tablets (20 mg total) by mouth 2 (two) times daily.  . Pyridoxine HCl (VITAMIN B-6 PO) Take 100 mcg by mouth 2 (two) times daily.   . ranitidine (ZANTAC) 150 MG tablet Take 75 mg by mouth 2 (two) times daily.  . TURMERIC PO Take 1 tablet by mouth 2 (two) times daily.   . potassium chloride (K-DUR) 10 MEQ tablet Take 1 tablet (10 mEq total) by mouth daily. (Patient not taking: Reported on 02/13/2018)  . [DISCONTINUED] doxycycline (VIBRA-TABS) 100 MG tablet Take 1 tablet (100 mg total) by mouth 2 (two) times daily. (Patient not taking: Reported on 02/13/2018)  . [DISCONTINUED] doxycycline (VIBRA-TABS) 100 MG tablet Take 1 tablet (100 mg total) by mouth 2 (two) times daily. (Patient not taking: Reported on 02/13/2018)  . [DISCONTINUED] nystatin (MYCOSTATIN) 100000 UNIT/ML suspension Swish and swallow 45m four times daily x5-7days (Patient not taking: Reported on 02/13/2018)  . [DISCONTINUED] nystatin (MYCOSTATIN) 100000 UNIT/ML suspension Take 5 mLs (500,000 Units total) by mouth 4 (four) times daily. (Patient not taking: Reported on 02/13/2018)  No facility-administered encounter medications on file as of 02/13/2018.     Physical Exam: Blood pressure 128/84, pulse 62, height 5' 3.75" (1.619 m), weight 109 lb (49.4 kg), SpO2 97 %. Gen:      No acute distress HEENT:  EOMI, sclera anicteric Neck:     No masses; no thyromegaly Lungs:    Clear to  auscultation bilaterally; normal respiratory effort CV:         Regular rate and rhythm; no murmurs Abd:      + bowel sounds; soft, non-tender; no palpable masses, no distension Ext:    No edema; adequate peripheral perfusion Skin:      Warm and dry; no rash Neuro: alert and oriented x 3 Psych: normal mood and affect  Data Reviewed: Imaging CT scan 11/15/14-Multiple nodules in the left upper lobe, bilateral bronchiectasis. CT scan 01/24/16 Stable pulmonary nodules, bilateral bronchiectasis. Left upper lobe ground glass opacities. Images reviewed. CT scan 11/16/2017- stable pulmonary nodules, bilateral bronchiectasis, new area of opacity in the right upper lobe consistent with mucus impaction.  I have reviewed the images personally  Labs 12/09/14 IgG-773 IgG 8-2 37 IgM-64 IgE-56 Rheumatoid factor < 10 All within normal limits.  AFB and fungal cultures 11/19/14- no acid-fast bacilli, Candida albicans. 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 Mild reduction in lung volumes and diffusion capacity  Assessment:  Bronchiectasis, Intermittent Hemoptysis. Continues to have intermittent exacerbations Sputum culture shows MAI although this is nondiagnostic of active infection as repeat cultures are negative Given her ongoing symptoms we will schedule a bronchoscopy with BAL to get a better sample.  Continue chest percussion with vest for mucociliary clearance.  Abnormal CT scan with lung nodules Likely benign as most of them are remained stable.  Her last CT scan from 7/31 shows new area of opacity in the right upper lobe which is consistent with mucoid impaction. Follow-up CT scan in 6 months  Asthmatic  Bronchitis She is asymptomatic with symptoms mostly during spring and fall allergy. She'll continue the albuterol when necessary. Follow-up PFTs as  her last test was in 2016  GERD Currently on Zantac.  She has stopped her Protonix  Health maintenance 12/13/2016-Influenza 02/07/2016-Prevnar 05/28/2014-Pneumovax  Plan/Recommendations: - Schedule bronchoscopy - Continue albuterol as needed - Percussion vest - Follow-up CT in 6 months  Marshell Garfinkel MD  Pulmonary and Critical Care 02/13/2018, 11:46 AM  CC: Schultz, Lindsay Lacks, MD

## 2018-02-14 ENCOUNTER — Telehealth: Payer: Self-pay | Admitting: Pulmonary Disease

## 2018-02-14 NOTE — Telephone Encounter (Signed)
Spoke with pt, advised her that Lindsay Schultz/Lindsay Schultz called her to talk to her about her sputum culture results. She was seen by Dr. Vaughan Browner yesterday and discussed results and plan for bronchoscopy scheduled for 02/21/18. Pt understood and nothing further is needed.

## 2018-02-21 ENCOUNTER — Ambulatory Visit (HOSPITAL_COMMUNITY)
Admission: RE | Admit: 2018-02-21 | Discharge: 2018-02-21 | Disposition: A | Discharge status: Home / Self Care | Payer: Medicare Other | Source: Ambulatory Visit | Attending: Pulmonary Disease | Admitting: Pulmonary Disease

## 2018-02-21 ENCOUNTER — Encounter (HOSPITAL_COMMUNITY)
Admission: RE | Disposition: A | Discharge status: Home / Self Care | Payer: Self-pay | Source: Ambulatory Visit | Attending: Pulmonary Disease

## 2018-02-21 ENCOUNTER — Ambulatory Visit (HOSPITAL_COMMUNITY): Payer: Medicare Other

## 2018-02-21 ENCOUNTER — Encounter (HOSPITAL_COMMUNITY): Payer: Self-pay | Admitting: Respiratory Therapy

## 2018-02-21 DIAGNOSIS — J45909 Unspecified asthma, uncomplicated: Secondary | ICD-10-CM | POA: Diagnosis not present

## 2018-02-21 DIAGNOSIS — J479 Bronchiectasis, uncomplicated: Secondary | ICD-10-CM

## 2018-02-21 DIAGNOSIS — J329 Chronic sinusitis, unspecified: Secondary | ICD-10-CM | POA: Insufficient documentation

## 2018-02-21 DIAGNOSIS — K219 Gastro-esophageal reflux disease without esophagitis: Secondary | ICD-10-CM | POA: Insufficient documentation

## 2018-02-21 DIAGNOSIS — Z9889 Other specified postprocedural states: Secondary | ICD-10-CM

## 2018-02-21 DIAGNOSIS — Z79899 Other long term (current) drug therapy: Secondary | ICD-10-CM | POA: Insufficient documentation

## 2018-02-21 HISTORY — PX: VIDEO BRONCHOSCOPY: SHX5072

## 2018-02-21 LAB — BODY FLUID CELL COUNT WITH DIFFERENTIAL
EOS FL: 1 %
LYMPHS FL: 9 %
MONOCYTE-MACROPHAGE-SEROUS FLUID: 14 % — AB (ref 50–90)
Neutrophil Count, Fluid: 76 % — ABNORMAL HIGH (ref 0–25)
Total Nucleated Cell Count, Fluid: 635 cu mm (ref 0–1000)

## 2018-02-21 SURGERY — VIDEO BRONCHOSCOPY WITHOUT FLUORO
Anesthesia: Moderate Sedation | Laterality: Bilateral

## 2018-02-21 MED ORDER — LIDOCAINE HCL (PF) 1 % IJ SOLN
INTRAMUSCULAR | Status: DC | PRN
  Administered 2018-02-21: 6 mL

## 2018-02-21 MED ORDER — MIDAZOLAM HCL 10 MG/2ML IJ SOLN
INTRAMUSCULAR | Status: DC | PRN
  Administered 2018-02-21 (×3): 1 mg via INTRAVENOUS

## 2018-02-21 MED ORDER — PHENYLEPHRINE HCL 0.25 % NA SOLN
NASAL | Status: DC | PRN
  Administered 2018-02-21: 2 via NASAL

## 2018-02-21 MED ORDER — SODIUM CHLORIDE 0.9 % IV SOLN
Freq: Once | INTRAVENOUS | Status: AC
  Administered 2018-02-21: 09:00:00 via INTRAVENOUS

## 2018-02-21 MED ORDER — ONDANSETRON HCL 4 MG/2ML IJ SOLN
INTRAMUSCULAR | Status: AC
  Filled 2018-02-21: qty 2

## 2018-02-21 MED ORDER — ONDANSETRON HCL 4 MG/2ML IJ SOLN
4.0000 mg | Freq: Once | INTRAMUSCULAR | Status: AC
  Administered 2018-02-21: 4 mg via INTRAVENOUS

## 2018-02-21 MED ORDER — FENTANYL CITRATE (PF) 100 MCG/2ML IJ SOLN
INTRAMUSCULAR | Status: DC | PRN
  Administered 2018-02-21 (×2): 25 ug via INTRAVENOUS

## 2018-02-21 MED ORDER — MIDAZOLAM HCL 5 MG/ML IJ SOLN
INTRAMUSCULAR | Status: AC
  Filled 2018-02-21: qty 2

## 2018-02-21 MED ORDER — BUTAMBEN-TETRACAINE-BENZOCAINE 2-2-14 % EX AERO: 1.0000 | INHALATION_SPRAY | Freq: Once | CUTANEOUS | Status: DC

## 2018-02-21 MED ORDER — LIDOCAINE HCL URETHRAL/MUCOSAL 2 % EX GEL
CUTANEOUS | Status: DC | PRN
  Administered 2018-02-21: 1

## 2018-02-21 MED ORDER — FENTANYL CITRATE (PF) 100 MCG/2ML IJ SOLN
INTRAMUSCULAR | Status: AC
  Filled 2018-02-21: qty 4

## 2018-02-21 MED ORDER — LIDOCAINE HCL 2 % EX GEL: 1.0000 "application " | Freq: Once | CUTANEOUS | Status: DC

## 2018-02-21 MED ORDER — PHENYLEPHRINE HCL 0.25 % NA SOLN: 1.0000 | Freq: Four times a day (QID) | NASAL | Status: DC | PRN

## 2018-02-21 NOTE — Progress Notes (Signed)
Video bronchoscopy performed.  Intervention bronchial washing.   No complications noted.  Will continue to monitor. 

## 2018-02-21 NOTE — Op Note (Signed)
[facility] Cardiopulmonary Patient Name: Lindsay Schultz Date: 02/21/2018 MRN: [id number] Attending MD: Marshell Garfinkel , MD Date of Birth: [birth year] CSN: Finalized Age: 67 Admit Type: Ambulatory Gender: Female Procedure:            Bronchoscopy Indications:          Bronchiectasis, R/O MAI Providers:            Marshell Garfinkel, MD, Phillis Knack RRT, RCP, Ciro Backer                        RRT, RCP Referring MD:          Medicines:             Complications:        No immediate complications Estimated Blood Loss: Estimated blood loss: none. Procedure:            Pre-Anesthesia Assessment:                       - A History and Physical has been performed. Patient                        meds and allergies have been reviewed. The risks and                        benefits of the procedure and the sedation options and                        risks were discussed with the patient. All questions                        were answered and informed consent was obtained.                        Patient identification and proposed procedure were                        verified prior to the procedure by the physician in the                        procedure room. Mental Status Examination: alert and                        oriented. Airway Examination: normal oropharyngeal                        airway. Respiratory Examination: clear to auscultation.                        CV Examination: RRR, no murmurs, no S3 or S4. ASA Grade                        Assessment: II - A patient with mild systemic disease.                        After reviewing the risks and benefits, the patient was                        deemed in satisfactory condition to undergo the  procedure. The anesthesia plan was to use moderate                        sedation / analgesia (conscious sedation). Immediately                        prior to administration of medications, the patient was                     re-assessed for adequacy to receive sedatives. The                        heart rate, respiratory rate, oxygen saturations, blood                        pressure, adequacy of pulmonary ventilation, and                        response to care were monitored throughout the                        procedure. The physical status of the patient was                        re-assessed after the procedure.                       After obtaining informed consent, the bronchoscope was                        passed under direct vision. Throughout the procedure,                        the patient's blood pressure, pulse, and oxygen                        saturations were monitored continuously. the BF-H190                        ([id number]) Olympus Diagnostic Bronchoscope was                        introduced through the left nostril and advanced to the                        tracheobronchial tree of both lungs. Scope In: 8:57:30 AM Scope Out: 9:05:39 AM Findings:      The nasopharynx/oropharynx appears normal. The larynx appears normal.       The vocal cords appear normal. The subglottic space was slightly narrow       in the posterior area (picture, arrow) but this narrowing does not       appear to be clinically significant. The carina is sharp. The       tracheobronchial tree was examined to at least the first subsegmental       level. Bronchial mucosa and anatomy are normal; there are no       endobronchial lesions, and mucoid secretions noted in bilateral lungs.      Bronchoalveolar lavage was performed in the RUL anterior segment (B3) of       the lung at the area of the most seceretions and sent  for cell count,       bacterial culture, viral smears & culture, and fungal & AFB analysis and       cytology. 180 mL of fluid were instilled. 100 mL were returned. The       return was mucoid. There were no mucoid plugs in the return fluid.       Multiple specimens were obtained and  pooled into one specimen, which was       sent for analysis. Impression:           - Bronchiectasis                       - Slight narrowing of subglottic area, does not appear                        to be clinically significant                       - Bronchoalveolar lavage was performed. Moderate Sedation:      Moderate (conscious) sedation was administered by the endoscopy nurse       and supervised by the endoscopist. The following parameters were       monitored: oxygen saturation, heart rate, blood pressure, and response       to care. Total physician intraservice time was 20 minutes. Recommendation:       - Await BAL, culture and cytology results. Procedure Code(s):    --- Professional ---                       [id number], Bronchoscopy, rigid or flexible, including                        fluoroscopic guidance, when performed; with bronchial                        alveolar lavage                       99152, Moderate sedation services provided by the same                        physician or other qualified health care professional                        performing the diagnostic or therapeutic service that                        the sedation supports, requiring the presence of an                        independent trained observer to assist in the                        monitoring of the patient's level of consciousness and                        physiological status; initial 15 minutes of                        intraservice time, patient age 21 years or older Diagnosis Code(s):    --- Professional ---  J47.9, Bronchiectasis, uncomplicated CPT copyright 2018 American Medical Association. All rights reserved. The codes documented in this report are preliminary and upon coder review may  be revised to meet current compliance requirements. Marshell Garfinkel, MD 02/21/2018 9:21:57 AM Number of Addenda: 0

## 2018-02-21 NOTE — Discharge Instructions (Signed)
Flexible Bronchoscopy, Care After These instructions give you information on caring for yourself after your procedure. Your doctor may also give you more specific instructions. Call your doctor if you have any problems or questions after your procedure. Follow these instructions at home:  Do not eat or drink anything for 2 hours after your procedure. If you try to eat or drink before the medicine wears off, food or drink could go into your lungs. You could also burn yourself.  After 2 hours have passed and when you can cough and gag normally, you may eat soft food and drink liquids slowly.  The day after the test, you may eat your normal diet.  You may do your normal activities.  Keep all doctor visits. Get help right away if:  You get more and more short of breath.  You get light-headed.  You feel like you are going to pass out (faint).  You have chest pain.  You have new problems that worry you.  You cough up more than a little blood.  You cough up more blood than before.  Do not eat or drink anything until 11:00 am on 02/21/2018. This information is not intended to replace advice given to you by your health care provider. Make sure you discuss any questions you have with your health care provider. Document Released: 01/31/2009 Document Revised: 09/11/2015 Document Reviewed: 12/08/2012 Elsevier Interactive Patient Education  2017 [employer].

## 2018-02-22 ENCOUNTER — Encounter (HOSPITAL_COMMUNITY): Payer: Self-pay | Admitting: Pulmonary Disease

## 2018-02-23 LAB — CULTURE, BAL-QUANTITATIVE: CULTURE: NORMAL

## 2018-02-23 LAB — CULTURE, BAL-QUANTITATIVE W GRAM STAIN

## 2018-02-23 LAB — MYCOBACTERIA,CULT W/FLUOROCHROME SMEAR
MICRO NUMBER:: 91126659
SMEAR: NONE SEEN
SPECIMEN QUALITY:: ADEQUATE

## 2018-02-23 LAB — RESPIRATORY CULTURE OR RESPIRATORY AND SPUTUM CULTURE

## 2018-02-23 LAB — FUNGUS CULTURE W SMEAR
MICRO NUMBER:: 91126658
SMEAR:: NONE SEEN
SPECIMEN QUALITY:: ADEQUATE

## 2018-02-23 LAB — EXTRA

## 2018-03-08 ENCOUNTER — Telehealth: Payer: Self-pay | Admitting: Pulmonary Disease

## 2018-03-08 DIAGNOSIS — A310 Pulmonary mycobacterial infection: Secondary | ICD-10-CM

## 2018-03-08 NOTE — Telephone Encounter (Signed)
Call and spoke to patient, requesting results of Bronch.   PM please advise of Bronch results.

## 2018-03-09 NOTE — Telephone Encounter (Signed)
Let patient know that results of far show no evidence of infection or MAI All tests are normal so far.  We are still waiting for the final cultures which may take several more weeks to come back.  We will update her if it is abnormal.

## 2018-03-09 NOTE — Telephone Encounter (Signed)
lmtcb x1 for pt. 

## 2018-03-09 NOTE — Telephone Encounter (Signed)
Will route to myself for f/u, as encounter has been closed.

## 2018-03-09 NOTE — Telephone Encounter (Signed)
Spoke with pt. She is aware of her results. Nothing further was needed at this time. 

## 2018-03-09 NOTE — Telephone Encounter (Signed)
Can you call the patient back that the results just updated today and the cultures are now growing MAC Make referral to ID

## 2018-03-09 NOTE — Telephone Encounter (Signed)
Pt is calling back [phone]

## 2018-03-10 ENCOUNTER — Telehealth: Payer: Self-pay | Admitting: Pulmonary Disease

## 2018-03-10 NOTE — Telephone Encounter (Signed)
Spoke with pt and advised her that she spoke with Margie earlier and I didn't see any correspondence of anything else that needed to be addressed. She understood and nothing further is needed.

## 2018-03-10 NOTE — Telephone Encounter (Signed)
Pt is aware of results and voiced her understanding.  Referral has been placed to ID.  Nothing further is needed at this time.

## 2018-03-10 NOTE — Telephone Encounter (Signed)
ATC pt, no answer. Left message for pt to call back.  

## 2018-03-10 NOTE — Telephone Encounter (Signed)
Pt is calling back [phone]

## 2018-03-10 NOTE — Addendum Note (Signed)
Addended by: Maryanna Shape A on: 03/10/2018 11:07 AM   Modules accepted: Orders

## 2018-03-14 ENCOUNTER — Encounter: Payer: Self-pay | Admitting: Internal Medicine

## 2018-03-14 ENCOUNTER — Ambulatory Visit (INDEPENDENT_AMBULATORY_CARE_PROVIDER_SITE_OTHER): Payer: Medicare Other | Admitting: Internal Medicine

## 2018-03-14 VITALS — BP 149/87 | HR 67 | Temp 98.0°F | Ht 63.0 in | Wt 103.0 lb

## 2018-03-14 DIAGNOSIS — J471 Bronchiectasis with (acute) exacerbation: Secondary | ICD-10-CM | POA: Diagnosis not present

## 2018-03-14 DIAGNOSIS — R110 Nausea: Secondary | ICD-10-CM | POA: Diagnosis not present

## 2018-03-14 MED ORDER — ETHAMBUTOL HCL 400 MG PO TABS: 800.0000 mg | ORAL_TABLET | ORAL | 11 refills | Status: DC

## 2018-03-14 MED ORDER — RIFAMPIN 300 MG PO CAPS: 600.0000 mg | ORAL_CAPSULE | ORAL | 11 refills | Status: DC

## 2018-03-14 MED ORDER — ETHAMBUTOL HCL 400 MG PO TABS: 1200.0000 mg | ORAL_TABLET | ORAL | 11 refills | Status: DC

## 2018-03-14 MED ORDER — AZITHROMYCIN 500 MG PO TABS: 500.0000 mg | ORAL_TABLET | ORAL | 11 refills | Status: DC

## 2018-03-14 MED ORDER — ONDANSETRON 4 MG PO TBDP: 4.0000 mg | ORAL_TABLET | Freq: Three times a day (TID) | ORAL | 5 refills | Status: DC | PRN

## 2018-03-14 NOTE — Patient Instructions (Signed)
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  What are nontuberculous mycobacteria? Nontuberculous mycobacteria (abbreviated NTM) are germs that are related to tuberculosis but that live in the environment, primarily in the soil and water. The difference between NTM and tuberculosis is that NTM are not spread by person to person contact and are not considered to be contagious.  NTM infection is acquired from the environment. We do not know how or why people become infected with NTM germs. Although we can easily recover the germ from soil, water, and air samples, most people do not become sick from this organism. Scientists and physicians who study these germs think that perhaps the people who become infected have some defect in the structure or function of their lungs or in their immune systems. People who have damaged lung tissue from previous tuberculosis, heavy smoking, and a breathing tube disease called bronchiectasis, are three recognized risk factors which make you more likely to develop this disease. Disease in men most commonly relates to heavy smoking, while disease in women most commonly relates to bronchiectasis.   What are the names of some common nontuberculous mycobacteria? The most common NTM germs involved in human infection are Mycobacterium avium complex (MAC), M. kansasii, M. chelonae, and M. Abscessus.  Can the infection spread outside my lungs? In general, the infection does not spread outside the lungs in persons with an otherwise normal immune system. People with a severely compromised immune system, such as persons with HIV infection, persons who are on medications that suppress the immune system (such as prednisone), or persons who have had organ or bone marrow transplants may develop disease outside the lungs.  Can this infection be cured? NTM infections in the lung are hard to  treat, requiring multiple antibiotics for a long period of time (usually at least a year). Treatment may consist of pills along with intravenous antibiotics. Even with the best available treatments, the cure rate for NTM lung disease is often low (for example, about 55-65% for MAC lung disease). Also, a person can become reinfected after treatment is completed.  Will this infection kill me? In general, NTM lung infections are slow-moving; most people will not die from the infection, but will die with the infection. Some patients can have these infections for decades and live relatively healthy and productive lives.  What can I do besides take antibiotics to help my body with this infection? There are a number of things you can do to keep yourself healthy. Regular aerobic exercise (brisk walking, bicycling, swimming, etc.) is really important for overall health and to optimize lung function. Doing these activities for at least 30 minutes at least 3 (preferably 5 or more) days per week will help you stay healthy. Getting a flu shot every year is also important, and you should get the pneumonia shot (Pneumovax) at least once. Don't smoke, and stay away from people who are smoking. Wash your hands regularly or use hand sanitizer after being around crowds or in public places.   More information available at [email or web address] from [facility].

## 2018-03-14 NOTE — Progress Notes (Signed)
[facility]      Reason for Consult: Bronchiectasis with positive MAI culture    Referring Physician: Dr. Vaughan Browner    Patient ID: Lindsay Schultz, female    DOB: 10-23-1950, 67 y.o.   MRN: [id number]  HPI:   Here for evaluation of above.  She has had a history of bronchiectatic findings on CT since 2016 and followed by pulmonary for this since that time and treated with inhalers.  Also with some nodules noted.  Has had exacerbations about once a year.  Recently with some hemoptysis and sputum, and now BAL sample with MAI.  Sensitivities noted.  Never treated for MAI.  Has had a rash to clarithro and azithromycin.  Over the last 3 months she has noted more fatigue, cough, congestion though improved with the vest.  Some weight loss of 12 lbs approximately.   CT and CXRs independently reviewed and area of concern noted. Previous record reviewed in Epic including sensitivities.  I note rifampin MICs.    Past Medical History:  Diagnosis Date  . Allergic rhinitis   . Benign paroxysmal positional vertigo 06/14/2015  . Breast cancer ([place]) 2008  . Bronchiectasis ([place])   . GERD (gastroesophageal reflux disease)   . HTN (hypertension)   . Hx of breast cancer   . Osteopenia   . Personal history of radiation therapy 2008    Prior to Admission medications   Medication Sig Start Date End Date Taking? Authorizing Provider  acetaminophen (TYLENOL) 500 MG tablet Take 500-1,000 mg by mouth every 6 (six) hours as needed (for pain.).    [provider]  albuterol (PROVENTIL HFA;VENTOLIN HFA) 108 (90 Base) MCG/ACT inhaler Inhale 2 puffs into the lungs every 6 (six) hours as needed for wheezing or shortness of breath. 11/21/17   Mannam, Hart Robinsons, MD  calcitonin, salmon, (MIACALCIN/FORTICAL) 200 UNIT/ACT nasal spray Place 1 spray into alternate nostrils daily. 12/13/17   Plotnikov, Evie Lacks, MD  Carboxymethylcellulose Sodium (THERATEARS) 0.25 % SOLN Place 1 drop into both eyes 3  (three) times daily as needed (for dry/irritated eyes.).    [provider]  cholecalciferol (VITAMIN D) 1000 units tablet Take 1,000 Units by mouth 2 (two) times daily.    [provider]  CRANBERRY PO Take 1 capsule by mouth every evening.    [provider]  Cyanocobalamin (VITAMIN B-12) 500 MCG SUBL 1 tab sl qd Patient taking differently: Take 500 mcg by mouth 2 (two) times daily.  08/23/12   Plotnikov, Evie Lacks, MD  famotidine (PEPCID) 20 MG tablet Take 20 mg by mouth daily.    [provider]  fluticasone (FLONASE) 50 MCG/ACT nasal spray Place 1 spray into both nostrils daily as needed. Patient taking differently: Place 1 spray into both nostrils daily.  12/13/17   Plotnikov, Evie Lacks, MD  Lactobacillus (ACIDOPHILUS PO) Take 1 capsule by mouth every evening.    [provider]  Multiple Vitamins-Minerals (MULTIVITAMIN WITH MINERALS) tablet Take 1 tablet by mouth every evening.     [provider]  nadolol (CORGARD) 40 MG tablet Take 0.5 tablets (20 mg total) by mouth 2 (two) times daily. 12/13/17   Plotnikov, Evie Lacks, MD  potassium chloride (K-DUR) 10 MEQ tablet Take 1 tablet (10 mEq total) by mouth daily. Patient not taking: Reported on 02/13/2018 12/14/17   Plotnikov, Evie Lacks, MD  pyridOXINE (VITAMIN B-6) 100 MG tablet Take 100 mg by mouth 2 (two) times daily.    [provider]    Allergies  Allergen Reactions  . Meloxicam Nausea And Vomiting    Upset stomach  . Paroxetine Other (See Comments)    Passed out  . Penicillins Other (See Comments)    passed out after a shot at 67 y.o  Has patient had a PCN reaction causing immediate rash, facial/tongue/throat swelling, SOB or lightheadedness with hypotension: no Has patient had a PCN reaction causing severe rash involving mucus membranes or skin necrosis:  no Has patient had a PCN reaction that required hospitalization: no Has patient had a PCN reaction occurring within  the last 10 years: no If all of the above answers are "NO", then may proceed with Cephalosporin use.   . Ciprofloxacin Rash     rash along the vein w/IV Cipro  . Clarithromycin Rash  . Zithromax [Azithromycin] Rash    Social History   Tobacco Use  . Smoking status: Never Smoker  . Smokeless tobacco: Never Used  Substance Use Topics  . Alcohol use: No    Alcohol/week: 0.0 standard drinks  . Drug use: No    Family History  Problem Relation Age of Onset  . Sjogren's syndrome Mother   . Arthritis Father 80       RA  . Emphysema Father   . Heart disease Father   . Hypertension Other   . Hodgkin's lymphoma Brother     Review of Systems  Constitutional: positive for fatigue, malaise and weight loss or negative for fevers, chills and sweats Respiratory: positive for cough, sputum or dyspnea on exertion, negative for hemoptysis Integument/breast: negative for rash Musculoskeletal: negative for myalgias and arthralgias All other systems reviewed and are negative    Constitutional: in no apparent distress  Vitals:   03/14/18 1052  Weight: 103 lb (46.7 kg)  Height: _0  (1.6 m)   EYES: anicteric ENMT: no thrush Cardiovascular: Cor RRR Respiratory: distant, CTA B; normal respiratory effort Musculoskeletal: no pedal edema noted Skin: negatives: no rash Neuro: non focal  Labs: Lab Results  Component Value Date   WBC 10.4 12/13/2017   HGB 13.6 12/13/2017   HCT 40.9 12/13/2017   MCV 89.8 12/13/2017   PLT 242.0 12/13/2017    Lab Results  Component Value Date   CREATININE 0.78 12/13/2017   BUN 17 12/13/2017   NA 141 12/13/2017   K 3.3 (L) 12/13/2017   CL 101 12/13/2017   CO2 33 (H) 12/13/2017    Lab Results  Component Value Date   ALT 15 12/13/2017   AST 16 12/13/2017   ALKPHOS 96 12/13/2017   BILITOT 0.5 12/13/2017   INR 1.05 01/26/2016     Assessment: MAI pulmonary non-cavitary bronchiectasis.  I discussed treatment considerations including worsening  pulmonary status, increased congestion and treatment options.  Though she did have a rash once from azithromycin, no hives and she had tolerated it well previously.  I discussed this as being the best option for a backbone of the therapy and willing to start and let us know if she develops a rash and the benefits outweigh the risks. Rifampin is resistant per sensitivities but clinically is not known to correlate with lack of treatment effectiveness.    Plan: 1) azithromcyin 500 mg, ethambutole 800 mg MWF 2) rifampin 600 mg TTHSat 3) CMP today 4) call for any rash, concerns 5) f/u in 2 weeks and will check side effects, hepatic panel

## 2018-03-14 NOTE — Progress Notes (Signed)
HPI: Lindsay Schultz is a 67 y.o. female presenting for management of pulmonary MAI.  Patient Active Problem List   Diagnosis Date Noted  . Bronchiectasis without complication ([place])   . Thrush 12/26/2017  . Abdominal pain 08/09/2017  . Ear pain, right 06/08/2016  . Leukocytosis 01/27/2016  . Hemoptysis 01/24/2016  . Bronchiectasis with (acute) exacerbation ([place]) 01/12/2016  . Well adult exam 11/07/2015  . Benign paroxysmal positional vertigo 06/14/2015  . Rash and nonspecific skin eruption 10/30/2014  . Pain in both feet 09/26/2012  . Bilateral hand pain 09/26/2012  . B12 deficiency 08/23/2012  . Paresthesia and pain of extremity 08/22/2012  . LBP radiating to right leg 03/27/2012  . Herpes zoster infection 03/27/2012  . Need for prophylactic vaccination and inoculation against other viral diseases(V04.89) 02/15/2012  . Trigeminal neuralgia pain 01/19/2012  . Asthmatic bronchitis 07/27/2011  . Sinusitis, acute 02/19/2011  . Skin rash 02/19/2011  . NEOPLASM OF UNCERTAIN BEHAVIOR OF SKIN 12/12/2009  . CERUMEN IMPACTION 12/12/2009  . SHOULDER PAIN 01/29/2009  . Headache(784.0) 12/11/2008  . BRONCHITIS, ACUTE 11/06/2008  . Essential hypertension 06/12/2007  . Allergic rhinitis 06/12/2007  . GERD (gastroesophageal reflux disease) 06/12/2007  . Disorder of bone and cartilage 06/12/2007  . BREAST CANCER, HX OF 06/12/2007    Patient's Medications  New Prescriptions   AZITHROMYCIN (ZITHROMAX) 500 MG TABLET    Take 1 tablet (500 mg total) by mouth 3 (three) times a week.   ETHAMBUTOL (MYAMBUTOL) 400 MG TABLET    Take 3 tablets (1,200 mg total) by mouth 3 (three) times a week.   RIFAMPIN (RIFADIN) 300 MG CAPSULE    Take 2 capsules (600 mg total) by mouth 3 (three) times a week.  Previous Medications   ACETAMINOPHEN (TYLENOL) 500 MG TABLET    Take 500-1,000 mg by mouth every 6 (six) hours as needed (for pain.).   ALBUTEROL (PROVENTIL HFA;VENTOLIN HFA) 108 (90 BASE) MCG/ACT INHALER     Inhale 2 puffs into the lungs every 6 (six) hours as needed for wheezing or shortness of breath.   CALCITONIN, SALMON, (MIACALCIN/FORTICAL) 200 UNIT/ACT NASAL SPRAY    Place 1 spray into alternate nostrils daily.   CARBOXYMETHYLCELLULOSE SODIUM (THERATEARS) 0.25 % SOLN    Place 1 drop into both eyes 3 (three) times daily as needed (for dry/irritated eyes.).   CHOLECALCIFEROL (VITAMIN D) 1000 UNITS TABLET    Take 1,000 Units by mouth 2 (two) times daily.   CRANBERRY PO    Take 1 capsule by mouth every evening.   CYANOCOBALAMIN (VITAMIN B-12) 500 MCG SUBL    1 tab sl qd   FAMOTIDINE (PEPCID) 20 MG TABLET    Take 20 mg by mouth daily.   FLUTICASONE (FLONASE) 50 MCG/ACT NASAL SPRAY    Place 1 spray into both nostrils daily as needed.   LACTOBACILLUS (ACIDOPHILUS PO)    Take 1 capsule by mouth every evening.   MULTIPLE VITAMINS-MINERALS (MULTIVITAMIN WITH MINERALS) TABLET    Take 1 tablet by mouth every evening.    NADOLOL (CORGARD) 40 MG TABLET    Take 0.5 tablets (20 mg total) by mouth 2 (two) times daily.   PYRIDOXINE (VITAMIN B-6) 100 MG TABLET    Take 100 mg by mouth 2 (two) times daily.  Modified Medications   No medications on file  Discontinued Medications   POTASSIUM CHLORIDE (K-DUR) 10 MEQ TABLET    Take 1 tablet (10 mEq total) by mouth daily.    Allergies: Allergies  Allergen  Reactions  . Meloxicam Nausea And Vomiting    Upset stomach  . Paroxetine Other (See Comments)    Passed out  . Penicillins Other (See Comments)    passed out after a shot at 67 y.o  Has patient had a PCN reaction causing immediate rash, facial/tongue/throat swelling, SOB or lightheadedness with hypotension: no Has patient had a PCN reaction causing severe rash involving mucus membranes or skin necrosis:  no Has patient had a PCN reaction that required hospitalization: no Has patient had a PCN reaction occurring within the last 10 years: no If all of the above answers are "NO", then may proceed with  Cephalosporin use.   . Ciprofloxacin Rash     rash along the vein w/IV Cipro  . Clarithromycin Rash  . Zithromax [Azithromycin] Rash    Past Medical History: Past Medical History:  Diagnosis Date  . Allergic rhinitis   . Benign paroxysmal positional vertigo 06/14/2015  . Breast cancer ([place]) 2008  . Bronchiectasis ([place])   . GERD (gastroesophageal reflux disease)   . HTN (hypertension)   . Hx of breast cancer   . Osteopenia   . Personal history of radiation therapy 2008    Social History: Social History   Socioeconomic History  . Marital status: Married    Spouse name: Not on file  . Number of children: Not on file  . Years of education: Not on file  . Highest education level: Not on file  Occupational History  . Occupation: Retired    Comment: Orthoptist  Social Needs  . Financial resource strain: Not on file  . Food insecurity:    Worry: Not on file    Inability: Not on file  . Transportation needs:    Medical: Not on file    Non-medical: Not on file  Tobacco Use  . Smoking status: Never Smoker  . Smokeless tobacco: Never Used  Substance and Sexual Activity  . Alcohol use: No    Alcohol/week: 0.0 standard drinks  . Drug use: No  . Sexual activity: Yes  Lifestyle  . Physical activity:    Days per week: Not on file    Minutes per session: Not on file  . Stress: Not on file  Relationships  . Social connections:    Talks on phone: Not on file    Gets together: Not on file    Attends religious service: Not on file    Active member of club or organization: Not on file    Attends meetings of clubs or organizations: Not on file    Relationship status: Not on file  Other Topics Concern  . Not on file  Social History Narrative   Regular Exercise- yes   Assessment/Plan: Lindsay Schultz is here today to start treatment for pulmonary MAI. She is to start taking azithromycin, ethambutol, and rifampin three times weekly. She does have a remote history of rash with  azithromycin but is willing to try it again. We discussed taking azithromycin and ethambutol together on MWF and rifampin alone on TTSa to help decrease pill burden and GI upset. We provided her with pill boxes to help keep track of which medicines to take on what days. Enforced that rifampin must be taken on an empty stomach and that she should not eat for 1-2 hours after taking it. We reviewed common side effects of this regimen, including nausea/vomiting/diarrhea, red-tinged body fluids with rifampin, and vision changes with ethambutol. We discussed using Zofran to help with nausea and vomiting  if needed. We emphasized the importance of letting us know of any new medications or supplements she starts taking because of the many drug interactions with rifampin. The medications she is currently taking do not interact. She is also wondering about appetite stimulant medications, as she seldom wants to eat and has lost ~10 pounds. We encouraged her to see if this gets any better once she starts taking these medications and if not, she can address this issue with Dr. Linus Salmons at her next follow up appointment. Encouraged her to call us with any questions or issues that may arise.  Mila Merry Gerarda Fraction, PharmD PGY2 Infectious Diseases Pharmacy Resident Phone: [phone] 03/14/2018, 12:05 PM

## 2018-03-27 ENCOUNTER — Encounter: Payer: Self-pay | Admitting: Internal Medicine

## 2018-03-27 ENCOUNTER — Other Ambulatory Visit: Payer: Self-pay | Admitting: Internal Medicine

## 2018-03-27 ENCOUNTER — Ambulatory Visit (INDEPENDENT_AMBULATORY_CARE_PROVIDER_SITE_OTHER): Payer: Medicare Other | Admitting: Internal Medicine

## 2018-03-27 DIAGNOSIS — A310 Pulmonary mycobacterial infection: Secondary | ICD-10-CM

## 2018-03-27 DIAGNOSIS — Z5181 Encounter for therapeutic drug level monitoring: Secondary | ICD-10-CM | POA: Diagnosis not present

## 2018-03-27 MED ORDER — PANTOPRAZOLE SODIUM 40 MG PO TBEC: 40.0000 mg | DELAYED_RELEASE_TABLET | Freq: Every day | ORAL | 11 refills | Status: DC

## 2018-03-27 NOTE — Progress Notes (Signed)
   Subjective:    Patient ID: Lindsay Schultz, female    DOB: 15-Aug-1950, 67 y.o.   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 She started on 3 drug therapy, 3 times a week taken on alternate days and has been on the medication for about 2 weeks.  She has had a little nausea but really tolerating it pretty well.  She is pleased with treatment so far.  No issues with vision changes, rash, diarrhea.   Review of Systems  Constitutional: Negative for fatigue.  Gastrointestinal: Negative for abdominal distention and diarrhea.  Skin: Negative for rash.       Objective:   Physical Exam  Constitutional: She appears well-developed and well-nourished.  Eyes: No scleral icterus.  Cardiovascular: Normal rate, regular rhythm and normal heart sounds.  Pulmonary/Chest: Effort normal and breath sounds normal. No respiratory distress.  Skin: No rash noted.          Assessment & Plan:

## 2018-03-27 NOTE — Assessment & Plan Note (Signed)
Doing well on treatment so far, will continue for a projected prolonged course

## 2018-03-27 NOTE — Assessment & Plan Note (Signed)
Will check a CMP on current therapy

## 2018-03-28 LAB — COMPLETE METABOLIC PANEL WITH GFR
AG RATIO: 1.5 (calc) (ref 1.0–2.5)
ALBUMIN MSPROF: 4 g/dL (ref 3.6–5.1)
ALT: 12 U/L (ref 6–29)
AST: 18 U/L (ref 10–35)
Alkaline phosphatase (APISO): 97 U/L (ref 33–130)
BILIRUBIN TOTAL: 0.3 mg/dL (ref 0.2–1.2)
BUN: 10 mg/dL (ref 7–25)
CALCIUM: 9.4 mg/dL (ref 8.6–10.4)
CO2: 32 mmol/L (ref 20–32)
Chloride: 103 mmol/L (ref 98–110)
Creat: 0.69 mg/dL (ref 0.50–0.99)
GFR, EST AFRICAN AMERICAN: 105 mL/min/{1.73_m2} (ref >=60)
GFR, EST NON AFRICAN AMERICAN: 91 mL/min/{1.73_m2} (ref >=60)
GLOBULIN: 2.6 g/dL (ref 1.9–3.7)
Glucose, Bld: 80 mg/dL (ref 65–99)
POTASSIUM: 4.2 mmol/L (ref 3.5–5.3)
SODIUM: 142 mmol/L (ref 135–146)
Total Protein: 6.6 g/dL (ref 6.1–8.1)

## 2018-04-03 ENCOUNTER — Telehealth: Payer: Self-pay | Admitting: Pharmacist

## 2018-04-03 ENCOUNTER — Telehealth: Payer: Self-pay | Admitting: Behavioral Health

## 2018-04-03 DIAGNOSIS — K1379 Other lesions of oral mucosa: Secondary | ICD-10-CM

## 2018-04-03 MED ORDER — MAGIC MOUTHWASH W/LIDOCAINE: 5.0000 mL | Freq: Four times a day (QID) | ORAL | 0 refills | Status: DC | PRN

## 2018-04-03 NOTE — Telephone Encounter (Signed)
Yes, thanks

## 2018-04-03 NOTE — Telephone Encounter (Signed)
Called patient to return a phone call she states she already spoke to [facility] the pharmacist and the situation is handled. Pricilla Riffle RN

## 2018-04-03 NOTE — Telephone Encounter (Signed)
Patient's husband called and stated that she is having some mouth pain with the antibiotics. She is requesting some magic mouthwash as she has taken this in the past before and it has helped. Will send in some for her today.

## 2018-04-05 ENCOUNTER — Encounter: Payer: Self-pay | Admitting: Pulmonary Disease

## 2018-04-05 ENCOUNTER — Ambulatory Visit (INDEPENDENT_AMBULATORY_CARE_PROVIDER_SITE_OTHER): Payer: Medicare Other | Admitting: Pulmonary Disease

## 2018-04-05 VITALS — BP 130/72 | HR 58 | Ht 63.75 in | Wt 106.8 lb

## 2018-04-05 DIAGNOSIS — J479 Bronchiectasis, uncomplicated: Secondary | ICD-10-CM

## 2018-04-05 DIAGNOSIS — A310 Pulmonary mycobacterial infection: Secondary | ICD-10-CM

## 2018-04-05 LAB — MAC SUSCEPTIBILITY BROTH: Amikacin: 16

## 2018-04-05 LAB — AFB ORGANISM ID BY DNA PROBE
M TUBERCULOSIS COMPLEX: NEGATIVE
M avium complex: POSITIVE — AB

## 2018-04-05 LAB — ACID FAST SMEAR (AFB, MYCOBACTERIA): Acid Fast Smear: NEGATIVE

## 2018-04-05 LAB — ACID FAST CULTURE WITH REFLEXED SENSITIVITIES (MYCOBACTERIA): Acid Fast Culture: POSITIVE — AB

## 2018-04-05 LAB — FUNGUS CULTURE WITH STAIN

## 2018-04-05 LAB — FUNGUS CULTURE RESULT

## 2018-04-05 LAB — FUNGAL ORGANISM REFLEX

## 2018-04-05 LAB — ACID FAST SMEAR (AFB)

## 2018-04-05 NOTE — Patient Instructions (Signed)
Continue your inhalers and percussion device I will follow back with you in 6 months.

## 2018-04-05 NOTE — Progress Notes (Signed)
Lindsay Schultz    [id number]    1950-07-07  Primary Care Physician:Plotnikov, Evie Lacks, MD  Referring Physician: Cassandria Anger, MD [place], [place] [id number]  Chief complaint:   Follow up for Bronchiectasis with MAI infection Sinusitis  Seasonal allergies GERD  HPI: Lindsay Schultz is a 67 year old female with history of asthmatic bronchitis, sinusitis, and seasonal allergies.  She's had a workup for her bronchiectasis including CBC, Ig levels, sputum cultures, A1AT levels, RF which were normal. PFTs do not show any obstruction and very mild restriction and reduction in diffusion capacity. Her husband has recently diagnosed with colon cancer s/p resection and chemo in 2017. Lindsay Schultz was stressed during all this but he is doing better now with cancer in remission.  She has attacks of bronchiectasis exacerbations about once a year. She states that the infection usually begins in the sinus and then moves into the lung. This usually resolves after course of antibiotics. She has H/O seasonal allergies (worst in fall and spring) and asthmatic bronchitis. Never smoked.   Interim history: Underwent bronchoscopy on 9/5 with cultures growing MAI She has been seen by ID and started on antibiotic therapy  Has complaints of burning sensation in the mouth and was started on Magic wash wash States that cough is at baseline, nonproductive in nature.  She is using the chest vest for clearance of secretion.  Outpatient Encounter Medications as of 04/05/2018  Medication Sig  . acetaminophen (TYLENOL) 500 MG tablet Take 500-1,000 mg by mouth every 6 (six) hours as needed (for pain.).  Marland Kitchen albuterol (PROVENTIL HFA;VENTOLIN HFA) 108 (90 Base) MCG/ACT inhaler Inhale 2 puffs into the lungs every 6 (six) hours as needed for wheezing or shortness of breath.  Marland Kitchen azithromycin (ZITHROMAX) 500 MG tablet Take 1 tablet (500 mg total) by mouth 3 (three) times a week.  . calcitonin, salmon,  (MIACALCIN/FORTICAL) 200 UNIT/ACT nasal spray Place 1 spray into alternate nostrils daily.  . Carboxymethylcellulose Sodium (THERATEARS) 0.25 % SOLN Place 1 drop into both eyes 3 (three) times daily as needed (for dry/irritated eyes.).  Marland Kitchen cholecalciferol (VITAMIN D) 1000 units tablet Take 1,000 Units by mouth 2 (two) times daily.  Marland Kitchen CRANBERRY PO Take 1 capsule by mouth every evening.  . Cyanocobalamin (VITAMIN B-12) 500 MCG SUBL 1 tab sl qd (Patient taking differently: Take 500 mcg by mouth 2 (two) times daily. )  . ethambutol (MYAMBUTOL) 400 MG tablet Take 3 tablets (1,200 mg total) by mouth 3 (three) times a week.  . famotidine (PEPCID) 20 MG tablet Take 20 mg by mouth daily.  . fluticasone (FLONASE) 50 MCG/ACT nasal spray Place 1 spray into both nostrils daily as needed. (Patient taking differently: Place 1 spray into both nostrils daily. )  . Lactobacillus (ACIDOPHILUS PO) Take 1 capsule by mouth every evening.  . magic mouthwash w/lidocaine SOLN Take 5 mLs by mouth 4 (four) times daily as needed.  . Multiple Vitamins-Minerals (MULTIVITAMIN WITH MINERALS) tablet Take 1 tablet by mouth every evening.   . nadolol (CORGARD) 40 MG tablet Take 0.5 tablets (20 mg total) by mouth 2 (two) times daily.  . ondansetron (ZOFRAN ODT) 4 MG disintegrating tablet Take 1 tablet (4 mg total) by mouth every 8 (eight) hours as needed for nausea or vomiting.  . pantoprazole (PROTONIX) 40 MG tablet Take 1 tablet (40 mg total) by mouth daily.  Marland Kitchen pyridOXINE (VITAMIN B-6) 100 MG tablet Take 100 mg by mouth 2 (  two) times daily.  . rifampin (RIFADIN) 300 MG capsule Take 2 capsules (600 mg total) by mouth 3 (three) times a week.   No facility-administered encounter medications on file as of 04/05/2018.    Physical Exam: Blood pressure 130/72, pulse (!) 58, height 5' 3.75" (1.619 m), weight 106 lb 12.8 oz (48.4 kg), SpO2 97 %. Gen:      No acute distress HEENT:  EOMI, sclera anicteric Neck:     No masses; no  thyromegaly Lungs:    Clear to auscultation bilaterally; normal respiratory effort CV:         Regular rate and rhythm; no murmurs Abd:      + bowel sounds; soft, non-tender; no palpable masses, no distension Ext:    No edema; adequate peripheral perfusion Skin:      Warm and dry; no rash Neuro: alert and oriented x 3 Psych: normal mood and affect  Data Reviewed: Imaging CT scan 11/15/14-Multiple nodules in the left upper lobe, bilateral bronchiectasis. CT scan 01/24/16 Stable pulmonary nodules, bilateral bronchiectasis. Left upper lobe ground glass opacities. Images reviewed. CT scan 11/16/2017- stable pulmonary nodules, bilateral bronchiectasis, new area of opacity in the right upper lobe consistent with mucus impaction.  I have reviewed the images personally  Labs 12/09/14 IgG-773 IgG 8-2 37 IgM-64 IgE-56 Rheumatoid factor < 10 All within normal limits.  AFB and fungal cultures 11/19/14- no acid-fast bacilli, Candida albicans. 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 Repeat sputum AFB 01/05/2018-negative  Bronchoscopy 02/21/2018 Cultures- Mycobacterium avium complex BAL cell count-WBC 635, 9% lymphs, 1% eos, 76% neutrophils  PFTs  02/10/15 FVC 2.35 (74%), FEV1 1.79 (74%), F/F 77, TLC 79%, DLCO 78% No obstruction. Mild reduction in lung volumes and diffusion capacity  Assessment:  Bronchiectasis, MAC infection Started on treatment with antibiotics per ID. Continue chest percussion with vest for mucociliary clearance.  Abnormal CT scan with lung nodules Likely benign as most of them are remained stable.  Her last CT scan from 7/31 shows new area of opacity in the right upper lobe which is consistent with mucoid impaction. Follow-up CT scan in 6 months  Asthmatic  Bronchitis She is asymptomatic with symptoms mostly during spring and fall allergy. She'll continue the albuterol when necessary.  GERD Currently on Zantac.  She has stopped her  Protonix  Health maintenance 12/13/2017-influenza 02/07/2016-Prevnar 05/28/2014-Pneumovax  Plan/Recommendations: - Treatment for MAI per infectious disease - Continue albuterol as needed - Percussion vest - Follow-up CT in 6 months  Marshell Garfinkel MD  Pulmonary and Critical Care 04/05/2018, 11:42 AM  CC: Plotnikov, Evie Lacks, MD

## 2018-04-17 ENCOUNTER — Ambulatory Visit (INDEPENDENT_AMBULATORY_CARE_PROVIDER_SITE_OTHER): Payer: Medicare Other | Admitting: Internal Medicine

## 2018-04-17 ENCOUNTER — Encounter: Payer: Self-pay | Admitting: Internal Medicine

## 2018-04-17 VITALS — BP 126/68 | HR 55 | Temp 97.7°F | Ht 63.75 in | Wt 106.0 lb

## 2018-04-17 DIAGNOSIS — J471 Bronchiectasis with (acute) exacerbation: Secondary | ICD-10-CM

## 2018-04-17 DIAGNOSIS — R634 Abnormal weight loss: Secondary | ICD-10-CM | POA: Insufficient documentation

## 2018-04-17 DIAGNOSIS — K5903 Drug induced constipation: Secondary | ICD-10-CM | POA: Diagnosis not present

## 2018-04-17 DIAGNOSIS — J479 Bronchiectasis, uncomplicated: Secondary | ICD-10-CM

## 2018-04-17 DIAGNOSIS — A310 Pulmonary mycobacterial infection: Secondary | ICD-10-CM

## 2018-04-17 DIAGNOSIS — K5900 Constipation, unspecified: Secondary | ICD-10-CM | POA: Insufficient documentation

## 2018-04-17 DIAGNOSIS — G4700 Insomnia, unspecified: Secondary | ICD-10-CM

## 2018-04-17 NOTE — Assessment & Plan Note (Signed)
On tripple abx for 18 months starting 03/2018 (Rifampin, zithromax, ethambutol)

## 2018-04-17 NOTE — Assessment & Plan Note (Signed)
For insomnia: Melatonin Valerian root

## 2018-04-17 NOTE — Progress Notes (Signed)
Subjective:  Patient ID: Lindsay Schultz, female    DOB: March 07, 1951  Age: 67 y.o. MRN: [id number]  CC: No chief complaint on file.   HPI Tziporah Knoke Bezek presents for constipation on the abx C/o insomnia  Outpatient Medications Prior to Visit  Medication Sig Dispense Refill  . acetaminophen (TYLENOL) 500 MG tablet Take 500-1,000 mg by mouth every 6 (six) hours as needed (for pain.).    Marland Kitchen albuterol (PROVENTIL HFA;VENTOLIN HFA) 108 (90 Base) MCG/ACT inhaler Inhale 2 puffs into the lungs every 6 (six) hours as needed for wheezing or shortness of breath. 1 Inhaler 3  . azithromycin (ZITHROMAX) 500 MG tablet Take 1 tablet (500 mg total) by mouth 3 (three) times a week. 12 tablet 11  . calcitonin, salmon, (MIACALCIN/FORTICAL) 200 UNIT/ACT nasal spray Place 1 spray into alternate nostrils daily. 3.7 mL 11  . Carboxymethylcellulose Sodium (THERATEARS) 0.25 % SOLN Place 1 drop into both eyes 3 (three) times daily as needed (for dry/irritated eyes.).    Marland Kitchen cholecalciferol (VITAMIN D) 1000 units tablet Take 1,000 Units by mouth 2 (two) times daily.    Marland Kitchen CRANBERRY PO Take 1 capsule by mouth every evening.    . Cyanocobalamin (VITAMIN B-12) 500 MCG SUBL 1 tab sl qd (Patient taking differently: Take 500 mcg by mouth 2 (two) times daily. ) 100 tablet 11  . ethambutol (MYAMBUTOL) 400 MG tablet Take 3 tablets (1,200 mg total) by mouth 3 (three) times a week. 36 tablet 11  . famotidine (PEPCID) 20 MG tablet Take 20 mg by mouth daily.    . fluticasone (FLONASE) 50 MCG/ACT nasal spray Place 1 spray into both nostrils daily as needed. (Patient taking differently: Place 1 spray into both nostrils daily. ) 16 g 11  . Lactobacillus (ACIDOPHILUS PO) Take 1 capsule by mouth every evening.    . magic mouthwash w/lidocaine SOLN Take 5 mLs by mouth 4 (four) times daily as needed. 200 mL 0  . Multiple Vitamins-Minerals (MULTIVITAMIN WITH MINERALS) tablet Take 1 tablet by mouth every evening.     . nadolol (CORGARD) 40 MG tablet  Take 0.5 tablets (20 mg total) by mouth 2 (two) times daily. 30 tablet 11  . ondansetron (ZOFRAN ODT) 4 MG disintegrating tablet Take 1 tablet (4 mg total) by mouth every 8 (eight) hours as needed for nausea or vomiting. 30 tablet 5  . pantoprazole (PROTONIX) 40 MG tablet Take 1 tablet (40 mg total) by mouth daily. 30 tablet 11  . pyridOXINE (VITAMIN B-6) 100 MG tablet Take 100 mg by mouth 2 (two) times daily.    . rifampin (RIFADIN) 300 MG capsule Take 2 capsules (600 mg total) by mouth 3 (three) times a week. 24 capsule 11   No facility-administered medications prior to visit.     ROS: Review of Systems  Objective:  BP 126/68 (BP Location: Left Arm, Patient Position: Sitting, Cuff Size: Normal)   Pulse (!) 55   Temp 97.7 F (36.5 C) (Oral)   Ht 5' 3.75" (1.619 m)   Wt 106 lb (48.1 kg)   SpO2 98%   BMI 18.34 kg/m   BP Readings from Last 3 Encounters:  04/17/18 126/68  04/05/18 130/72  03/27/18 (!) 158/80    Wt Readings from Last 3 Encounters:  04/17/18 106 lb (48.1 kg)  04/05/18 106 lb 12.8 oz (48.4 kg)  03/27/18 107 lb (48.5 kg)    Physical Exam  Lab Results  Component Value Date   WBC 10.4 12/13/2017  HGB 13.6 12/13/2017   HCT 40.9 12/13/2017   PLT 242.0 12/13/2017   GLUCOSE 80 03/27/2018   CHOL 218 (H) 12/13/2017   TRIG 203.0 (H) 12/13/2017   HDL 56.60 12/13/2017   LDLDIRECT 140.0 12/13/2017   LDLCALC 163 (H) 11/07/2015   ALT 12 03/27/2018   AST 18 03/27/2018   NA 142 03/27/2018   K 4.2 03/27/2018   CL 103 03/27/2018   CREATININE 0.69 03/27/2018   BUN 10 03/27/2018   CO2 32 03/27/2018   TSH 1.38 12/13/2017   INR 1.05 01/26/2016    Dg Chest Port 1 View  Result Date: 02/21/2018 CLINICAL DATA:  67 year old female status post bronchoscopy with bronchoalveolar lavage for bronchiectasis, 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 EXAM: PORTABLE CHEST 1 VIEW COMPARISON:  Chest radiographs 12/23/2017 and earlier. FINDINGS: Portable AP semi upright view at 0922 hours. Stable lung  volumes and mediastinal contours. Hazy opacity throughout the right upper lobe compatible with sequelae of BAL. No pneumothorax. No other acute pulmonary opacity. Visualized tracheal air column is within normal limits. Mild cardiomegaly and tortuosity of the aorta. No acute osseous abnormality identified. Negative visible bowel gas pattern. IMPRESSION: Hazy opacity in the right upper lobe compatible with bronchoalveolar lavage. No pneumothorax or adverse features status post bronchoscopy. Electronically Signed   By: Genevie Ann M.D.   On: 02/21/2018 09:34    Assessment & Plan:   There are no diagnoses linked to this encounter.   No orders of the defined types were placed in this encounter.    Follow-up: No follow-ups on file.  Walker Kehr, MD

## 2018-04-17 NOTE — Assessment & Plan Note (Signed)
Miralax Senakot S Laxative tea

## 2018-04-17 NOTE — Patient Instructions (Addendum)
For constipation: Miralax Senakot S Laxative tea  For insomnia: Melatonin Valerian root  Use Arm&Hammer Peroxicare tooth paste

## 2018-04-17 NOTE — Assessment & Plan Note (Signed)
Wt Readings from Last 3 Encounters:  04/17/18 106 lb (48.1 kg)  04/05/18 106 lb 12.8 oz (48.4 kg)  03/27/18 107 lb (48.5 kg)

## 2018-04-18 ENCOUNTER — Ambulatory Visit: Payer: Medicare Other | Admitting: Internal Medicine

## 2018-04-25 ENCOUNTER — Telehealth: Payer: Self-pay | Admitting: Behavioral Health

## 2018-04-25 NOTE — Telephone Encounter (Signed)
Called Mrs. Freshour and informed her to continue the Magic mouthwash.  She states she has already spoken with her PCP and he recommended she use arm and hammer tooth paste.  She states that has not helped much.  She requested and earlier appointment.  She is scheduled for tomorrow with 'Dr. Linus Salmons. Pricilla Riffle RN

## 2018-04-25 NOTE — Telephone Encounter (Signed)
Patient called this AM stating she has continued to have mouth pain, burning of the tongue and states it is now in her throat since taking Ethambutol, Azithromycin, and Rifampin.  She states she tried the [employer] with minimal relief.  Patient is wondering if there is another medication she can take for relief.  Patient states she uses the Cetronia in [place]. Pricilla Riffle RN

## 2018-04-25 NOTE — Telephone Encounter (Signed)
I am not sure; Lindsay Schultz?

## 2018-04-25 NOTE — Telephone Encounter (Signed)
This is a new one for me.. I can't find where any of those 3 medications would cause mouth ulcers/burning of the tongue or throat. Maybe her PCP should take a look at it or an anti-fungal? Unsure.

## 2018-04-26 ENCOUNTER — Encounter: Payer: Self-pay | Admitting: Internal Medicine

## 2018-04-26 ENCOUNTER — Ambulatory Visit (INDEPENDENT_AMBULATORY_CARE_PROVIDER_SITE_OTHER): Payer: Medicare Other | Admitting: Internal Medicine

## 2018-04-26 VITALS — BP 163/83 | HR 56 | Temp 98.3°F | Wt 105.0 lb

## 2018-04-26 DIAGNOSIS — Z23 Encounter for immunization: Secondary | ICD-10-CM | POA: Diagnosis not present

## 2018-04-26 DIAGNOSIS — A310 Pulmonary mycobacterial infection: Secondary | ICD-10-CM | POA: Diagnosis not present

## 2018-04-26 DIAGNOSIS — B370 Candidal stomatitis: Secondary | ICD-10-CM | POA: Diagnosis not present

## 2018-04-26 DIAGNOSIS — K5903 Drug induced constipation: Secondary | ICD-10-CM

## 2018-04-26 MED ORDER — FLUCONAZOLE 200 MG PO TABS: 200.0000 mg | ORAL_TABLET | Freq: Every day | ORAL | 2 refills | Status: DC

## 2018-04-26 NOTE — Assessment & Plan Note (Addendum)
New problem.  Probably thrush so will try fluconazole x 7 days.  Has refills if it happens again  If no improvement, I am not sure what else can be done.  Not directly related to the medications.

## 2018-04-26 NOTE — Assessment & Plan Note (Signed)
Taking medications well.  I will check her CMP and rtc 3 months.  Will do a sputum at that time.

## 2018-04-26 NOTE — Assessment & Plan Note (Signed)
This has improved off of zofran and with above medications given by Dr. Alain Marion

## 2018-04-26 NOTE — Progress Notes (Signed)
   Subjective:    Patient ID: Lindsay Schultz, female    DOB: [birth year], 68 y.o.   MRN: [id number]  HPI Here for a work in visit. She has a new problem of burning in her throat.  Has occurred before with antibiotics.  Makes it difficult for hot liquids.  No difficulty swallowing otherwise. Still taking her three antibiotics.  Some nausea still but tolerating.  Had issues with constipation with the zofran so stopped and tolerating nausea pretty well otherwise.     Review of Systems  Constitutional: Negative for chills and fever.  Gastrointestinal: Negative for abdominal pain and diarrhea.  Skin: Negative for rash.       Objective:   Physical Exam Constitutional:      Appearance: Normal appearance.  HENT:     Mouth/Throat:     Pharynx: No oropharyngeal exudate or posterior oropharyngeal erythema.     Comments: No oropharyngeal thrush noted Eyes:     General: No scleral icterus. Cardiovascular:     Rate and Rhythm: Normal rate and regular rhythm.  Pulmonary:     Effort: Pulmonary effort is normal. No respiratory distress.     Breath sounds: Normal breath sounds.  Neurological:     Mental Status: She is alert.   SH: no tobacco       Assessment & Plan:

## 2018-04-27 LAB — COMPLETE METABOLIC PANEL WITH GFR
AG RATIO: 1.9 (calc) (ref 1.0–2.5)
ALKALINE PHOSPHATASE (APISO): 93 U/L (ref 33–130)
ALT: 12 U/L (ref 6–29)
AST: 18 U/L (ref 10–35)
Albumin: 4.3 g/dL (ref 3.6–5.1)
BUN: 16 mg/dL (ref 7–25)
CO2: 31 mmol/L (ref 20–32)
CREATININE: 0.81 mg/dL (ref 0.50–0.99)
Calcium: 9.1 mg/dL (ref 8.6–10.4)
Chloride: 102 mmol/L (ref 98–110)
GFR, Est African American: 87 mL/min/{1.73_m2} (ref >=60)
GFR, Est Non African American: 75 mL/min/{1.73_m2} (ref >=60)
Globulin: 2.3 g/dL (calc) (ref 1.9–3.7)
Glucose, Bld: 78 mg/dL (ref 65–99)
Potassium: 3.9 mmol/L (ref 3.5–5.3)
SODIUM: 140 mmol/L (ref 135–146)
Total Bilirubin: 0.3 mg/dL (ref 0.2–1.2)
Total Protein: 6.6 g/dL (ref 6.1–8.1)

## 2018-05-03 ENCOUNTER — Ambulatory Visit: Payer: Medicare Other | Admitting: Internal Medicine

## 2018-06-27 ENCOUNTER — Other Ambulatory Visit: Payer: Self-pay | Admitting: Internal Medicine

## 2018-06-27 DIAGNOSIS — Z1231 Encounter for screening mammogram for malignant neoplasm of breast: Secondary | ICD-10-CM

## 2018-07-18 ENCOUNTER — Ambulatory Visit: Payer: Medicare Other | Admitting: Internal Medicine

## 2018-07-24 ENCOUNTER — Ambulatory Visit: Payer: Medicare Other

## 2018-07-31 ENCOUNTER — Ambulatory Visit: Payer: Medicare Other

## 2018-07-31 ENCOUNTER — Ambulatory Visit: Payer: Medicare Other | Admitting: Internal Medicine

## 2018-08-30 ENCOUNTER — Ambulatory Visit (INDEPENDENT_AMBULATORY_CARE_PROVIDER_SITE_OTHER): Payer: Medicare Other | Admitting: Internal Medicine

## 2018-08-30 ENCOUNTER — Telehealth: Payer: Self-pay | Admitting: Family

## 2018-08-30 DIAGNOSIS — M545 Low back pain, unspecified: Secondary | ICD-10-CM

## 2018-08-30 DIAGNOSIS — I10 Essential (primary) hypertension: Secondary | ICD-10-CM | POA: Diagnosis not present

## 2018-08-30 DIAGNOSIS — J479 Bronchiectasis, uncomplicated: Secondary | ICD-10-CM | POA: Diagnosis not present

## 2018-08-30 MED ORDER — TIZANIDINE HCL 2 MG PO TABS: 2.0000 mg | ORAL_TABLET | Freq: Four times a day (QID) | ORAL | 1 refills | Status: DC | PRN

## 2018-08-30 MED ORDER — TRAMADOL HCL 50 MG PO TABS: 50.0000 mg | ORAL_TABLET | Freq: Four times a day (QID) | ORAL | 0 refills | Status: DC | PRN

## 2018-08-30 NOTE — Telephone Encounter (Signed)
Patient scheduled with Jenny Reichmann today

## 2018-08-30 NOTE — Progress Notes (Signed)
Patient ID: Lindsay Schultz, female   DOB: Jan 15, 1951, 68 y.o.   MRN: [id number]  .Virtual Visit via Video Note by phone only due to failed video  I connected with Gracelin Weisberg [place] on 09/03/18 at  9:40 AM EDT by a video enabled telemedicine application and verified that I am speaking with the correct person using two identifiers.  Location: Patient: at home Provider: at office   I discussed the limitations of evaluation and management by telemedicine and the availability of in person appointments. The patient expressed understanding and agreed to proceed.  History of Present Illness: Here to c/o acute onset left lower back pain, started after yardwork with bending and lifting, radiates to the left hip and buttock area but Pt denies bowel or bladder change, fever, wt loss,  worsening LE pain/numbness/weakness, gait change or falls.  Has been trying heat, tylenol, soaks and lying down with limited success.  Pain now about 6/10, worse to stand up or twist at waist.  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   Past Medical History:  Diagnosis Date  . Allergic rhinitis   . Benign paroxysmal positional vertigo 06/14/2015  . Breast cancer ([place]) 2008  . Bronchiectasis ([place])   . GERD (gastroesophageal reflux disease)   . HTN (hypertension)   . Hx of breast cancer   . Osteopenia   . Personal history of radiation therapy 2008   Past Surgical History:  Procedure Laterality Date  . ABDOMINAL HYSTERECTOMY    . APPENDECTOMY    . BREAST LUMPECTOMY  2008   Left  . NASAL SINUS SURGERY  1997  . VIDEO BRONCHOSCOPY Bilateral 02/21/2018   Procedure: VIDEO BRONCHOSCOPY WITHOUT FLUORO;  Surgeon: Marshell Garfinkel, MD;  Location: [facility];  Service: Cardiopulmonary;  Laterality: Bilateral;    reports that she has never smoked. She has never  used smokeless tobacco. She reports that she does not drink alcohol or use drugs. family history includes Arthritis (age of onset: 82) in her father; Emphysema in her father; Heart disease in her father; Hodgkin's lymphoma in her brother; Hypertension in an other family member; Sjogren's syndrome in her mother. Allergies  Allergen Reactions  . Meloxicam Nausea And Vomiting    Upset stomach  . Paroxetine Other (See Comments)    Passed out  . Penicillins Other (See Comments)    passed out after a shot at 68 y.o  Has patient had a PCN reaction causing immediate rash, facial/tongue/throat swelling, SOB or lightheadedness with hypotension: no Has patient had a PCN reaction causing severe rash involving mucus membranes or skin necrosis:  no Has patient had a PCN reaction that required hospitalization: no Has patient had a PCN reaction occurring within the last 10 years: no If all of the above answers are "NO", then may proceed with Cephalosporin use.   . Ciprofloxacin Rash     rash along the vein w/IV Cipro  . Clarithromycin Rash  . Zithromax [Azithromycin] Rash   Current Outpatient Medications on File Prior to Visit  Medication Sig Dispense Refill  . acetaminophen (TYLENOL) 500 MG tablet Take 500-1,000 mg by mouth every 6 (six) hours as needed (for pain.).    Marland Kitchen albuterol (PROVENTIL HFA;VENTOLIN HFA) 108 (90 Base) MCG/ACT inhaler Inhale 2 puffs into the lungs every 6 (six) hours as needed for wheezing or shortness  of breath. 1 Inhaler 3  . azithromycin (ZITHROMAX) 500 MG tablet Take 1 tablet (500 mg total) by mouth 3 (three) times a week. 12 tablet 11  . calcitonin, salmon, (MIACALCIN/FORTICAL) 200 UNIT/ACT nasal spray Place 1 spray into alternate nostrils daily. 3.7 mL 11  . Carboxymethylcellulose Sodium (THERATEARS) 0.25 % SOLN Place 1 drop into both eyes 3 (three) times daily as needed (for dry/irritated eyes.).    Marland Kitchen cholecalciferol (VITAMIN D) 1000 units tablet Take 1,000 Units by mouth 2  (two) times daily.    Marland Kitchen CRANBERRY PO Take 1 capsule by mouth every evening.    . Cyanocobalamin (VITAMIN B-12) 500 MCG SUBL 1 tab sl qd (Patient taking differently: Take 500 mcg by mouth 2 (two) times daily. ) 100 tablet 11  . ethambutol (MYAMBUTOL) 400 MG tablet Take 3 tablets (1,200 mg total) by mouth 3 (three) times a week. 36 tablet 11  . famotidine (PEPCID) 20 MG tablet Take 20 mg by mouth daily.    . fluconazole (DIFLUCAN) 200 MG tablet Take 1 tablet (200 mg total) by mouth daily. 7 tablet 2  . fluticasone (FLONASE) 50 MCG/ACT nasal spray Place 1 spray into both nostrils daily as needed. (Patient taking differently: Place 1 spray into both nostrils daily. ) 16 g 11  . Lactobacillus (ACIDOPHILUS PO) Take 1 capsule by mouth every evening.    . magic mouthwash w/lidocaine SOLN Take 5 mLs by mouth 4 (four) times daily as needed. 200 mL 0  . Multiple Vitamins-Minerals (MULTIVITAMIN WITH MINERALS) tablet Take 1 tablet by mouth every evening.     . nadolol (CORGARD) 40 MG tablet Take 0.5 tablets (20 mg total) by mouth 2 (two) times daily. 30 tablet 11  . ondansetron (ZOFRAN ODT) 4 MG disintegrating tablet Take 1 tablet (4 mg total) by mouth every 8 (eight) hours as needed for nausea or vomiting. 30 tablet 5  . pantoprazole (PROTONIX) 40 MG tablet Take 1 tablet (40 mg total) by mouth daily. 30 tablet 11  . pyridOXINE (VITAMIN B-6) 100 MG tablet Take 100 mg by mouth 2 (two) times daily.    . rifampin (RIFADIN) 300 MG capsule Take 2 capsules (600 mg total) by mouth 3 (three) times a week. 24 capsule 11   No current facility-administered medications on file prior to visit.     Observations/Objective: Unable due to phone only Lab Results  Component Value Date   WBC 10.4 12/13/2017   HGB 13.6 12/13/2017   HCT 40.9 12/13/2017   PLT 242.0 12/13/2017   GLUCOSE 78 04/26/2018   CHOL 218 (H) 12/13/2017   TRIG 203.0 (H) 12/13/2017   HDL 56.60 12/13/2017   LDLDIRECT 140.0 12/13/2017   LDLCALC 163 (H)  11/07/2015   ALT 12 04/26/2018   AST 18 04/26/2018   NA 140 04/26/2018   K 3.9 04/26/2018   CL 102 04/26/2018   CREATININE 0.81 04/26/2018   BUN 16 04/26/2018   CO2 31 04/26/2018   TSH 1.38 12/13/2017   INR 1.05 01/26/2016   Assessment and Plan: See notes  Follow Up Instructions: See notes   I discussed the assessment and treatment plan with the patient. The patient was provided an opportunity to ask questions and all were answered. The patient agreed with the plan and demonstrated an understanding of the instructions.   The patient was advised to call back or seek an in-person evaluation if the symptoms worsen or if the condition fails to improve as anticipated.  Cathlean Cower, MD

## 2018-08-30 NOTE — Telephone Encounter (Signed)
Patient believes she has a pulled muscle in her back.  She would like to do a phone visit today.  She does not believe she would be able to ride in a car and does not have access to complete a virtual visit.  Please advise.

## 2018-08-30 NOTE — Telephone Encounter (Signed)
Noted  

## 2018-08-30 NOTE — Patient Instructions (Signed)
Please take all new medication as prescribed - the muscle relaxer and the tramadol as needed  Please continue all other medications as before, and refills have been done if requested.  Please have the pharmacy call with any other refills you may need.  Please keep your appointments with your specialists as you may have planned

## 2018-09-03 ENCOUNTER — Encounter: Payer: Self-pay | Admitting: Internal Medicine

## 2018-09-03 DIAGNOSIS — M545 Low back pain, unspecified: Secondary | ICD-10-CM | POA: Insufficient documentation

## 2018-09-03 NOTE — Assessment & Plan Note (Signed)
Likely c/w msk strain, for muscle relaxer prn, tramadol prn,  to f/u any worsening symptoms or concerns

## 2018-09-03 NOTE — Assessment & Plan Note (Signed)
stable overall by history and exam, recent data reviewed with pt, and pt to continue medical treatment as before,  to f/u any worsening symptoms or concerns  

## 2018-09-03 NOTE — Assessment & Plan Note (Signed)
pt encouraged to check BP at home on regular basis, with goal < 140/90

## 2018-09-04 ENCOUNTER — Ambulatory Visit: Payer: Self-pay | Admitting: Internal Medicine

## 2018-09-04 NOTE — Telephone Encounter (Signed)
Please advise, do you want to see pt in office?

## 2018-09-04 NOTE — Telephone Encounter (Signed)
Pt called in c/o a red, raised rash with little blisters on her back, back of her arms and a little on her abd that itches real bad.   She did a virtual visit with Dr. Jenny Reichmann on 08/30/2018 for left lower back pain.  He prescribed Tramadol and Zanaflex. She thinks she may be having an allergic reaction to one of them.     See triage notes.  I have sent these triage notes to Dr. Gwynn Burly office at [facility] high priority for someone to contact her for further disposition.    Reason for Disposition . Caller has URGENT medication question about med that PCP prescribed and triager unable to answer question  Answer Assessment - Initial Assessment Questions 1. SYMPTOMS: "Do you have any symptoms?"     I did a virtual  Visit with Dr. Jenny Reichmann for pain in my lower back.   He started me on a muscle relaxer.   I took the Tramadol.    Saturday I noticed a rash on my back.    It's on my back and back of arms and a little on my abd. It's red, raised and gets blisters.  It itches a lot. 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   I had a bacterial infection.   I've been on them 6 months without a problem.   I stopped the Tramadol and Zanaflex on Sunday.   Last Zanaflex 4 AM Sunday morning. 2. SEVERITY: If symptoms are present, ask "Are they mild, moderate or severe?"     Very itchy.  Protocols used: MEDICATION QUESTION CALL-A-AH

## 2018-09-04 NOTE — Telephone Encounter (Signed)
Opened by mistake.   Pt been triaged and note sent to office.

## 2018-09-05 ENCOUNTER — Ambulatory Visit (INDEPENDENT_AMBULATORY_CARE_PROVIDER_SITE_OTHER): Payer: Medicare Other | Admitting: Internal Medicine

## 2018-09-05 ENCOUNTER — Encounter: Payer: Self-pay | Admitting: Internal Medicine

## 2018-09-05 ENCOUNTER — Other Ambulatory Visit: Payer: Self-pay

## 2018-09-05 DIAGNOSIS — E538 Deficiency of other specified B group vitamins: Secondary | ICD-10-CM

## 2018-09-05 DIAGNOSIS — M545 Low back pain, unspecified: Secondary | ICD-10-CM

## 2018-09-05 DIAGNOSIS — R21 Rash and other nonspecific skin eruption: Secondary | ICD-10-CM

## 2018-09-05 MED ORDER — TRIAMCINOLONE ACETONIDE 0.1 % EX CREA: 1.0000 "application " | TOPICAL_CREAM | Freq: Four times a day (QID) | CUTANEOUS | 1 refills | Status: DC

## 2018-09-05 MED ORDER — METHYLPREDNISOLONE ACETATE 80 MG/ML IJ SUSP
80.0000 mg | Freq: Once | INTRAMUSCULAR | Status: AC
  Administered 2018-09-05: 15:00:00 80 mg via INTRAMUSCULAR

## 2018-09-05 NOTE — Assessment & Plan Note (Addendum)
diffuse - ?zanaflex vs other, ie Zithromax Depo-medrol D/c Zanaflex triamc cream

## 2018-09-05 NOTE — Telephone Encounter (Signed)
Appt scheduled for in office.

## 2018-09-05 NOTE — Assessment & Plan Note (Signed)
A little better Depomedrol should help  Tylenol

## 2018-09-05 NOTE — Addendum Note (Signed)
Addended by: Karren Cobble on: 09/05/2018 02:55 PM   Modules accepted: Orders

## 2018-09-05 NOTE — Progress Notes (Signed)
Subjective:  Patient ID: Lindsay Schultz, female    DOB: [birth year]  Age: 68 y.o. MRN: [id number]  CC: No chief complaint on file.   HPI Lindsay Schultz presents for LBP on the L. Took Tramadol an Xanaflex. C/o rash since Sat - new rash daily F/u LBP - a little better  Outpatient Medications Prior to Visit  Medication Sig Dispense Refill  . acetaminophen (TYLENOL) 500 MG tablet Take 500-1,000 mg by mouth every 6 (six) hours as needed (for pain.).    Marland Kitchen albuterol (PROVENTIL HFA;VENTOLIN HFA) 108 (90 Base) MCG/ACT inhaler Inhale 2 puffs into the lungs every 6 (six) hours as needed for wheezing or shortness of breath. 1 Inhaler 3  . azithromycin (ZITHROMAX) 500 MG tablet Take 1 tablet (500 mg total) by mouth 3 (three) times a week. 12 tablet 11  . calcitonin, salmon, (MIACALCIN/FORTICAL) 200 UNIT/ACT nasal spray Place 1 spray into alternate nostrils daily. 3.7 mL 11  . Carboxymethylcellulose Sodium (THERATEARS) 0.25 % SOLN Place 1 drop into both eyes 3 (three) times daily as needed (for dry/irritated eyes.).    Marland Kitchen cholecalciferol (VITAMIN D) 1000 units tablet Take 1,000 Units by mouth 2 (two) times daily.    Marland Kitchen CRANBERRY PO Take 1 capsule by mouth every evening.    . Cyanocobalamin (VITAMIN B-12) 500 MCG SUBL 1 tab sl qd (Patient taking differently: Take 500 mcg by mouth 2 (two) times daily. ) 100 tablet 11  . ethambutol (MYAMBUTOL) 400 MG tablet Take 3 tablets (1,200 mg total) by mouth 3 (three) times a week. 36 tablet 11  . famotidine (PEPCID) 20 MG tablet Take 20 mg by mouth daily.    . fluconazole (DIFLUCAN) 200 MG tablet Take 1 tablet (200 mg total) by mouth daily. 7 tablet 2  . fluticasone (FLONASE) 50 MCG/ACT nasal spray Place 1 spray into both nostrils daily as needed. (Patient taking differently: Place 1 spray into both nostrils daily. ) 16 g 11  . Lactobacillus (ACIDOPHILUS PO) Take 1 capsule by mouth every evening.    . magic mouthwash w/lidocaine SOLN Take 5 mLs by mouth 4 (four) times daily  as needed. 200 mL 0  . Multiple Vitamins-Minerals (MULTIVITAMIN WITH MINERALS) tablet Take 1 tablet by mouth every evening.     . nadolol (CORGARD) 40 MG tablet Take 0.5 tablets (20 mg total) by mouth 2 (two) times daily. 30 tablet 11  . ondansetron (ZOFRAN ODT) 4 MG disintegrating tablet Take 1 tablet (4 mg total) by mouth every 8 (eight) hours as needed for nausea or vomiting. 30 tablet 5  . pantoprazole (PROTONIX) 40 MG tablet Take 1 tablet (40 mg total) by mouth daily. 30 tablet 11  . pyridOXINE (VITAMIN B-6) 100 MG tablet Take 100 mg by mouth 2 (two) times daily.    . rifampin (RIFADIN) 300 MG capsule Take 2 capsules (600 mg total) by mouth 3 (three) times a week. 24 capsule 11  . tiZANidine (ZANAFLEX) 2 MG tablet Take 1 tablet (2 mg total) by mouth every 6 (six) hours as needed for muscle spasms. (Patient not taking: Reported on 09/05/2018) 40 tablet 1  . traMADol (ULTRAM) 50 MG tablet Take 1 tablet (50 mg total) by mouth every 6 (six) hours as needed. (Patient not taking: Reported on 09/05/2018) 30 tablet 0   No facility-administered medications prior to visit.     ROS: Review of Systems  Constitutional: Negative for activity change, appetite change, chills, fatigue and unexpected weight change.  HENT: Negative for  congestion, mouth sores and sinus pressure.   Eyes: Negative for visual disturbance.  Respiratory: Negative for cough and chest tightness.   Gastrointestinal: Negative for abdominal pain and nausea.  Genitourinary: Negative for difficulty urinating, frequency and vaginal pain.  Musculoskeletal: Negative for back pain and gait problem.  Skin: Positive for rash. Negative for pallor.  Neurological: Negative for dizziness, tremors, weakness, numbness and headaches.  Psychiatric/Behavioral: Negative for confusion and sleep disturbance.    Objective:  BP (!) 172/94 (BP Location: Left Arm, Patient Position: Sitting, Cuff Size: Normal)   Pulse 69   Temp 98.1 F (36.7 C) (Oral)    Ht 5' 3.75" (1.619 m)   Wt 106 lb (48.1 kg)   SpO2 97%   BMI 18.34 kg/m   BP Readings from Last 3 Encounters:  09/05/18 (!) 172/94  04/26/18 (!) 163/83  04/17/18 126/68    Wt Readings from Last 3 Encounters:  09/05/18 106 lb (48.1 kg)  04/26/18 105 lb (47.6 kg)  04/17/18 106 lb (48.1 kg)    Physical Exam Constitutional:      General: She is not in acute distress.    Appearance: She is well-developed.  HENT:     Head: Normocephalic.     Right Ear: External ear normal.     Left Ear: External ear normal.     Nose: Nose normal.  Eyes:     General:        Right eye: No discharge.        Left eye: No discharge.     Conjunctiva/sclera: Conjunctivae normal.     Pupils: Pupils are equal, round, and reactive to light.  Neck:     Musculoskeletal: Normal range of motion and neck supple.     Thyroid: No thyromegaly.     Vascular: No JVD.     Trachea: No tracheal deviation.  Cardiovascular:     Rate and Rhythm: Normal rate and regular rhythm.     Heart sounds: Normal heart sounds.  Pulmonary:     Effort: No respiratory distress.     Breath sounds: No stridor. No wheezing.  Abdominal:     General: Bowel sounds are normal. There is no distension.     Palpations: Abdomen is soft. There is no mass.     Tenderness: There is no abdominal tenderness. There is no guarding or rebound.  Musculoskeletal:        General: No tenderness.  Lymphadenopathy:     Cervical: No cervical adenopathy.  Skin:    Findings: Rash present. No erythema.  Neurological:     Mental Status: She is oriented to person, place, and time.     Cranial Nerves: No cranial nerve deficit.     Motor: No abnormal muscle tone.     Coordination: Coordination normal.     Deep Tendon Reflexes: Reflexes normal.  Psychiatric:        Behavior: Behavior normal.        Thought Content: Thought content normal.        Judgment: Judgment normal.   diffuse papular rash - trunk mostly LS tender  Lab Results  Component  Value Date   WBC 10.4 12/13/2017   HGB 13.6 12/13/2017   HCT 40.9 12/13/2017   PLT 242.0 12/13/2017   GLUCOSE 78 04/26/2018   CHOL 218 (H) 12/13/2017   TRIG 203.0 (H) 12/13/2017   HDL 56.60 12/13/2017   LDLDIRECT 140.0 12/13/2017   LDLCALC 163 (H) 11/07/2015   ALT 12 04/26/2018   AST  18 04/26/2018   NA 140 04/26/2018   K 3.9 04/26/2018   CL 102 04/26/2018   CREATININE 0.81 04/26/2018   BUN 16 04/26/2018   CO2 31 04/26/2018   TSH 1.38 12/13/2017   INR 1.05 01/26/2016    Dg Chest Port 1 View  Result Date: 02/21/2018 CLINICAL DATA:  68 year old female status post bronchoscopy with bronchoalveolar lavage for bronchiectasis, 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 EXAM: PORTABLE CHEST 1 VIEW COMPARISON:  Chest radiographs 12/23/2017 and earlier. FINDINGS: Portable AP semi upright view at 0922 hours. Stable lung volumes and mediastinal contours. Hazy opacity throughout the right upper lobe compatible with sequelae of BAL. No pneumothorax. No other acute pulmonary opacity. Visualized tracheal air column is within normal limits. Mild cardiomegaly and tortuosity of the aorta. No acute osseous abnormality identified. Negative visible bowel gas pattern. IMPRESSION: Hazy opacity in the right upper lobe compatible with bronchoalveolar lavage. No pneumothorax or adverse features status post bronchoscopy. Electronically Signed   By: Genevie Ann M.D.   On: 02/21/2018 09:34    Assessment & Plan:   There are no diagnoses linked to this encounter.   No orders of the defined types were placed in this encounter.    Follow-up: No follow-ups on file.  Walker Kehr, MD

## 2018-09-05 NOTE — Telephone Encounter (Signed)
Please call pt to schedule in office OV

## 2018-09-05 NOTE — Telephone Encounter (Signed)
Can she come for an office visit? Thanks

## 2018-09-05 NOTE — Assessment & Plan Note (Signed)
On B12 

## 2018-09-14 ENCOUNTER — Ambulatory Visit: Payer: Medicare Other

## 2018-09-27 ENCOUNTER — Ambulatory Visit: Payer: Medicare Other | Admitting: Internal Medicine

## 2018-09-29 ENCOUNTER — Telehealth: Payer: Self-pay | Admitting: *Deleted

## 2018-09-29 NOTE — Telephone Encounter (Signed)
Called pt to schedule CT appt before appt w Dr Vaughan Browner on 5/29 pt wants to wait until after appt with DR.

## 2018-10-02 ENCOUNTER — Telehealth: Payer: Self-pay | Admitting: Internal Medicine

## 2018-10-02 ENCOUNTER — Inpatient Hospital Stay: Admission: RE | Admit: 2018-10-02 | Payer: Medicare Other | Source: Ambulatory Visit

## 2018-10-02 NOTE — Telephone Encounter (Signed)
COVID-19 Pre-Screening Questions: ° °Do you currently have a fever (>100 °F), chills or unexplained body aches?no  ° °Are you currently experiencing new cough, shortness of breath, sore throat, runny nose? No  °•  °Have you recently travelled outside the state of [place] in the last 14 days? No   °•  °Have you been in contact with someone that is currently pending confirmation of Covid19 testing or has been confirmed to have the Covid19 virus? No  °

## 2018-10-03 ENCOUNTER — Other Ambulatory Visit: Payer: Self-pay

## 2018-10-03 ENCOUNTER — Encounter: Payer: Self-pay | Admitting: Internal Medicine

## 2018-10-03 ENCOUNTER — Ambulatory Visit (INDEPENDENT_AMBULATORY_CARE_PROVIDER_SITE_OTHER): Payer: Medicare Other | Admitting: Internal Medicine

## 2018-10-03 VITALS — BP 142/74 | HR 62 | Temp 98.0°F | Wt 103.0 lb

## 2018-10-03 DIAGNOSIS — Z5181 Encounter for therapeutic drug level monitoring: Secondary | ICD-10-CM

## 2018-10-03 DIAGNOSIS — A310 Pulmonary mycobacterial infection: Secondary | ICD-10-CM | POA: Diagnosis not present

## 2018-10-03 DIAGNOSIS — J479 Bronchiectasis, uncomplicated: Secondary | ICD-10-CM | POA: Diagnosis not present

## 2018-10-03 NOTE — Assessment & Plan Note (Signed)
She seems to clinically be making good improvements so will continue with treatment for MAI

## 2018-10-03 NOTE — Assessment & Plan Note (Signed)
I will check her LFTs today She is having no vision issues, no rash and no abdominal issues.  Will continue

## 2018-10-03 NOTE — Progress Notes (Signed)
   Subjective:    Patient ID: Lindsay Schultz, female    DOB: 12/04/1950, 68 y.o.   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  Has had issues with burning in her throat, rash last month.  Has had bronchietasis findings on CT since at least 2016 and followed by pulmonary.  More recently has had hemoptysis and increased sputum production and BAL with MAI.  Had not previously been treated.    Review of Systems  Constitutional: Negative for chills and fever.  Gastrointestinal: Negative for abdominal pain and diarrhea.  Skin: Negative for rash.       Objective:   Physical Exam Constitutional:      Appearance: Normal appearance.  Eyes:     General: No scleral icterus. Cardiovascular:     Rate and Rhythm: Normal rate and regular rhythm.  Pulmonary:     Effort: Pulmonary effort is normal. No respiratory distress.     Breath sounds: Normal breath sounds.  Neurological:     Mental Status: She is alert.   SH: no tobacco       Assessment & Plan:

## 2018-10-03 NOTE — Assessment & Plan Note (Signed)
She is doing well on the medication and tolerating.  Some mild nausea but does not keep her from staying on treatment.  Will continue on current medications and have her recheck a sputum, first morning, when she is able to bring it in.

## 2018-10-04 LAB — COMPLETE METABOLIC PANEL WITH GFR
AG Ratio: 1.7 (calc) (ref 1.0–2.5)
ALT: 10 U/L (ref 6–29)
AST: 19 U/L (ref 10–35)
Albumin: 4.1 g/dL (ref 3.6–5.1)
Alkaline phosphatase (APISO): 90 U/L (ref 37–153)
BUN: 13 mg/dL (ref 7–25)
CO2: 30 mmol/L (ref 20–32)
Calcium: 9.3 mg/dL (ref 8.6–10.4)
Chloride: 103 mmol/L (ref 98–110)
Creat: 0.75 mg/dL (ref 0.50–0.99)
GFR, Est African American: 96 mL/min/{1.73_m2} (ref >=60)
GFR, Est Non African American: 82 mL/min/{1.73_m2} (ref >=60)
Globulin: 2.4 g/dL (calc) (ref 1.9–3.7)
Glucose, Bld: 84 mg/dL (ref 65–99)
Potassium: 3.7 mmol/L (ref 3.5–5.3)
Sodium: 142 mmol/L (ref 135–146)
Total Bilirubin: 0.6 mg/dL (ref 0.2–1.2)
Total Protein: 6.5 g/dL (ref 6.1–8.1)

## 2018-10-16 ENCOUNTER — Other Ambulatory Visit: Payer: Self-pay

## 2018-10-16 ENCOUNTER — Ambulatory Visit (INDEPENDENT_AMBULATORY_CARE_PROVIDER_SITE_OTHER): Payer: Medicare Other | Admitting: Pulmonary Disease

## 2018-10-16 ENCOUNTER — Encounter: Payer: Self-pay | Admitting: Pulmonary Disease

## 2018-10-16 DIAGNOSIS — J479 Bronchiectasis, uncomplicated: Secondary | ICD-10-CM

## 2018-10-16 DIAGNOSIS — A310 Pulmonary mycobacterial infection: Secondary | ICD-10-CM

## 2018-10-16 NOTE — Patient Instructions (Signed)
I am glad you are doing well with your breathing We will go ahead and get the CT scan scheduled Follow-up in 6 months. Please call us sooner if there is any change in your symptoms.

## 2018-10-16 NOTE — Progress Notes (Signed)
Lindsay Schultz    [id number]    07/18/1950  Primary Care Physician:Plotnikov, Evie Lacks, MD  Referring Physician: Cassandria Anger, MD [place],  [place] [id number]  Chief complaint:   Follow up for Bronchiectasis with MAI infection Sinusitis  Seasonal allergies GERD  HPI: Lindsay Schultz is a 68 year old female with history of asthmatic bronchitis, sinusitis, and seasonal allergies.  She's had a workup for her bronchiectasis including CBC, Ig levels, sputum cultures, A1AT levels, RF which were normal. PFTs do not show any obstruction and very mild restriction and reduction in diffusion capacity. Her husband has recently diagnosed with colon cancer s/p resection and chemo in 2017. Lindsay Schultz was stressed during all this but he is doing better now with cancer in remission.  She has attacks of bronchiectasis exacerbations about once a year. She states that the infection usually begins in the sinus and then moves into the lung. This usually resolves after course of antibiotics. She has H/O seasonal allergies (worst in fall and spring) and asthmatic bronchitis. Never smoked.  Underwent bronchoscopy on 12/22/2017 with cultures growing MAI She has been seen by ID and started on antibiotic therapy  Interim history: This is a tele-visit follow-up Lindsay Schultz is currently on treatment for MAI with Dr. De Burrs She is tolerating it well with no issues next States that breathing is doing well.  Has chronic cough with sputum production. Using the chest vest for clearance of secretion.  Outpatient Encounter Medications as of 10/16/2018  Medication Sig  . acetaminophen (TYLENOL) 500 MG tablet Take 500-1,000 mg by mouth every 6 (six) hours as needed (for pain.).  Marland Kitchen albuterol (PROVENTIL HFA;VENTOLIN HFA) 108 (90 Base) MCG/ACT inhaler Inhale 2 puffs into the lungs every 6 (six) hours as needed for wheezing or shortness of breath.  Marland Kitchen azithromycin (ZITHROMAX) 500 MG tablet Take 1 tablet (500 mg  total) by mouth 3 (three) times a week.  . calcitonin, salmon, (MIACALCIN/FORTICAL) 200 UNIT/ACT nasal spray Place 1 spray into alternate nostrils daily.  . Carboxymethylcellulose Sodium (THERATEARS) 0.25 % SOLN Place 1 drop into both eyes 3 (three) times daily as needed (for dry/irritated eyes.).  Marland Kitchen cholecalciferol (VITAMIN D) 1000 units tablet Take 1,000 Units by mouth 2 (two) times daily.  Marland Kitchen CRANBERRY PO Take 1 capsule by mouth every evening.  . Cyanocobalamin (VITAMIN B-12) 500 MCG SUBL 1 tab sl qd (Patient taking differently: Take 500 mcg by mouth 2 (two) times daily. )  . ethambutol (MYAMBUTOL) 400 MG tablet Take 3 tablets (1,200 mg total) by mouth 3 (three) times a week.  . famotidine (PEPCID) 20 MG tablet Take 20 mg by mouth daily.  . fluticasone (FLONASE) 50 MCG/ACT nasal spray Place 1 spray into both nostrils daily as needed. (Patient taking differently: Place 1 spray into both nostrils daily. )  . Lactobacillus (ACIDOPHILUS PO) Take 1 capsule by mouth every evening.  . Multiple Vitamins-Minerals (MULTIVITAMIN WITH MINERALS) tablet Take 1 tablet by mouth every evening.   . nadolol (CORGARD) 40 MG tablet Take 0.5 tablets (20 mg total) by mouth 2 (two) times daily.  . ondansetron (ZOFRAN ODT) 4 MG disintegrating tablet Take 1 tablet (4 mg total) by mouth every 8 (eight) hours as needed for nausea or vomiting.  . pantoprazole (PROTONIX) 40 MG tablet Take 1 tablet (40 mg total) by mouth daily.  Marland Kitchen pyridOXINE (VITAMIN B-6) 100 MG tablet Take 100 mg by mouth 2 (two) times daily.  . rifampin (  RIFADIN) 300 MG capsule Take 2 capsules (600 mg total) by mouth 3 (three) times a week.  . triamcinolone cream (KENALOG) 0.1 % Apply 1 application topically 4 (four) times daily.  . [DISCONTINUED] fluconazole (DIFLUCAN) 200 MG tablet Take 1 tablet (200 mg total) by mouth daily.  . [DISCONTINUED] magic mouthwash w/lidocaine SOLN Take 5 mLs by mouth 4 (four) times daily as needed. (Patient not taking: Reported  on 10/16/2018)  . [DISCONTINUED] traMADol (ULTRAM) 50 MG tablet Take 1 tablet (50 mg total) by mouth every 6 (six) hours as needed.   No facility-administered encounter medications on file as of 10/16/2018.    Physical Exam: No physical examination done Televisit  Data Reviewed: Imaging CT scan 11/15/14-Multiple nodules in the left upper lobe, bilateral bronchiectasis. CT scan 01/24/16 Stable pulmonary nodules, bilateral bronchiectasis. Left upper lobe ground glass opacities. Images reviewed. CT scan 11/16/2017- stable pulmonary nodules, bilateral bronchiectasis, new area of opacity in the right upper lobe consistent with mucus impaction.  I have reviewed the images personally  Labs 12/09/14 IgG-773 IgG 8-2 37 IgM-64 IgE-56 Rheumatoid factor < 10 All within normal limits.  AFB and fungal cultures 11/19/14- no acid-fast bacilli, Candida albicans. 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 Repeat sputum AFB 01/05/2018-negative  Bronchoscopy 02/21/2018 Cultures- Mycobacterium avium complex BAL cell count-WBC 635, 9% lymphs, 1% eos, 76% neutrophils  PFTs  02/10/15 FVC 2.35 (74%), FEV1 1.79 (74%), F/F 77, TLC 79%, DLCO 78% No obstruction. Mild reduction in lung volumes and diffusion capacity  Assessment:  Bronchiectasis, MAC infection Started on treatment with antibiotics per ID. Continue chest percussion with vest for mucociliary clearance.  Abnormal CT scan with lung nodules Likely benign as most of them are remained stable.  Her last CT scan from 7/31 shows new area of opacity in the right upper lobe which is consistent with mucoid impaction. Follow-up CT scan.  Asthmatic  Bronchitis She is asymptomatic with symptoms mostly during spring and fall allergy. She'll continue the albuterol when necessary.  GERD Continue Zantac.  Health maintenance 12/13/2017-influenza 02/07/2016-Prevnar 05/28/2014-Pneumovax  Plan/Recommendations: - Treatment for MAI per  infectious disease - Continue albuterol as needed - Percussion vest - Follow-up CT  Marshell Garfinkel MD [facility] 10/16/2018, 10:37 AM  CC: Plotnikov, Evie Lacks, MD

## 2018-10-30 ENCOUNTER — Other Ambulatory Visit: Payer: Self-pay

## 2018-10-30 ENCOUNTER — Other Ambulatory Visit: Payer: Medicare Other

## 2018-10-30 ENCOUNTER — Ambulatory Visit
Admission: RE | Admit: 2018-10-30 | Discharge: 2018-10-30 | Disposition: A | Discharge status: Home / Self Care | Payer: Medicare Other | Source: Ambulatory Visit | Attending: Internal Medicine | Admitting: Internal Medicine

## 2018-10-30 DIAGNOSIS — A310 Pulmonary mycobacterial infection: Secondary | ICD-10-CM

## 2018-10-30 DIAGNOSIS — Z1231 Encounter for screening mammogram for malignant neoplasm of breast: Secondary | ICD-10-CM

## 2018-11-09 ENCOUNTER — Ambulatory Visit: Payer: Medicare Other | Admitting: Internal Medicine

## 2018-11-15 ENCOUNTER — Telehealth: Payer: Self-pay | Admitting: *Deleted

## 2018-11-15 NOTE — Telephone Encounter (Signed)
Correct, thanks.

## 2018-11-15 NOTE — Telephone Encounter (Signed)
-----   Message from [place] sent at 11/15/2018  1:37 PM EDT ----- Regarding: Postive AFB Contact: Lindsay Schultz [facility])- [phone] Positive AFB- Lindsay from the [facility] called saying that they just received a positive AFB on this patient. Said that she would like to know what the provider has in mind to treat/follow up with patient.  Whether a Culture has been ordered or PCR

## 2018-11-15 NOTE — Telephone Encounter (Signed)
Returned April's call.  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 This is not tuberculosis.  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 Landis Gandy, RN

## 2018-11-17 ENCOUNTER — Ambulatory Visit (INDEPENDENT_AMBULATORY_CARE_PROVIDER_SITE_OTHER)
Admission: RE | Admit: 2018-11-17 | Discharge: 2018-11-17 | Disposition: A | Discharge status: Home / Self Care | Payer: Medicare Other | Source: Ambulatory Visit | Attending: Pulmonary Disease | Admitting: Pulmonary Disease

## 2018-11-17 ENCOUNTER — Other Ambulatory Visit: Payer: Self-pay

## 2018-11-17 DIAGNOSIS — J471 Bronchiectasis with (acute) exacerbation: Secondary | ICD-10-CM | POA: Diagnosis not present

## 2018-11-20 ENCOUNTER — Encounter: Payer: Self-pay | Admitting: Internal Medicine

## 2018-11-20 ENCOUNTER — Other Ambulatory Visit (INDEPENDENT_AMBULATORY_CARE_PROVIDER_SITE_OTHER): Payer: Medicare Other

## 2018-11-20 ENCOUNTER — Other Ambulatory Visit: Payer: Self-pay

## 2018-11-20 ENCOUNTER — Ambulatory Visit (INDEPENDENT_AMBULATORY_CARE_PROVIDER_SITE_OTHER): Payer: Medicare Other | Admitting: Internal Medicine

## 2018-11-20 VITALS — BP 160/90 | HR 59 | Temp 98.5°F | Ht 63.75 in | Wt 102.0 lb

## 2018-11-20 DIAGNOSIS — E559 Vitamin D deficiency, unspecified: Secondary | ICD-10-CM

## 2018-11-20 DIAGNOSIS — R634 Abnormal weight loss: Secondary | ICD-10-CM

## 2018-11-20 DIAGNOSIS — A310 Pulmonary mycobacterial infection: Secondary | ICD-10-CM

## 2018-11-20 DIAGNOSIS — E538 Deficiency of other specified B group vitamins: Secondary | ICD-10-CM | POA: Diagnosis not present

## 2018-11-20 DIAGNOSIS — Q782 Osteopetrosis: Secondary | ICD-10-CM

## 2018-11-20 LAB — LIPID PANEL
Cholesterol: 264 mg/dL — ABNORMAL HIGH (ref 0–200)
HDL: 56.2 mg/dL (ref >39.00)
LDL Cholesterol: 169 mg/dL — ABNORMAL HIGH (ref 0–99)
NonHDL: 207.76
Total CHOL/HDL Ratio: 5
Triglycerides: 193 mg/dL — ABNORMAL HIGH (ref 0.0–149.0)
VLDL: 38.6 mg/dL (ref 0.0–40.0)

## 2018-11-20 LAB — HEPATIC FUNCTION PANEL
ALT: 11 U/L (ref 0–35)
AST: 18 U/L (ref 0–37)
Albumin: 4.3 g/dL (ref 3.5–5.2)
Alkaline Phosphatase: 90 U/L (ref 39–117)
Bilirubin, Direct: 0 mg/dL (ref 0.0–0.3)
Total Bilirubin: 0.3 mg/dL (ref 0.2–1.2)
Total Protein: 6.8 g/dL (ref 6.0–8.3)

## 2018-11-20 LAB — VITAMIN D 25 HYDROXY (VIT D DEFICIENCY, FRACTURES): VITD: 41.02 ng/mL (ref 30.00–100.00)

## 2018-11-20 LAB — URINALYSIS
Bilirubin Urine: NEGATIVE
Hgb urine dipstick: NEGATIVE
Ketones, ur: NEGATIVE
Leukocytes,Ua: NEGATIVE
Nitrite: NEGATIVE
Specific Gravity, Urine: 1.02 (ref 1.000–1.030)
Total Protein, Urine: NEGATIVE
Urine Glucose: NEGATIVE
Urobilinogen, UA: 0.2 (ref 0.0–1.0)
pH: 7 (ref 5.0–8.0)

## 2018-11-20 LAB — BASIC METABOLIC PANEL
BUN: 10 mg/dL (ref 6–23)
CO2: 32 mEq/L (ref 19–32)
Calcium: 9.5 mg/dL (ref 8.4–10.5)
Chloride: 102 mEq/L (ref 96–112)
Creatinine, Ser: 0.72 mg/dL (ref 0.40–1.20)
GFR: 80.65 mL/min (ref >60.00)
Glucose, Bld: 84 mg/dL (ref 70–99)
Potassium: 3.6 mEq/L (ref 3.5–5.1)
Sodium: 141 mEq/L (ref 135–145)

## 2018-11-20 LAB — TSH: TSH: 1.15 u[IU]/mL (ref 0.35–4.50)

## 2018-11-20 LAB — CBC WITH DIFFERENTIAL/PLATELET
Basophils Absolute: 0 10*3/uL (ref 0.0–0.1)
Basophils Relative: 0.5 % (ref 0.0–3.0)
Eosinophils Absolute: 0.1 10*3/uL (ref 0.0–0.7)
Eosinophils Relative: 1.7 % (ref 0.0–5.0)
HCT: 37.3 % (ref 36.0–46.0)
Hemoglobin: 12.6 g/dL (ref 12.0–15.0)
Lymphocytes Relative: 29.1 % (ref 12.0–46.0)
Lymphs Abs: 1.5 10*3/uL (ref 0.7–4.0)
MCHC: 33.7 g/dL (ref 30.0–36.0)
MCV: 89.4 fl (ref 78.0–100.0)
Monocytes Absolute: 0.6 10*3/uL (ref 0.1–1.0)
Monocytes Relative: 10.7 % (ref 3.0–12.0)
Neutro Abs: 3 10*3/uL (ref 1.4–7.7)
Neutrophils Relative %: 58 % (ref 43.0–77.0)
Platelets: 188 10*3/uL (ref 150.0–400.0)
RBC: 4.18 Mil/uL (ref 3.87–5.11)
RDW: 14 % (ref 11.5–15.5)
WBC: 5.2 10*3/uL (ref 4.0–10.5)

## 2018-11-20 LAB — VITAMIN B12: Vitamin B-12: 913 pg/mL — ABNORMAL HIGH (ref 211–911)

## 2018-11-20 MED ORDER — PANTOPRAZOLE SODIUM 40 MG PO TBEC: 40.0000 mg | DELAYED_RELEASE_TABLET | Freq: Every day | ORAL | 11 refills | Status: DC

## 2018-11-20 MED ORDER — CALCITONIN (SALMON) 200 UNIT/ACT NA SOLN: 1.0000 | Freq: Every day | NASAL | 11 refills | Status: DC

## 2018-11-20 MED ORDER — FLUTICASONE PROPIONATE 50 MCG/ACT NA SUSP: 1.0000 | Freq: Every day | NASAL | 11 refills | Status: DC | PRN

## 2018-11-20 MED ORDER — NADOLOL 40 MG PO TABS: 20.0000 mg | ORAL_TABLET | Freq: Two times a day (BID) | ORAL | 11 refills | Status: DC

## 2018-11-20 NOTE — Progress Notes (Signed)
Subjective:  Patient ID: Lindsay Schultz, female    DOB: 1950/08/27  Age: 68 y.o. MRN: [id number]  CC: No chief complaint on file.   HPI Lindsay Schultz presents for MAC  Infection, bronchiectases, HTN BP ok at home  Outpatient Medications Prior to Visit  Medication Sig Dispense Refill   acetaminophen (TYLENOL) 500 MG tablet Take 500-1,000 mg by mouth every 6 (six) hours as needed (for pain.).     albuterol (PROVENTIL HFA;VENTOLIN HFA) 108 (90 Base) MCG/ACT inhaler Inhale 2 puffs into the lungs every 6 (six) hours as needed for wheezing or shortness of breath. 1 Inhaler 3   azithromycin (ZITHROMAX) 500 MG tablet Take 1 tablet (500 mg total) by mouth 3 (three) times a week. 12 tablet 11   calcitonin, salmon, (MIACALCIN/FORTICAL) 200 UNIT/ACT nasal spray Place 1 spray into alternate nostrils daily. 3.7 mL 11   Carboxymethylcellulose Sodium (THERATEARS) 0.25 % SOLN Place 1 drop into both eyes 3 (three) times daily as needed (for dry/irritated eyes.).     cholecalciferol (VITAMIN D) 1000 units tablet Take 1,000 Units by mouth 2 (two) times daily.     CRANBERRY PO Take 1 capsule by mouth every evening.     Cyanocobalamin (VITAMIN B-12) 500 MCG SUBL 1 tab sl qd (Patient taking differently: Take 500 mcg by mouth 2 (two) times daily. ) 100 tablet 11   ethambutol (MYAMBUTOL) 400 MG tablet Take 3 tablets (1,200 mg total) by mouth 3 (three) times a week. 36 tablet 11   famotidine (PEPCID) 20 MG tablet Take 20 mg by mouth daily.     fluticasone (FLONASE) 50 MCG/ACT nasal spray Place 1 spray into both nostrils daily as needed. (Patient taking differently: Place 1 spray into both nostrils daily. ) 16 g 11   Lactobacillus (ACIDOPHILUS PO) Take 1 capsule by mouth every evening.     Multiple Vitamins-Minerals (MULTIVITAMIN WITH MINERALS) tablet Take 1 tablet by mouth every evening.      nadolol (CORGARD) 40 MG tablet Take 0.5 tablets (20 mg total) by mouth 2 (two) times daily. 30 tablet 11    ondansetron (ZOFRAN ODT) 4 MG disintegrating tablet Take 1 tablet (4 mg total) by mouth every 8 (eight) hours as needed for nausea or vomiting. 30 tablet 5   pantoprazole (PROTONIX) 40 MG tablet Take 1 tablet (40 mg total) by mouth daily. 30 tablet 11   pyridOXINE (VITAMIN B-6) 100 MG tablet Take 100 mg by mouth 2 (two) times daily.     rifampin (RIFADIN) 300 MG capsule Take 2 capsules (600 mg total) by mouth 3 (three) times a week. 24 capsule 11   triamcinolone cream (KENALOG) 0.1 % Apply 1 application topically 4 (four) times daily. 160 g 1   No facility-administered medications prior to visit.     ROS: Review of Systems  Constitutional: Negative for activity change, appetite change, chills, fatigue and unexpected weight change.  HENT: Negative for congestion, mouth sores and sinus pressure.   Eyes: Negative for visual disturbance.  Respiratory: Negative for cough and chest tightness.   Gastrointestinal: Negative for abdominal pain and nausea.  Genitourinary: Negative for difficulty urinating, frequency and vaginal pain.  Musculoskeletal: Negative for back pain and gait problem.  Skin: Negative for pallor and rash.  Neurological: Negative for dizziness, tremors, weakness, numbness and headaches.  Psychiatric/Behavioral: Negative for confusion, sleep disturbance and suicidal ideas.    Objective:  BP (!) 180/90 (BP Location: Left Arm, Patient Position: Sitting, Cuff Size: Normal)  Pulse (!) 59    Temp 98.5 F (36.9 C) (Oral)    Ht 5' 3.75" (1.619 m)    Wt 102 lb (46.3 kg)    SpO2 96%    BMI 17.65 kg/m   BP Readings from Last 3 Encounters:  11/20/18 (!) 180/90  10/03/18 (!) 142/74  09/05/18 (!) 172/94    Wt Readings from Last 3 Encounters:  11/20/18 102 lb (46.3 kg)  10/03/18 103 lb (46.7 kg)  09/05/18 106 lb (48.1 kg)    Physical Exam Constitutional:      General: She is not in acute distress.    Appearance: She is well-developed.  HENT:     Head: Normocephalic.      Right Ear: External ear normal.     Left Ear: External ear normal.     Nose: Nose normal.  Eyes:     General:        Right eye: No discharge.        Left eye: No discharge.     Conjunctiva/sclera: Conjunctivae normal.     Pupils: Pupils are equal, round, and reactive to light.  Neck:     Musculoskeletal: Normal range of motion and neck supple.     Thyroid: No thyromegaly.     Vascular: No JVD.     Trachea: No tracheal deviation.  Cardiovascular:     Rate and Rhythm: Normal rate and regular rhythm.     Heart sounds: Normal heart sounds.  Pulmonary:     Effort: No respiratory distress.     Breath sounds: No stridor. No wheezing.  Abdominal:     General: Bowel sounds are normal. There is no distension.     Palpations: Abdomen is soft. There is no mass.     Tenderness: There is no abdominal tenderness. There is no guarding or rebound.  Musculoskeletal:        General: No tenderness.  Lymphadenopathy:     Cervical: No cervical adenopathy.  Skin:    Findings: No erythema or rash.  Neurological:     Mental Status: She is oriented to person, place, and time.     Cranial Nerves: No cranial nerve deficit.     Motor: No abnormal muscle tone.     Coordination: Coordination normal.     Deep Tendon Reflexes: Reflexes normal.  Psychiatric:        Behavior: Behavior normal.        Thought Content: Thought content normal.        Judgment: Judgment normal.     Lab Results  Component Value Date   WBC 10.4 12/13/2017   HGB 13.6 12/13/2017   HCT 40.9 12/13/2017   PLT 242.0 12/13/2017   GLUCOSE 84 10/03/2018   CHOL 218 (H) 12/13/2017   TRIG 203.0 (H) 12/13/2017   HDL 56.60 12/13/2017   LDLDIRECT 140.0 12/13/2017   LDLCALC 163 (H) 11/07/2015   ALT 10 10/03/2018   AST 19 10/03/2018   NA 142 10/03/2018   K 3.7 10/03/2018   CL 103 10/03/2018   CREATININE 0.75 10/03/2018   BUN 13 10/03/2018   CO2 30 10/03/2018   TSH 1.38 12/13/2017   INR 1.05 01/26/2016    Ct Chest Wo  Contrast  Result Date: 11/17/2018 CLINICAL DATA:  Follow-up bronchiectasis. EXAM: CT CHEST WITHOUT CONTRAST TECHNIQUE: Multidetector CT imaging of the chest was performed following the standard protocol without IV contrast. COMPARISON:  11/16/17 FINDINGS: Cardiovascular: Normal heart size. No pericardial effusion. Aortic atherosclerosis. Lad coronary artery  and RCA coronary artery calcifications. Mediastinum/Nodes: Left hemithyroidectomy. The trachea appears patent and is midline. Normal appearance of the esophagus. No enlarged mediastinal or hilar lymph nodes. Lungs/Pleura: No pleural effusion or airspace consolidation, atelectasis or pneumothorax. 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 The previously referenced left upper lobe lung nodule measures 5 mm, image 64/3. Previously 6 mm. Calcified granuloma in the left lower lobe. New round peribronchovascular nodule in the right lower lobe measures 7 mm, image 76/3. Upper Abdomen: No acute abnormality. Musculoskeletal: No chest wall mass or suspicious bone lesions identified. IMPRESSION: 1. 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 2. Stable index nodule in the left upper lobe measuring 5 mm consistent with a benign abnormality. 3. New round solid nodule in the right lower lobe is none specific measuring 7 mm. Although likely postinflammatory follow-up imaging is recommended. Non-contrast chest CT at 6-12 months is recommended. If the nodule is stable at time of repeat CT, then future CT at 18-24 months (from today's scan) is considered optional for low-risk patients, but is recommended for high-risk patients. This recommendation follows the consensus statement: Guidelines for Management of Incidental Pulmonary Nodules Detected on CT Images: From the Fleischner  Society 2017; Radiology 2017; 284:228-243. 4. Aortic Atherosclerosis (ICD10-I70.0). Coronary artery calcifications. Electronically Signed   By: Kerby Moors M.D.   On: 11/17/2018 16:01    Assessment & Plan:   There are no diagnoses linked to this encounter.   No orders of the defined types were placed in this encounter.    Follow-up: No follow-ups on file.  Walker Kehr, MD

## 2018-11-20 NOTE — Assessment & Plan Note (Signed)
Miacalcin Vit D

## 2018-11-20 NOTE — Patient Instructions (Signed)
If you have medicare related insurance (such as traditional Medicare, Blue Cross Medicare, United HealthCare Medicare, or similar), Please make an appointment at the scheduling desk with Jill, the Wellness Health Coach, for your Wellness visit in this office, which is a benefit with your insurance.  

## 2018-11-20 NOTE — Assessment & Plan Note (Signed)
On abx

## 2018-11-20 NOTE — Assessment & Plan Note (Signed)
Wt Readings from Last 3 Encounters:  11/20/18 102 lb (46.3 kg)  10/03/18 103 lb (46.7 kg)  09/05/18 106 lb (48.1 kg)

## 2018-11-20 NOTE — Assessment & Plan Note (Signed)
On B12 

## 2018-11-21 ENCOUNTER — Telehealth: Payer: Self-pay | Admitting: *Deleted

## 2018-11-21 NOTE — Telephone Encounter (Signed)
[facility] called with results of the patient recent lab 10/30/18 Acid-fast bacilli isolated. Identification is in progress. DNA probe result negative for M. tuberculosis complex (M. tuberculosis, M. bovis, M. bovis BCG, M. africanum, M. microti, and M. canetti  Advised will let provider know.

## 2018-11-27 ENCOUNTER — Telehealth: Payer: Self-pay

## 2018-11-27 NOTE — Telephone Encounter (Signed)
MYCOBACTERIA, CULTURE, WITH FLUOROCHROME SMEAR 10/30/18  STATUS: PRELIMINARY P   SMEAR: No acid fast bacilli seen. P   ISOLATE 1: Mycobacterium, avium-intracellulare group Abnormal  P   Comment: DNA probe result positive for Mycobacterium avium complex DNA probe result negative for M. tuberculosis complex (M. tuberculosis, M. bovis, M. bovis BCG, M. africanum, M. microti, and M. canetti)   Routing to provider also place copy of lab in providers box. Eugenia Mcalpine, LPN

## 2018-12-13 LAB — MYCOBACTERIA,CULT W/FLUOROCHROME SMEAR
MICRO NUMBER:: 662046
SMEAR:: NONE SEEN
SPECIMEN QUALITY:: ADEQUATE

## 2018-12-19 ENCOUNTER — Other Ambulatory Visit: Payer: Self-pay | Admitting: Pulmonary Disease

## 2018-12-19 DIAGNOSIS — R911 Solitary pulmonary nodule: Secondary | ICD-10-CM

## 2019-02-05 ENCOUNTER — Encounter: Payer: Self-pay | Admitting: Internal Medicine

## 2019-02-05 ENCOUNTER — Ambulatory Visit (INDEPENDENT_AMBULATORY_CARE_PROVIDER_SITE_OTHER): Payer: Medicare Other | Admitting: Internal Medicine

## 2019-02-05 ENCOUNTER — Other Ambulatory Visit: Payer: Self-pay

## 2019-02-05 VITALS — BP 148/77 | HR 67 | Temp 98.0°F | Wt 102.2 lb

## 2019-02-05 DIAGNOSIS — A310 Pulmonary mycobacterial infection: Secondary | ICD-10-CM

## 2019-02-05 DIAGNOSIS — Z5181 Encounter for therapeutic drug level monitoring: Secondary | ICD-10-CM | POA: Diagnosis not present

## 2019-02-05 DIAGNOSIS — J479 Bronchiectasis, uncomplicated: Secondary | ICD-10-CM | POA: Diagnosis not present

## 2019-02-05 MED ORDER — ETHAMBUTOL HCL 400 MG PO TABS: 1200.0000 mg | ORAL_TABLET | ORAL | 11 refills | Status: DC

## 2019-02-05 MED ORDER — VITAMIN B-6 100 MG PO TABS: 100.0000 mg | ORAL_TABLET | Freq: Every day | ORAL | 11 refills | Status: DC

## 2019-02-05 MED ORDER — RIFAMPIN 300 MG PO CAPS: 600.0000 mg | ORAL_CAPSULE | ORAL | 11 refills | Status: DC

## 2019-02-05 MED ORDER — AZITHROMYCIN 500 MG PO TABS: 500.0000 mg | ORAL_TABLET | ORAL | 11 refills | Status: DC

## 2019-02-05 NOTE — Assessment & Plan Note (Addendum)
Doing well on medications but last sputum still positive.  Will repeat again now.  Refills sent to her pharmacy.  rtc 3 months

## 2019-02-05 NOTE — Assessment & Plan Note (Signed)
Cough, sputum remains improved. Continue to monitor

## 2019-02-05 NOTE — Assessment & Plan Note (Signed)
Will check her LFTs and hgb today on her medication

## 2019-02-05 NOTE — Progress Notes (Signed)
   Subjective:    Patient ID: Lindsay Schultz, female    DOB: [birth year], 68 y.o.   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   Has been on 3 drug therapy with rifampin/azithromycin/ethambutol since December 2019.       Review of Systems  Constitutional: Negative for chills and fever.  Gastrointestinal: Negative for abdominal pain and diarrhea.  Skin: Negative for rash.       Objective:   Physical Exam Constitutional:      Appearance: Normal appearance.  Eyes:     General: No scleral icterus. Cardiovascular:     Rate and Rhythm: Normal rate and regular rhythm.  Pulmonary:     Effort: Pulmonary effort is normal. No respiratory distress.     Breath sounds: Normal breath sounds.  Neurological:     Mental Status: She is alert.  Psychiatric:        Mood and Affect: Mood normal.   SH: no tobacco       Assessment & Plan:

## 2019-02-06 LAB — COMPLETE METABOLIC PANEL WITH GFR
AG Ratio: 2 (calc) (ref 1.0–2.5)
ALT: 9 U/L (ref 6–29)
AST: 21 U/L (ref 10–35)
Albumin: 4 g/dL (ref 3.6–5.1)
Alkaline phosphatase (APISO): 78 U/L (ref 37–153)
BUN: 9 mg/dL (ref 7–25)
CO2: 30 mmol/L (ref 20–32)
Calcium: 9.2 mg/dL (ref 8.6–10.4)
Chloride: 105 mmol/L (ref 98–110)
Creat: 0.8 mg/dL (ref 0.50–0.99)
GFR, Est African American: 88 mL/min/{1.73_m2} (ref >=60)
GFR, Est Non African American: 76 mL/min/{1.73_m2} (ref >=60)
Globulin: 2 g/dL (calc) (ref 1.9–3.7)
Glucose, Bld: 128 mg/dL — ABNORMAL HIGH (ref 65–99)
Potassium: 3.7 mmol/L (ref 3.5–5.3)
Sodium: 143 mmol/L (ref 135–146)
Total Bilirubin: 0.3 mg/dL (ref 0.2–1.2)
Total Protein: 6 g/dL — ABNORMAL LOW (ref 6.1–8.1)

## 2019-02-06 LAB — CBC
HCT: 38.1 % (ref 35.0–45.0)
Hemoglobin: 12.9 g/dL (ref 11.7–15.5)
MCH: 30.9 pg (ref 27.0–33.0)
MCHC: 33.9 g/dL (ref 32.0–36.0)
MCV: 91.4 fL (ref 80.0–100.0)
MPV: 10.4 fL (ref 7.5–12.5)
Platelets: 166 10*3/uL (ref 140–400)
RBC: 4.17 10*6/uL (ref 3.80–5.10)
RDW: 12.5 % (ref 11.0–15.0)
WBC: 4.8 10*3/uL (ref 3.8–10.8)

## 2019-02-12 ENCOUNTER — Other Ambulatory Visit: Payer: Medicare Other

## 2019-02-12 ENCOUNTER — Other Ambulatory Visit: Payer: Self-pay

## 2019-02-12 DIAGNOSIS — A310 Pulmonary mycobacterial infection: Secondary | ICD-10-CM

## 2019-02-23 ENCOUNTER — Telehealth: Payer: Self-pay | Admitting: *Deleted

## 2019-02-23 NOTE — Telephone Encounter (Signed)
[facility] called with urgent lab results for sputum.  Identification to follow. Please advise if you would like sensitivities on this sample. Call back is [phone].  Sample reference is [id number] L. 11d ago   MICRO NUMBER: [id number] P   SPECIMEN QUALITY: Adequate P   Source: SPUTUM P   STATUS: PRELIMINARY P   SMEAR: No acid fast bacilli seen. P   ISOLATE 1: acid-fast bacillus Abnormal  P   Comment: Acid-fast bacilli present in liquid culture media. Identification to follow.    Landis Gandy, RN

## 2019-02-23 NOTE — Telephone Encounter (Signed)
Yes, lets do sensitivities.  thanks

## 2019-02-23 NOTE — Telephone Encounter (Signed)
Relayed request for sensitivities to [facility] at [place]. Thanks!

## 2019-03-08 ENCOUNTER — Telehealth: Payer: Self-pay | Admitting: *Deleted

## 2019-03-08 NOTE — Telephone Encounter (Signed)
Patient called to report that she has not gotten any results from her labs 02/12/19. Advised her there were sensitivities added to them after resulting and once we have those the provider will give her a call with next steps. She was happy to hear that we did not forget about her. Assured her we will be calling once the results come in.

## 2019-03-09 NOTE — Telephone Encounter (Signed)
Yes, sorry. It was positive again.  Her liver enzymes were fine.  She will just be continuing with the same medications.  This will extend the course a bit.  thanks

## 2019-03-09 NOTE — Telephone Encounter (Signed)
Called the patient give answer from [place] and to continue the medication. Reminded her of her follow up appt 05/21/19.

## 2019-04-07 LAB — MYCOBACTERIA,CULT W/FLUOROCHROME SMEAR
MICRO NUMBER:: 1030257
SMEAR:: NONE SEEN
SPECIMEN QUALITY:: ADEQUATE

## 2019-04-07 LAB — M. AVIUM MIC PANEL
AMIKACIN: 16
CIPROFLOXACIN: 16
CLARITHROMYCIN: 4
ETHAMBUTOL: 8
ETHIONAMIDE: 1.2
ISONIAZID: 4
LINEZOLID: 64
MOXIFLOXACIN: 4
RIFABUTIN: 0.5
RIFAMPIN: >8
STREPTOMYCIN: 64

## 2019-04-09 ENCOUNTER — Telehealth: Payer: Self-pay | Admitting: *Deleted

## 2019-04-09 NOTE — Telephone Encounter (Signed)
Patient's husband tested positive for Covid, tested 12/18 but results today. She ran a fever over the weekend, feels she is likely positive as well.  SHe feels ok, but has a cough that keeps her up all night.  She is asking for advice on if something over the counter would be safe/effective, as she also is currently on treatment for MAC and has bronchiectasis. Landis Gandy, RN

## 2019-04-10 NOTE — Telephone Encounter (Signed)
Anything like Robitussin will be fine.

## 2019-04-11 NOTE — Telephone Encounter (Signed)
Relayed to patient. Thanks! 

## 2019-05-21 ENCOUNTER — Encounter: Payer: Self-pay | Admitting: Internal Medicine

## 2019-05-21 ENCOUNTER — Other Ambulatory Visit: Payer: Self-pay

## 2019-05-21 ENCOUNTER — Ambulatory Visit (INDEPENDENT_AMBULATORY_CARE_PROVIDER_SITE_OTHER): Payer: Medicare Other | Admitting: Internal Medicine

## 2019-05-21 VITALS — BP 176/86 | HR 56 | Temp 97.5°F | Ht 63.0 in | Wt 99.0 lb

## 2019-05-21 DIAGNOSIS — J479 Bronchiectasis, uncomplicated: Secondary | ICD-10-CM | POA: Diagnosis not present

## 2019-05-21 DIAGNOSIS — A310 Pulmonary mycobacterial infection: Secondary | ICD-10-CM

## 2019-05-21 LAB — COMPLETE METABOLIC PANEL WITH GFR
AG Ratio: 2 (calc) (ref 1.0–2.5)
ALT: 9 U/L (ref 6–29)
AST: 16 U/L (ref 10–35)
Albumin: 4.3 g/dL (ref 3.6–5.1)
Alkaline phosphatase (APISO): 85 U/L (ref 37–153)
BUN: 7 mg/dL (ref 7–25)
CO2: 32 mmol/L (ref 20–32)
Calcium: 9.1 mg/dL (ref 8.6–10.4)
Chloride: 105 mmol/L (ref 98–110)
Creat: 0.81 mg/dL (ref 0.50–0.99)
GFR, Est African American: 86 mL/min/{1.73_m2} (ref >=60)
GFR, Est Non African American: 75 mL/min/{1.73_m2} (ref >=60)
Globulin: 2.2 g/dL (calc) (ref 1.9–3.7)
Glucose, Bld: 105 mg/dL — ABNORMAL HIGH (ref 65–99)
Potassium: 4.1 mmol/L (ref 3.5–5.3)
Sodium: 142 mmol/L (ref 135–146)
Total Bilirubin: 0.3 mg/dL (ref 0.2–1.2)
Total Protein: 6.5 g/dL (ref 6.1–8.1)

## 2019-05-21 NOTE — Progress Notes (Signed)
   Subjective:    Patient ID: Lindsay Schultz, female    DOB: 1950/09/13, 69 y.o.   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   She continues on 3 drug therapy and no new issues.  Unfortunately her sputum is still growing MAI from October.  She though overall feels better with no significant sob, no cough.  Tolerating the medication with no rash.  No vision changes, no rash.     Review of Systems  Constitutional: Negative for chills and fever.  Gastrointestinal: Negative for abdominal pain and diarrhea.  Skin: Negative for rash.       Objective:   Physical Exam Constitutional:      Appearance: Normal appearance.  Eyes:     General: No scleral icterus. Cardiovascular:     Rate and Rhythm: Normal rate.  Pulmonary:     Effort: Pulmonary effort is normal. No respiratory distress.     Breath sounds: Normal breath sounds.  Neurological:     Mental Status: She is alert.  Psychiatric:        Mood and Affect: Mood normal.   SH: no tobacco       Assessment & Plan:

## 2019-05-22 NOTE — Assessment & Plan Note (Addendum)
She seems to be doing well.  Unfortunately, her sputum has remained positive.  At this point, the plan will be to complete 18 months in about June and stop.  Will check a sputum again at that time.  If she becomes more  symptomatic again, will consider IV amikacin with the three drugs.  She will need a follow up CT scan at some point as well.

## 2019-05-22 NOTE — Assessment & Plan Note (Signed)
She continues with supportive care per Dr. Vaughan Browner for this.  No recent acute exacerbations.

## 2019-06-19 ENCOUNTER — Telehealth: Payer: Self-pay | Admitting: *Deleted

## 2019-06-19 NOTE — Telephone Encounter (Signed)
Patient left message in triage stating she received her first covid vaccination 2/20.  She has been scheduled for the 2nd dose, but has questions based on the news stating that some patients may only need 1 dose if they had covid disease (which she states she did in December).  She would like Dr Comer's input on this. Please advise. Landis Gandy, RN

## 2019-06-20 NOTE — Telephone Encounter (Signed)
I recommend the second vaccine, we do not yet know if that works for everyone and won't hurt.

## 2019-06-20 NOTE — Telephone Encounter (Signed)
Called patient, left voicemail with Dr Henreitta Leber advice. Thanks!

## 2019-07-25 ENCOUNTER — Ambulatory Visit (INDEPENDENT_AMBULATORY_CARE_PROVIDER_SITE_OTHER)
Admission: RE | Admit: 2019-07-25 | Discharge: 2019-07-25 | Disposition: A | Discharge status: Home / Self Care | Payer: Medicare Other | Source: Ambulatory Visit | Attending: Pulmonary Disease | Admitting: Pulmonary Disease

## 2019-07-25 ENCOUNTER — Other Ambulatory Visit: Payer: Self-pay

## 2019-07-25 DIAGNOSIS — R911 Solitary pulmonary nodule: Secondary | ICD-10-CM

## 2019-08-14 ENCOUNTER — Other Ambulatory Visit: Payer: Self-pay

## 2019-08-14 ENCOUNTER — Encounter: Payer: Self-pay | Admitting: Pulmonary Disease

## 2019-08-14 ENCOUNTER — Ambulatory Visit (INDEPENDENT_AMBULATORY_CARE_PROVIDER_SITE_OTHER): Payer: Medicare Other | Admitting: Pulmonary Disease

## 2019-08-14 ENCOUNTER — Telehealth: Payer: Self-pay | Admitting: Pulmonary Disease

## 2019-08-14 VITALS — BP 142/80 | HR 66 | Ht 63.0 in | Wt 101.8 lb

## 2019-08-14 DIAGNOSIS — A310 Pulmonary mycobacterial infection: Secondary | ICD-10-CM | POA: Diagnosis not present

## 2019-08-14 DIAGNOSIS — J479 Bronchiectasis, uncomplicated: Secondary | ICD-10-CM

## 2019-08-14 MED ORDER — SODIUM CHLORIDE 3 % IN NEBU
INHALATION_SOLUTION | Freq: Two times a day (BID) | RESPIRATORY_TRACT | 5 refills | Status: DC
Start: 2019-08-14 — End: 2019-08-15

## 2019-08-14 MED ORDER — ALBUTEROL SULFATE HFA 108 (90 BASE) MCG/ACT IN AERS: 2.0000 | INHALATION_SPRAY | Freq: Four times a day (QID) | RESPIRATORY_TRACT | 11 refills | Status: DC | PRN

## 2019-08-14 NOTE — Telephone Encounter (Signed)
Called spoke with patient.  Let her know refill has been sent Nothing further needed at this time.

## 2019-08-14 NOTE — Patient Instructions (Signed)
Continue the albuterol inhaler We will start you on hypertonic saline nebs twice a day Continue using the percussion vest twice a day after administration of the nebulizer Continue antibiotic treatment with infectious disease  We will get a CT chest without contrast in 6 months time. Follow-up in clinic after CT chest.

## 2019-08-14 NOTE — Progress Notes (Signed)
Lindsay Schultz    [id number]    18-Apr-1951  Primary Care Physician:Plotnikov, Evie Lacks, MD  Referring Physician: Cassandria Anger, MD [address] [place],  [place] [address]  Chief complaint:   Follow up for Bronchiectasis with MAI infection Sinusitis  Seasonal allergies GERD  HPI: Lindsay Schultz is a 69 year old female with history of asthmatic bronchitis, sinusitis, and seasonal allergies.  She's had a workup for her bronchiectasis including CBC, Ig levels, sputum cultures, A1AT levels, RF which were normal. PFTs do not show any obstruction and very mild restriction and reduction in diffusion capacity. Her husband has recently diagnosed with colon cancer s/p resection and chemo in 2017. Lindsay Schultz was stressed during all this but he is doing better now with cancer in remission.  She has attacks of bronchiectasis exacerbations about once a year. She states that the infection usually begins in the sinus and then moves into the lung. This usually resolves after course of antibiotics. She has H/O seasonal allergies (worst in fall and spring) and asthmatic bronchitis. Never smoked.  Underwent bronchoscopy on 12/22/2017 with cultures growing MAI She has been seen by ID and started on antibiotic therapy  Interim history: This is a tele-visit follow-up Lindsay Schultz is currently on treatment for MAI with Dr. De Burrs She is tolerating it well with no issues next States that breathing is doing well.  Has chronic cough with sputum production. Using the chest vest for clearance of secretion.  Interim history: Continues on MAI treatment, managed by Dr. Linus Salmons States that breathing is stable.  Is using the albuterol very sparingly  Outpatient Encounter Medications as of 08/14/2019  Medication Sig  . acetaminophen (TYLENOL) 500 MG tablet Take 500-1,000 mg by mouth every 6 (six) hours as needed (for pain.).  Marland Kitchen albuterol (PROVENTIL HFA;VENTOLIN HFA) 108 (90 Base) MCG/ACT inhaler Inhale 2 puffs into  the lungs every 6 (six) hours as needed for wheezing or shortness of breath.  Marland Kitchen azithromycin (ZITHROMAX) 500 MG tablet Take 1 tablet (500 mg total) by mouth 3 (three) times a week.  . calcitonin, salmon, (MIACALCIN/FORTICAL) 200 UNIT/ACT nasal spray Place 1 spray into alternate nostrils daily.  . Carboxymethylcellulose Sodium (THERATEARS) 0.25 % SOLN Place 1 drop into both eyes 3 (three) times daily as needed (for dry/irritated eyes.).  Marland Kitchen cholecalciferol (VITAMIN D) 1000 units tablet Take 1,000 Units by mouth 2 (two) times daily.  Marland Kitchen CRANBERRY PO Take 1 capsule by mouth every evening.  . Cyanocobalamin (VITAMIN B-12) 500 MCG SUBL 1 tab sl qd (Patient taking differently: Take 500 mcg by mouth 2 (two) times daily. )  . ethambutol (MYAMBUTOL) 400 MG tablet Take 3 tablets (1,200 mg total) by mouth 3 (three) times a week.  . famotidine (PEPCID) 20 MG tablet Take 20 mg by mouth daily.  . fluticasone (FLONASE) 50 MCG/ACT nasal spray Place 1 spray into both nostrils daily as needed.  . Lactobacillus (ACIDOPHILUS PO) Take 1 capsule by mouth every evening.  . nadolol (CORGARD) 40 MG tablet Take 0.5 tablets (20 mg total) by mouth 2 (two) times daily.  . ondansetron (ZOFRAN ODT) 4 MG disintegrating tablet Take 1 tablet (4 mg total) by mouth every 8 (eight) hours as needed for nausea or vomiting.  . pantoprazole (PROTONIX) 40 MG tablet Take 1 tablet (40 mg total) by mouth daily.  Marland Kitchen pyridOXINE (VITAMIN B-6) 100 MG tablet Take 1 tablet (100 mg total) by mouth daily.  . rifampin (RIFADIN) 300 MG capsule  Take 2 capsules (600 mg total) by mouth 3 (three) times a week.  . [DISCONTINUED] Multiple Vitamins-Minerals (MULTIVITAMIN WITH MINERALS) tablet Take 1 tablet by mouth every evening.   . [DISCONTINUED] triamcinolone cream (KENALOG) 0.1 % Apply 1 application topically 4 (four) times daily.   No facility-administered encounter medications on file as of 08/14/2019.   Physical Exam: No physical examination  done Televisit  Data Reviewed: Imaging CT scan 11/15/14-Multiple nodules in the left upper lobe, bilateral bronchiectasis. CT scan 01/24/16 Stable pulmonary nodules, bilateral bronchiectasis. Left upper lobe ground glass opacities. Images reviewed. CT scan 11/16/2017- stable pulmonary nodules, bilateral bronchiectasis, new area of opacity in the right upper lobe consistent with mucus impaction.  CT chest 07/25/2019-stable bronchiectasis with mild increase in tree-in-bud appearance in the right upper lobe.  Stable pulmonary nodules. I have reviewed the images personally.  Labs 12/09/14 IgG-773 IgG 8-2 37 IgM-64 IgE-56 Rheumatoid factor < 10 All within normal limits.  AFB and fungal cultures 11/19/14- no acid-fast bacilli, Candida albicans. 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 Repeat sputum AFB 01/05/2018-negative  Bronchoscopy 02/21/2018 Cultures- Mycobacterium avium complex BAL cell count-WBC 635, 9% lymphs, 1% eos, 76% neutrophils  PFTs  02/10/15 FVC 2.35 (74%), FEV1 1.79 (74%), F/F 77, TLC 79%, DLCO 78% No obstruction. Mild reduction in lung volumes and diffusion capacity  Assessment:  Bronchiectasis, MAC infection Started on treatment with antibiotics per ID. Continue chest percussion with vest for mucociliary clearance. Add hypertonic saline nebs.  Abnormal CT scan with lung nodules Likely benign as most of them are remained stable.  Reviewed with mild increase in tree-in-bud. Continue monitoring this closely Follow-up CT in 6 months  Asthmatic  Bronchitis She is asymptomatic with symptoms mostly during spring and fall allergy. She'll continue the albuterol when necessary.  GERD Continue Zantac.  Health maintenance 12/13/2017-influenza 02/07/2016-Prevnar 05/28/2014-Pneumovax  Plan/Recommendations: - Treatment for MAI per infectious disease - Continue albuterol as needed - Percussion vest, add hypertonic saline - Follow-up CT  Marshell Garfinkel MD [facility] 08/14/2019, 12:03 PM  CC: Plotnikov, Evie Lacks, MD

## 2019-08-15 ENCOUNTER — Telehealth: Payer: Self-pay | Admitting: Pulmonary Disease

## 2019-08-15 MED ORDER — SODIUM CHLORIDE 3 % IN NEBU: INHALATION_SOLUTION | Freq: Two times a day (BID) | RESPIRATORY_TRACT | 5 refills | Status: DC

## 2019-08-15 NOTE — Telephone Encounter (Signed)
Spoke with patient. She stated that she needed to have the hypertonic solution sent to [facility]. She went to her regular pharmacy today and they advised her that they do not have access to order it. [facility] advised her that if they had the RX, they could see if they could order it for her.   RX has been sent.   Nothing further needed at time of call.

## 2019-10-01 ENCOUNTER — Other Ambulatory Visit: Payer: Self-pay

## 2019-10-01 ENCOUNTER — Other Ambulatory Visit: Payer: Self-pay | Admitting: Internal Medicine

## 2019-10-01 ENCOUNTER — Encounter: Payer: Self-pay | Admitting: Internal Medicine

## 2019-10-01 ENCOUNTER — Ambulatory Visit (INDEPENDENT_AMBULATORY_CARE_PROVIDER_SITE_OTHER): Payer: Medicare Other | Admitting: Internal Medicine

## 2019-10-01 VITALS — BP 161/79 | HR 54 | Temp 97.4°F | Wt 103.0 lb

## 2019-10-01 DIAGNOSIS — J479 Bronchiectasis, uncomplicated: Secondary | ICD-10-CM | POA: Diagnosis not present

## 2019-10-01 DIAGNOSIS — Z5181 Encounter for therapeutic drug level monitoring: Secondary | ICD-10-CM

## 2019-10-01 DIAGNOSIS — A310 Pulmonary mycobacterial infection: Secondary | ICD-10-CM | POA: Diagnosis not present

## 2019-10-01 LAB — HEPATIC FUNCTION PANEL
AG Ratio: 2.1 (calc) (ref 1.0–2.5)
ALT: 13 U/L (ref 6–29)
AST: 18 U/L (ref 10–35)
Albumin: 4.2 g/dL (ref 3.6–5.1)
Alkaline phosphatase (APISO): 94 U/L (ref 37–153)
Bilirubin, Direct: 0.1 mg/dL (ref 0.0–0.2)
Globulin: 2 g/dL (calc) (ref 1.9–3.7)
Indirect Bilirubin: 0.2 mg/dL (calc) (ref 0.2–1.2)
Total Bilirubin: 0.3 mg/dL (ref 0.2–1.2)
Total Protein: 6.2 g/dL (ref 6.1–8.1)

## 2019-10-01 MED ORDER — BUTALBITAL-APAP-CAFFEINE 50-325-40 MG PO TABS: 1.0000 | ORAL_TABLET | Freq: Four times a day (QID) | ORAL | 1 refills | Status: DC | PRN

## 2019-10-01 NOTE — Assessment & Plan Note (Signed)
She is continuing with her inhalers and now on saline. Continue follow up with Dr. Vaughan Browner

## 2019-10-01 NOTE — Progress Notes (Signed)
   Subjective:    Patient ID: Lindsay Schultz, female    DOB: 27-Apr-1950, 69 y.o.   MRN: [id number]  HPI Here for follow up of MAI bronchiectasis.   She continues on 3 drug therapy and no new issues.  She has been on this about 18 months.  Tolerated but has lost some weight, about 10-15 lbs overall.  No recent exacerbation.  Recent CT by Dr. Vaughan Browner essentially unchanged.  No fever or chills.      Review of Systems  Constitutional: Negative for chills and fever.  Gastrointestinal: Negative for abdominal pain and diarrhea.  Skin: Negative for rash.       Objective:   Physical Exam Constitutional:      Appearance: Normal appearance.  Eyes:     General: No scleral icterus. Cardiovascular:     Rate and Rhythm: Normal rate.  Pulmonary:     Effort: Pulmonary effort is normal. No respiratory distress.     Breath sounds: Normal breath sounds.  Neurological:     Mental Status: She is alert.  Psychiatric:        Mood and Affect: Mood normal.   SH: no tobacco       Assessment & Plan:

## 2019-10-01 NOTE — Progress Notes (Signed)
Out of Fioricet

## 2019-10-01 NOTE — Assessment & Plan Note (Signed)
Will check her LFTs

## 2019-10-01 NOTE — Assessment & Plan Note (Signed)
She has completed 18 months.  Though the sputum has remained positive, at this point, likely no benefit to continuing treatment and the patient would like a holiday from treatment.  The best [place] forward I feel is to stop now and see how she does.  She has another CT in 6 months with follow up with Dr. Vaughan Browner.  If things worsen either clinically or radiographically + clinically, will consider restarting 3 drug therapy + three times weekly amikacin.   Otherwise she will follow up PRN

## 2019-10-10 ENCOUNTER — Other Ambulatory Visit: Payer: Self-pay

## 2019-10-10 ENCOUNTER — Other Ambulatory Visit: Payer: Medicare Other

## 2019-10-10 DIAGNOSIS — A310 Pulmonary mycobacterial infection: Secondary | ICD-10-CM

## 2019-10-23 ENCOUNTER — Other Ambulatory Visit: Payer: Self-pay | Admitting: Internal Medicine

## 2019-10-23 DIAGNOSIS — Z1231 Encounter for screening mammogram for malignant neoplasm of breast: Secondary | ICD-10-CM

## 2019-10-30 ENCOUNTER — Ambulatory Visit (INDEPENDENT_AMBULATORY_CARE_PROVIDER_SITE_OTHER): Payer: Medicare Other | Admitting: Internal Medicine

## 2019-10-30 ENCOUNTER — Other Ambulatory Visit: Payer: Self-pay

## 2019-10-30 ENCOUNTER — Encounter: Payer: Self-pay | Admitting: Internal Medicine

## 2019-10-30 VITALS — BP 182/96 | HR 52 | Temp 98.1°F | Ht 63.0 in | Wt 103.0 lb

## 2019-10-30 DIAGNOSIS — I10 Essential (primary) hypertension: Secondary | ICD-10-CM | POA: Diagnosis not present

## 2019-10-30 DIAGNOSIS — A310 Pulmonary mycobacterial infection: Secondary | ICD-10-CM | POA: Diagnosis not present

## 2019-10-30 DIAGNOSIS — R634 Abnormal weight loss: Secondary | ICD-10-CM | POA: Diagnosis not present

## 2019-10-30 DIAGNOSIS — E538 Deficiency of other specified B group vitamins: Secondary | ICD-10-CM

## 2019-10-30 DIAGNOSIS — Z0000 Encounter for general adult medical examination without abnormal findings: Secondary | ICD-10-CM

## 2019-10-30 MED ORDER — FLUTICASONE PROPIONATE 50 MCG/ACT NA SUSP: 1.0000 | Freq: Every day | NASAL | 11 refills | Status: DC | PRN

## 2019-10-30 MED ORDER — CALCITONIN (SALMON) 200 UNIT/ACT NA SOLN: 1.0000 | Freq: Every day | NASAL | 11 refills | Status: DC

## 2019-10-30 MED ORDER — PANTOPRAZOLE SODIUM 40 MG PO TBEC: 40.0000 mg | DELAYED_RELEASE_TABLET | Freq: Every day | ORAL | 11 refills | Status: DC

## 2019-10-30 MED ORDER — NADOLOL 40 MG PO TABS: 20.0000 mg | ORAL_TABLET | Freq: Two times a day (BID) | ORAL | 11 refills | Status: DC

## 2019-10-30 NOTE — Assessment & Plan Note (Addendum)
Pt finished 18 mo of abx F/u HAs, GERD

## 2019-10-30 NOTE — Assessment & Plan Note (Signed)
Resolved

## 2019-10-30 NOTE — Assessment & Plan Note (Signed)
On B12 

## 2019-10-30 NOTE — Progress Notes (Signed)
Subjective:  Patient ID: Lindsay Schultz, female    DOB: 08/02/1950  Age: 69 y.o. MRN: [id number]  CC: No chief complaint on file.   HPI Lindsay Schultz presents for MIC infection - finished 18 mo of abx F/u HAs, GERD  Outpatient Medications Prior to Visit  Medication Sig Dispense Refill   acetaminophen (TYLENOL) 500 MG tablet Take 500-1,000 mg by mouth every 6 (six) hours as needed (for pain.).     albuterol (VENTOLIN HFA) 108 (90 Base) MCG/ACT inhaler Inhale 2 puffs into the lungs every 6 (six) hours as needed for wheezing or shortness of breath. 18 g 11   butalbital-acetaminophen-caffeine (FIORICET) 50-325-40 MG tablet Take 1 tablet by mouth every 6 (six) hours as needed for headache. 60 tablet 1   calcitonin, salmon, (MIACALCIN/FORTICAL) 200 UNIT/ACT nasal spray Place 1 spray into alternate nostrils daily. 3.7 mL 11   Carboxymethylcellulose Sodium (THERATEARS) 0.25 % SOLN Place 1 drop into both eyes 3 (three) times daily as needed (for dry/irritated eyes.).     cholecalciferol (VITAMIN D) 1000 units tablet Take 1,000 Units by mouth 2 (two) times daily.     CRANBERRY PO Take 1 capsule by mouth every evening.     Cyanocobalamin (VITAMIN B-12) 500 MCG SUBL 1 tab sl qd (Patient taking differently: Take 500 mcg by mouth 2 (two) times daily. ) 100 tablet 11   famotidine (PEPCID) 20 MG tablet Take 20 mg by mouth daily.     fluticasone (FLONASE) 50 MCG/ACT nasal spray Place 1 spray into both nostrils daily as needed. 16 g 11   Lactobacillus (ACIDOPHILUS PO) Take 1 capsule by mouth every evening.     nadolol (CORGARD) 40 MG tablet Take 0.5 tablets (20 mg total) by mouth 2 (two) times daily. 30 tablet 11   pantoprazole (PROTONIX) 40 MG tablet Take 1 tablet (40 mg total) by mouth daily. 30 tablet 11   sodium chloride HYPERTONIC 3 % nebulizer solution Take by nebulization in the morning and at bedtime. 240 mL 5   No facility-administered medications prior to visit.    ROS: Review of  Systems  Constitutional: Negative for activity change, appetite change, chills, fatigue and unexpected weight change.  HENT: Negative for congestion, mouth sores and sinus pressure.   Eyes: Negative for visual disturbance.  Respiratory: Negative for cough and chest tightness.   Gastrointestinal: Positive for nausea. Negative for abdominal pain.  Genitourinary: Negative for difficulty urinating, frequency and vaginal pain.  Musculoskeletal: Positive for arthralgias. Negative for back pain and gait problem.  Skin: Negative for pallor and rash.  Neurological: Negative for dizziness, tremors, weakness, numbness and headaches.  Psychiatric/Behavioral: Negative for confusion and sleep disturbance.    Objective:  BP (!) 182/96 (BP Location: Right Arm, Patient Position: Sitting, Cuff Size: Normal)    Pulse (!) 52    Temp 98.1 F (36.7 C) (Oral)    Ht 5\' 3"  (1.6 m)    Wt 103 lb (46.7 kg)    SpO2 98%    BMI 18.25 kg/m   BP Readings from Last 3 Encounters:  10/30/19 (!) 182/96  10/01/19 (!) 161/79  08/14/19 (!) 142/80    Wt Readings from Last 3 Encounters:  10/30/19 103 lb (46.7 kg)  10/01/19 103 lb (46.7 kg)  08/14/19 101 lb 12.8 oz (46.2 kg)    Physical Exam Constitutional:      General: She is not in acute distress.    Appearance: She is well-developed.  HENT:  Head: Normocephalic.     Right Ear: External ear normal.     Left Ear: External ear normal.     Nose: Nose normal.  Eyes:     General:        Right eye: No discharge.        Left eye: No discharge.     Conjunctiva/sclera: Conjunctivae normal.     Pupils: Pupils are equal, round, and reactive to light.  Neck:     Thyroid: No thyromegaly.     Vascular: No JVD.     Trachea: No tracheal deviation.  Cardiovascular:     Rate and Rhythm: Normal rate and regular rhythm.     Heart sounds: Normal heart sounds.  Pulmonary:     Effort: No respiratory distress.     Breath sounds: No stridor. No wheezing.  Abdominal:      General: Bowel sounds are normal. There is no distension.     Palpations: Abdomen is soft. There is no mass.     Tenderness: There is no abdominal tenderness. There is no guarding or rebound.  Musculoskeletal:        General: No tenderness.     Cervical back: Normal range of motion and neck supple.  Lymphadenopathy:     Cervical: No cervical adenopathy.  Skin:    Findings: No erythema or rash.  Neurological:     Mental Status: She is oriented to person, place, and time.     Cranial Nerves: No cranial nerve deficit.     Motor: No abnormal muscle tone.     Coordination: Coordination normal.     Deep Tendon Reflexes: Reflexes normal.  Psychiatric:        Behavior: Behavior normal.        Thought Content: Thought content normal.        Judgment: Judgment normal.     Lab Results  Component Value Date   WBC 4.8 02/05/2019   HGB 12.9 02/05/2019   HCT 38.1 02/05/2019   PLT 166 02/05/2019   GLUCOSE 105 (H) 05/21/2019   CHOL 264 (H) 11/20/2018   TRIG 193.0 (H) 11/20/2018   HDL 56.20 11/20/2018   LDLDIRECT 140.0 12/13/2017   LDLCALC 169 (H) 11/20/2018   ALT 13 10/01/2019   AST 18 10/01/2019   NA 142 05/21/2019   K 4.1 05/21/2019   CL 105 05/21/2019   CREATININE 0.81 05/21/2019   BUN 7 05/21/2019   CO2 32 05/21/2019   TSH 1.15 11/20/2018   INR 1.05 01/26/2016    CT CHEST WO CONTRAST  Result Date: 07/25/2019 CLINICAL DATA:  MAI on long-term antibiotic therapy. Follow-up pulmonary nodules. EXAM: CT CHEST WITHOUT CONTRAST TECHNIQUE: Multidetector CT imaging of the chest was performed following the standard protocol without IV contrast. COMPARISON:  11/17/2018 chest CT. FINDINGS: Cardiovascular: Normal heart size. No significant pericardial effusion/thickening. Left anterior descending coronary atherosclerosis. Atherosclerotic nonaneurysmal thoracic aorta. Normal caliber pulmonary arteries. Mediastinum/Nodes: Apparent left hemithyroidectomy. No discrete right thyroid nodules.  Unremarkable esophagus. No pathologically enlarged axillary, mediastinal or hilar lymph nodes, noting limited sensitivity for the detection of hilar adenopathy on this noncontrast study. Lungs/Pleura: No pneumothorax. No pleural effusion. Widespread moderate cylindrical and varicoid bronchiectasis throughout both lungs, most prominent in the right middle lobe and bilateral upper lobes. Extensive patchy mucoid impaction and mild patchy tree-in-bud opacity throughout both lungs at the areas of bronchiectasis. These findings are generally stable with mildly worsened tree-in-bud opacity in the anterior right upper lobe (series 3/image 59). Scattered parenchymal bands  at the areas of bronchiectasis, most prominent in the anterior right mid lungs, unchanged and compatible with postinfectious scarring. Previously described 5 mm left upper lobe solid pulmonary nodule is stable (series 3/image 64). Previously described 7 mm right lower lobe solid pulmonary nodule is slightly decreased to 6 mm (series 3/image 76). No acute consolidative airspace disease, lung masses or new significant pulmonary nodules. Upper abdomen: No acute abnormality. Musculoskeletal: No aggressive appearing focal osseous lesions. Mild thoracic spondylosis. IMPRESSION: 1. 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 2. Previously described pulmonary nodules are stable to slightly decreased in size, compatible with benign nodules. 3. One vessel coronary atherosclerosis. 4. Aortic Atherosclerosis (ICD10-I70.0). Electronically Signed   By: Ilona Sorrel M.D.   On: 07/25/2019 17:21    Assessment & Plan:   There are no diagnoses linked to this encounter.   No orders of the defined types were  placed in this encounter.    Follow-up: No follow-ups on file.  Walker Kehr, MD

## 2019-10-30 NOTE — Assessment & Plan Note (Signed)
Nadolol

## 2019-11-07 ENCOUNTER — Other Ambulatory Visit: Payer: Self-pay | Admitting: Internal Medicine

## 2019-11-07 MED ORDER — FLUTICASONE PROPIONATE 50 MCG/ACT NA SUSP: 2.0000 | Freq: Every day | NASAL | 5 refills | Status: DC | PRN

## 2019-11-07 NOTE — Telephone Encounter (Signed)
    [facility] calling, requesting new prescription be sent for fluticasone (FLONASE) 50 MCG/ACT nasal spray Patient is using 2 sprays daily

## 2019-11-09 ENCOUNTER — Telehealth: Payer: Self-pay

## 2019-11-09 NOTE — Telephone Encounter (Signed)
Patient called office today to follow up on lab results from last month. States she is feeling fine, but would like to know if she has cleared her lung infection. Will forward message to MD to review results. Will call patient with updates.  MYCOBACTERIA, CULTURE, WITH FLUOROCHROME SMEAR     Specimen Information: Sputum      0 Result Notes Component 1 mo ago  MICRO NUMBER: [id number] P   SPECIMEN QUALITY: Adequate P   Source: SPUTUM P   STATUS: PRELIMINARY P   SMEAR: No acid fast bacilli seen. P   RESULT: Culture results to follow. Final reports of negative cultures can be expected in approximately six weeks. Positive cultures are reported immediately.         [place]

## 2019-11-13 ENCOUNTER — Ambulatory Visit
Admission: RE | Admit: 2019-11-13 | Discharge: 2019-11-13 | Disposition: A | Discharge status: Home / Self Care | Payer: Medicare Other | Source: Ambulatory Visit | Attending: Internal Medicine | Admitting: Internal Medicine

## 2019-11-13 ENCOUNTER — Other Ambulatory Visit: Payer: Self-pay

## 2019-11-13 DIAGNOSIS — Z1231 Encounter for screening mammogram for malignant neoplasm of breast: Secondary | ICD-10-CM

## 2019-11-13 NOTE — Telephone Encounter (Signed)
Called patient to update with lab results. Unable to reach patient at this time; will leave voicemail requesting call back. Will not leave lab information during message. [facility]

## 2019-11-13 NOTE — Telephone Encounter (Signed)
So far it remains negative.  Will be another week or so before it is final.  thanks

## 2019-11-13 NOTE — Telephone Encounter (Signed)
I spoke to patient and shared information relayed from Dr Linus Salmons related to her culture.  Results so fare are negative and will complete in another week.  Laverle Patter, RN

## 2019-11-26 LAB — MYCOBACTERIA,CULT W/FLUOROCHROME SMEAR
MICRO NUMBER:: 10628096
SMEAR:: NONE SEEN
SPECIMEN QUALITY:: ADEQUATE

## 2019-11-26 NOTE — Telephone Encounter (Signed)
Relayed final results to patient. Lindsay Schultz

## 2020-01-29 ENCOUNTER — Other Ambulatory Visit: Payer: Self-pay

## 2020-01-29 ENCOUNTER — Ambulatory Visit (INDEPENDENT_AMBULATORY_CARE_PROVIDER_SITE_OTHER)
Admission: RE | Admit: 2020-01-29 | Discharge: 2020-01-29 | Disposition: A | Discharge status: Home / Self Care | Payer: Medicare Other | Source: Ambulatory Visit | Attending: Pulmonary Disease | Admitting: Pulmonary Disease

## 2020-01-29 DIAGNOSIS — J479 Bronchiectasis, uncomplicated: Secondary | ICD-10-CM | POA: Diagnosis not present

## 2020-02-01 ENCOUNTER — Other Ambulatory Visit: Payer: Self-pay | Admitting: Pulmonary Disease

## 2020-02-01 DIAGNOSIS — J479 Bronchiectasis, uncomplicated: Secondary | ICD-10-CM

## 2020-02-01 DIAGNOSIS — R911 Solitary pulmonary nodule: Secondary | ICD-10-CM

## 2020-02-12 ENCOUNTER — Telehealth: Payer: Self-pay | Admitting: Pulmonary Disease

## 2020-02-12 MED ORDER — PREDNISONE 20 MG PO TABS: 40.0000 mg | ORAL_TABLET | Freq: Every day | ORAL | 0 refills | Status: AC

## 2020-02-12 MED ORDER — DOXYCYCLINE HYCLATE 100 MG PO TABS
100.0000 mg | ORAL_TABLET | Freq: Two times a day (BID) | ORAL | 0 refills | Status: DC
Start: 2020-02-12 — End: 2020-04-01

## 2020-02-12 NOTE — Telephone Encounter (Signed)
Please call in doxycycline 100 mg twice daily Prednisone 40 mg a day for 5 days Continue chest vest, saline nebs and Mucinex as prescribed  If the symptoms continue then we may need to check back with Dr. Linus Salmons, ID about restarting MAI therapy.

## 2020-02-12 NOTE — Telephone Encounter (Signed)
Called and spoke with patient to let her know that we were going to send in 2 RX. She expressed understanding and verified pharmacy. Also asked what cough syrup we recommend for patients. Advise her to try over the counter Delsym. Advised patient that after she completed prescriptions in their entirety to let us know if she is not feeling better to call and let us know. Nothing further needed at this time.

## 2020-02-12 NOTE — Telephone Encounter (Signed)
Spoke with the pt  She c/o increased SOB, fatigue and cough x 4 days  Her cough is prod with clear sputum, occ will produce some green  She states not having any f/c/s, body aches  Has been covid vaccinated  She states she has increased her VEST by 5% and now up to 70%  Using saline nebs BID and mucinex  Next scheduled appt is with Dr Vaughan Browner for 04/01/2020 Please advise any recs, thanks!

## 2020-02-21 ENCOUNTER — Other Ambulatory Visit: Payer: Self-pay | Admitting: Pulmonary Disease

## 2020-03-26 ENCOUNTER — Encounter: Payer: Self-pay | Admitting: Internal Medicine

## 2020-03-27 ENCOUNTER — Ambulatory Visit: Payer: Medicare Other | Admitting: Pulmonary Disease

## 2020-04-01 ENCOUNTER — Other Ambulatory Visit: Payer: Self-pay

## 2020-04-01 ENCOUNTER — Ambulatory Visit (INDEPENDENT_AMBULATORY_CARE_PROVIDER_SITE_OTHER): Payer: Medicare Other | Admitting: Pulmonary Disease

## 2020-04-01 ENCOUNTER — Telehealth: Payer: Self-pay

## 2020-04-01 ENCOUNTER — Encounter: Payer: Self-pay | Admitting: Pulmonary Disease

## 2020-04-01 VITALS — BP 126/68 | HR 59 | Temp 97.3°F | Ht 63.0 in | Wt 106.6 lb

## 2020-04-01 DIAGNOSIS — J479 Bronchiectasis, uncomplicated: Secondary | ICD-10-CM | POA: Diagnosis not present

## 2020-04-01 DIAGNOSIS — A310 Pulmonary mycobacterial infection: Secondary | ICD-10-CM

## 2020-04-01 DIAGNOSIS — R059 Cough, unspecified: Secondary | ICD-10-CM

## 2020-04-01 NOTE — Telephone Encounter (Signed)
At [facility] today, Dr. Vaughan Browner advised that he would discuss pt's case with Dr. Linus Salmons at Infectious Disease office.  After pt left, Dr. Vaughan Browner and Dr. Linus Salmons have decided that they want pt to have a repeat CT chest in 3 months and to be referred back to Dr. Henreitta Leber office.  I have placed referral to ID/Dr. Comer's office, and ordered CT chest in 3 months.  LMTCB X1 for pt to make aware.  Routing to [place] for follow-up as I do not typically work with Dr. Vaughan Browner.

## 2020-04-01 NOTE — Patient Instructions (Addendum)
Glad you are doing well with regard to your breathing However on review of CT there appears to be some progression of inflammation in the lung likely related to the chronic MAI infection. Follow-up CT chest without contrast in 3 months.  Referred back to ID Dr. Linus Salmons I will discuss with Dr. Linus Salmons about the most appropriate [place] to follow-up and if we need to resume therapy  Follow-up in 3 months.

## 2020-04-01 NOTE — Progress Notes (Signed)
Lindsay Schultz    [id number]    05-May-1950  Primary Care Physician:Plotnikov, Evie Lacks, MD  Referring Physician: Cassandria Anger, MD [address] [place],  [place] [address]  Chief complaint:   Follow up for Bronchiectasis with MAI infection Sinusitis  Seasonal allergies GERD  HPI: Lindsay Schultz is a 69 year old female with history of asthmatic bronchitis, sinusitis, and seasonal allergies.  She's had a workup for her bronchiectasis including CBC, Ig levels, sputum cultures, A1AT levels, RF which were normal. PFTs do not show any obstruction and very mild restriction and reduction in diffusion capacity. Her husband has recently diagnosed with colon cancer s/p resection and chemo in 2017. Lindsay Schultz was stressed during all this but he is doing better now with cancer in remission.  She has attacks of bronchiectasis exacerbations about once a year. She states that the infection usually begins in the sinus and then moves into the lung. This usually resolves after course of antibiotics. She has H/O seasonal allergies (worst in fall and spring) and asthmatic bronchitis. Never smoked.  Underwent bronchoscopy on 12/22/2017 with cultures growing MAI She has been seen by ID and started on antibiotic therapy. Managed by Dr. Linus Salmons  Interim history: Completed 18 months of therapy.  Taken off antimicrobial therapy on June 2021 with intention to follow-up with CT.  Per ID note if things worsen either clinically or radiographically + clinically, will consider restarting 3 drug therapy + three times weekly amikacin.    She had bronchiectasis exacerbation in October treated with doxycycline and prednisone. Overall feels that she is back to baseline.  Outpatient Encounter Medications as of 04/01/2020  Medication Sig  . acetaminophen (TYLENOL) 500 MG tablet Take 500-1,000 mg by mouth every 6 (six) hours as needed (for pain.).  Marland Kitchen albuterol (VENTOLIN HFA) 108 (90 Base) MCG/ACT inhaler Inhale 2  puffs into the lungs every 6 (six) hours as needed for wheezing or shortness of breath.  . calcitonin, salmon, (MIACALCIN/FORTICAL) 200 UNIT/ACT nasal spray Place 1 spray into alternate nostrils daily.  . Carboxymethylcellulose Sodium 0.25 % SOLN Place 1 drop into both eyes 3 (three) times daily as needed (for dry/irritated eyes.).  Marland Kitchen cholecalciferol (VITAMIN D) 1000 units tablet Take 1,000 Units by mouth 2 (two) times daily.  Marland Kitchen CRANBERRY PO Take 1 capsule by mouth every evening.  . Cyanocobalamin (VITAMIN B-12) 500 MCG SUBL 1 tab sl qd (Patient taking differently: Take 500 mcg by mouth 2 (two) times daily.)  . famotidine (PEPCID) 20 MG tablet Take 20 mg by mouth daily.  . fluticasone (FLONASE) 50 MCG/ACT nasal spray Place 2 sprays into both nostrils daily as needed.  . Lactobacillus (ACIDOPHILUS PO) Take 1 capsule by mouth every evening.  . nadolol (CORGARD) 40 MG tablet Take 0.5 tablets (20 mg total) by mouth 2 (two) times daily.  . pantoprazole (PROTONIX) 40 MG tablet Take 1 tablet (40 mg total) by mouth daily.  . sodium chloride HYPERTONIC 3 % nebulizer solution Take by nebulization in the morning and at bedtime.  . [DISCONTINUED] butalbital-acetaminophen-caffeine (FIORICET) 50-325-40 MG tablet Take 1 tablet by mouth every 6 (six) hours as needed for headache. (Patient not taking: Reported on 04/01/2020)  . [DISCONTINUED] doxycycline (VIBRA-TABS) 100 MG tablet Take 1 tablet (100 mg total) by mouth 2 (two) times daily. (Patient not taking: Reported on 04/01/2020)   No facility-administered encounter medications on file as of 04/01/2020.   Physical Exam: Blood pressure 126/68, pulse (!) 59, temperature (!)  97.3 F (36.3 C), temperature source Temporal, height _0  (1.6 m), weight 106 lb 9.6 oz (48.4 kg), SpO2 100 %. Gen:      No acute distress HEENT:  EOMI, sclera anicteric Neck:     No masses; no thyromegaly Lungs:    Clear to auscultation bilaterally; normal respiratory effort CV:          Regular rate and rhythm; no murmurs Abd:      + bowel sounds; soft, non-tender; no palpable masses, no distension Ext:    No edema; adequate peripheral perfusion Skin:      Warm and dry; no rash Neuro: alert and oriented x 3 Psych: normal mood and affect  Data Reviewed: Imaging CT scan 11/15/14-Multiple nodules in the left upper lobe, bilateral bronchiectasis. CT scan 01/24/16 Stable pulmonary nodules, bilateral bronchiectasis. Left upper lobe ground glass opacities. Images reviewed. CT scan 11/16/2017- stable pulmonary nodules, bilateral bronchiectasis, new area of opacity in the right upper lobe consistent with mucus impaction.  CT chest 07/25/2019-stable bronchiectasis with mild increase in tree-in-bud appearance in the right upper lobe.  Stable pulmonary nodules. CT chest 01/29/2020-slight interval worsening of tree-in-bud appearance with nodular consolidation in the apical right upper lobe. I reviewed the images personally.  Labs 12/09/14 IgG-773 IgG 8-2 37 IgM-64 IgE-56 Rheumatoid factor < 10 All within normal limits.  AFB and fungal cultures 11/19/14- no acid-fast bacilli, Candida albicans. 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 Repeat sputum AFB 01/05/2018-negative  Bronchoscopy 02/21/2018 Cultures- Mycobacterium avium complex BAL cell count-WBC 635, 9% lymphs, 1% eos, 76% neutrophils  PFTs  02/10/15 FVC 2.35 (74%), FEV1 1.79 (74%), F/F 77, TLC 79%, DLCO 78% No obstruction. Mild reduction in lung volumes and diffusion capacity  Assessment:  Bronchiectasis, MAC infection Off treatment since June 2021 after completing 18 months of therapy CT scan shows increase in tree-in-bud and nodular opacities concerning for progression of MAI I will discuss with Dr. Linus Salmons about reevaluation and possibly restarting therapy If needed we can repeat a bronchoscope to obtain adequate samples in case we have to retest for resistance.  Continue chest percussion with  vest, hypertonic saline for mucociliary clearance.  Abnormal CT scan with lung nodules May be related to underlying MAI Follow-up scan in 3 months.  Asthmatic  Bronchitis She is asymptomatic with symptoms mostly during spring and fall allergy. She'll continue the albuterol when necessary.  GERD Continue Zantac.  Health maintenance 02/07/2016-Prevnar 05/28/2014-Pneumovax  Up-to-date with flu and Covid.  Encouraged her to get the booster  Plan/Recommendations: - Discussed with ID about restarting MAI treatment - Continue albuterol as needed - Percussion vest, add hypertonic saline - Follow-up CT  Marshell Garfinkel MD [facility] 04/01/2020, 2:06 PM  CC: Plotnikov, Evie Lacks, MD

## 2020-04-02 NOTE — Telephone Encounter (Signed)
Called spoke with patient let her know Dr. Matilde Bash recommendations. She states she is having trouble getting the hypertonic solution, I told her that [facility] in [place] is the only pharmacy I know around that has it, she is going to call and see if her RX from [facility] can be transferred, if not she will call our office back .   Nothing further needed at this time.,

## 2020-04-02 NOTE — Telephone Encounter (Signed)
Patient is returning phone call. Patient phone number is [phone].

## 2020-04-03 ENCOUNTER — Encounter: Payer: Self-pay | Admitting: Internal Medicine

## 2020-04-08 LAB — IFOBT (OCCULT BLOOD): IFOBT: NEGATIVE

## 2020-05-01 ENCOUNTER — Ambulatory Visit: Payer: Medicare Other | Admitting: Internal Medicine

## 2020-05-07 ENCOUNTER — Other Ambulatory Visit: Payer: Self-pay

## 2020-05-07 ENCOUNTER — Encounter: Payer: Self-pay | Admitting: Internal Medicine

## 2020-05-07 ENCOUNTER — Ambulatory Visit (INDEPENDENT_AMBULATORY_CARE_PROVIDER_SITE_OTHER): Payer: Medicare Other | Admitting: Internal Medicine

## 2020-05-07 VITALS — BP 142/80 | HR 55 | Temp 98.5°F | Wt 106.2 lb

## 2020-05-07 VITALS — BP 156/69 | HR 57 | Temp 98.1°F | Resp 16 | Ht 63.0 in | Wt 104.6 lb

## 2020-05-07 DIAGNOSIS — R042 Hemoptysis: Secondary | ICD-10-CM

## 2020-05-07 DIAGNOSIS — Z23 Encounter for immunization: Secondary | ICD-10-CM

## 2020-05-07 DIAGNOSIS — Z5181 Encounter for therapeutic drug level monitoring: Secondary | ICD-10-CM

## 2020-05-07 DIAGNOSIS — I10 Essential (primary) hypertension: Secondary | ICD-10-CM | POA: Diagnosis not present

## 2020-05-07 DIAGNOSIS — A310 Pulmonary mycobacterial infection: Secondary | ICD-10-CM

## 2020-05-07 DIAGNOSIS — J479 Bronchiectasis, uncomplicated: Secondary | ICD-10-CM

## 2020-05-07 DIAGNOSIS — J471 Bronchiectasis with (acute) exacerbation: Secondary | ICD-10-CM | POA: Diagnosis not present

## 2020-05-07 DIAGNOSIS — E538 Deficiency of other specified B group vitamins: Secondary | ICD-10-CM | POA: Diagnosis not present

## 2020-05-07 DIAGNOSIS — Z0000 Encounter for general adult medical examination without abnormal findings: Secondary | ICD-10-CM

## 2020-05-07 NOTE — Addendum Note (Signed)
Addended by: Thayer Headings on: 05/07/2020 03:16 PM   Modules accepted: Orders

## 2020-05-07 NOTE — Assessment & Plan Note (Signed)
Off abx after 1.5 years of treatment CT is worse Seeing Dr Linus Salmons today

## 2020-05-07 NOTE — Assessment & Plan Note (Signed)
Clinically I feel she is stable with her cough, sob and has not had more frequent exacerbations.  Her CT is reviewed and is a bit worse with one new area. I discussed the options for her as being treatment now vs watchful waiting.  Certainly if she develops more lung dysfunction, more frequent exacerbations or other concerns, we could reinitiate treatment, though at this point, would be much less likely to offer any benefit. Also, this would necessitate using IV amikacin for additional therapy at this point.  Since she is stable, I discussed watchful waiting and she is in agreement with this.   She will rtc in 3 months.

## 2020-05-07 NOTE — Assessment & Plan Note (Signed)
Labs done today including LFTs and creat in case treatment needed in the future.  Also other labs per request of Dr. Alain Marion.

## 2020-05-07 NOTE — Assessment & Plan Note (Signed)
On B12 

## 2020-05-07 NOTE — Progress Notes (Signed)
   Subjective:    Patient ID: Lindsay Schultz, female    DOB: May 07, 1950, 70 y.o.   MRN: [id number]  HPI Here for follow up of bronchiectasis and pulmonary Mycobacterial infection.   Lindsay Schultz completed 18 months of treatment with 3 drug therapy (rif/azithromycin/ethambutol) and the last sputum checked in June 2021 was negative for Mycobacteria.  Compared to prior to treatment, Lindsay Schultz feels better overall with weight gain and infrequent exacerbations.  Lindsay Schultz was seen by Dr. Vaughan Browner recently after a repeat CT scan and there was some progression on the scan noted compared to previous CT.  Lindsay Schultz though is having no fever, no hemoptysis, or other new issues.     Review of Systems  Constitutional: Positive for fatigue. Negative for chills, fever and unexpected weight change.  Respiratory: Negative for shortness of breath.        Occasional cough, occasional brown sputum       Objective:   Physical Exam Constitutional:      Appearance: Normal appearance.  Eyes:     General: No scleral icterus. Cardiovascular:     Rate and Rhythm: Normal rate and regular rhythm.  Pulmonary:     Effort: Pulmonary effort is normal. No respiratory distress.     Breath sounds: Normal breath sounds.  Neurological:     Mental Status: Lindsay Schultz is alert.  Psychiatric:        Mood and Affect: Mood normal.   SH: no tobacco        Assessment & Plan:

## 2020-05-07 NOTE — Assessment & Plan Note (Signed)
Off abx after 1.5 years of treatment CT is worse Dr Linus Salmons - OV today

## 2020-05-07 NOTE — Progress Notes (Signed)
Subjective:  Patient ID: Ruby Logiudice Freilich, female    DOB: July 23, 1950  Age: 70 y.o. MRN: [id number]  CC: Follow-up (6 month f/u)   HPI Katia Hannen Duggar presents for HTN, B12 def, bronchiectases f/u Off abx after 1.5 years of treatment  Outpatient Medications Prior to Visit  Medication Sig Dispense Refill  . acetaminophen (TYLENOL) 500 MG tablet Take 500-1,000 mg by mouth every 6 (six) hours as needed (for pain.).    Marland Kitchen albuterol (VENTOLIN HFA) 108 (90 Base) MCG/ACT inhaler Inhale 2 puffs into the lungs every 6 (six) hours as needed for wheezing or shortness of breath. 18 g 11  . calcitonin, salmon, (MIACALCIN/FORTICAL) 200 UNIT/ACT nasal spray Place 1 spray into alternate nostrils daily. 3.7 mL 11  . Carboxymethylcellulose Sodium 0.25 % SOLN Place 1 drop into both eyes 3 (three) times daily as needed (for dry/irritated eyes.).    Marland Kitchen cholecalciferol (VITAMIN D) 1000 units tablet Take 1,000 Units by mouth 2 (two) times daily.    Marland Kitchen CRANBERRY PO Take 1 capsule by mouth every evening.    . Cyanocobalamin (VITAMIN B-12) 500 MCG SUBL 1 tab sl qd (Patient taking differently: Take 500 mcg by mouth 2 (two) times daily.) 100 tablet 11  . famotidine (PEPCID) 20 MG tablet Take 20 mg by mouth daily.    . fluticasone (FLONASE) 50 MCG/ACT nasal spray Place 2 sprays into both nostrils daily as needed. 16 g 5  . Ginseng (GIN-ZING) 100 MG CAPS Take by mouth daily.    . Lactobacillus (ACIDOPHILUS PO) Take 1 capsule by mouth every evening.    . nadolol (CORGARD) 40 MG tablet Take 0.5 tablets (20 mg total) by mouth 2 (two) times daily. 30 tablet 11  . pantoprazole (PROTONIX) 40 MG tablet Take 1 tablet (40 mg total) by mouth daily. 30 tablet 11  . sodium chloride HYPERTONIC 3 % nebulizer solution Take by nebulization in the morning and at bedtime. 240 mL 5  . TART CHERRY PO Take by mouth daily.     No facility-administered medications prior to visit.    ROS: Review of Systems  Constitutional: Positive for  fatigue. Negative for activity change, appetite change, chills and unexpected weight change.  HENT: Negative for congestion, mouth sores and sinus pressure.   Eyes: Negative for visual disturbance.  Respiratory: Positive for cough. Negative for chest tightness.   Gastrointestinal: Negative for abdominal pain and nausea.  Genitourinary: Negative for difficulty urinating, frequency and vaginal pain.  Musculoskeletal: Negative for back pain and gait problem.  Skin: Negative for pallor and rash.  Neurological: Negative for dizziness, tremors, weakness, numbness and headaches.  Psychiatric/Behavioral: Negative for confusion and sleep disturbance.    Objective:  BP (!) 142/80 (BP Location: Left Arm)   Pulse (!) 55   Temp 98.5 F (36.9 C) (Oral)   Wt 106 lb 3.2 oz (48.2 kg)   SpO2 99%   BMI 18.81 kg/m   BP Readings from Last 3 Encounters:  05/07/20 (!) 142/80  04/01/20 126/68  10/30/19 (!) 182/96    Wt Readings from Last 3 Encounters:  05/07/20 106 lb 3.2 oz (48.2 kg)  04/01/20 106 lb 9.6 oz (48.4 kg)  10/30/19 103 lb (46.7 kg)    Physical Exam Constitutional:      General: She is not in acute distress.    Appearance: She is well-developed.  HENT:     Head: Normocephalic.     Right Ear: External ear normal.     Left Ear: External  ear normal.     Nose: Nose normal.     Mouth/Throat:     Mouth: Oropharynx is clear and moist.  Eyes:     General:        Right eye: No discharge.        Left eye: No discharge.     Conjunctiva/sclera: Conjunctivae normal.     Pupils: Pupils are equal, round, and reactive to light.  Neck:     Thyroid: No thyromegaly.     Vascular: No JVD.     Trachea: No tracheal deviation.  Cardiovascular:     Rate and Rhythm: Normal rate and regular rhythm.     Heart sounds: Normal heart sounds.  Pulmonary:     Effort: No respiratory distress.     Breath sounds: No stridor. No wheezing.  Abdominal:     General: Bowel sounds are normal. There is no  distension.     Palpations: Abdomen is soft. There is no mass.     Tenderness: There is no abdominal tenderness. There is no guarding or rebound.  Musculoskeletal:        General: No tenderness or edema.     Cervical back: Normal range of motion and neck supple.  Lymphadenopathy:     Cervical: No cervical adenopathy.  Skin:    Findings: No erythema or rash.  Neurological:     Mental Status: She is oriented to person, place, and time.     Cranial Nerves: No cranial nerve deficit.     Motor: No abnormal muscle tone.     Coordination: Coordination normal.     Deep Tendon Reflexes: Reflexes normal.  Psychiatric:        Mood and Affect: Mood and affect normal.        Behavior: Behavior normal.        Thought Content: Thought content normal.        Judgment: Judgment normal.    thin   Lab Results  Component Value Date   WBC 4.8 02/05/2019   HGB 12.9 02/05/2019   HCT 38.1 02/05/2019   PLT 166 02/05/2019   GLUCOSE 105 (H) 05/21/2019   CHOL 264 (H) 11/20/2018   TRIG 193.0 (H) 11/20/2018   HDL 56.20 11/20/2018   LDLDIRECT 140.0 12/13/2017   LDLCALC 169 (H) 11/20/2018   ALT 13 10/01/2019   AST 18 10/01/2019   NA 142 05/21/2019   K 4.1 05/21/2019   CL 105 05/21/2019   CREATININE 0.81 05/21/2019   BUN 7 05/21/2019   CO2 32 05/21/2019   TSH 1.15 11/20/2018   INR 1.05 01/26/2016    CT Chest Wo Contrast  Result Date: 01/29/2020 CLINICAL DATA:  Bronchiectasis. EXAM: CT CHEST WITHOUT CONTRAST TECHNIQUE: Multidetector CT imaging of the chest was performed following the standard protocol without IV contrast. COMPARISON:  07/25/2019, 11/17/2018 and 11/16/2017. FINDINGS: Cardiovascular: Atherosclerotic calcification of the aorta and coronary arteries. Heart is enlarged. No pericardial effusion. Mediastinum/Nodes: Left thyroidectomy. Mediastinal and axillary lymph nodes are not enlarged by CT size criteria. Hilar regions are difficult to evaluate without IV contrast. There are scattered  calcified hilar and subcarinal lymph nodes. Esophagus is grossly unremarkable. Lungs/Pleura: Biapical pleuroparenchymal scarring. Calcified granulomas. Extensive cylindrical and sometimes varicose bronchiectasis with peribronchial thickening and mucoid impaction. Associated peribronchovascular nodularity. Findings are slightly progressive from 07/25/2019, including an area of new nodular consolidation in the posterior apical right upper lobe, measuring 10 mm (3/32). Subpleural radiation fibrosis in the left upper lobe. No pleural fluid. Airway is  unremarkable. Upper Abdomen: Visualized portions of the liver and gallbladder are unremarkable. Slight nodular thickening of the adrenal glands bilaterally. Visualized portions the kidneys, spleen, pancreas, stomach and bowel are grossly unremarkable. Musculoskeletal: Degenerative changes in the spine. Probable bone island in the lumbar spine. No worrisome lytic or sclerotic lesions. IMPRESSION: 1. 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 A new area of nodular consolidation in the apical right upper lobe is strongly felt to be related. 2. Aortic atherosclerosis (ICD10-I70.0). Coronary artery calcification. Electronically Signed   By: Lorin Picket M.D.   On: 01/29/2020 10:46    Assessment & Plan:    Walker Kehr, MD

## 2020-05-07 NOTE — Addendum Note (Signed)
Addended by: Earnstine Regal on: 05/07/2020 12:15 PM   Modules accepted: Orders

## 2020-05-07 NOTE — Assessment & Plan Note (Signed)
Pneumovax given

## 2020-05-07 NOTE — Assessment & Plan Note (Signed)
She has not had any recent hemoptysis.

## 2020-05-07 NOTE — Assessment & Plan Note (Signed)
Nadolol 

## 2020-05-07 NOTE — Assessment & Plan Note (Signed)
I discussed with her the possibility of pulmonary rehab and will discuss with Dr. Vaughan Browner if this would be a good option for her, if offered in [place].

## 2020-05-08 LAB — HEPATIC FUNCTION PANEL
AG Ratio: 2 (calc) (ref 1.0–2.5)
ALT: 12 U/L (ref 6–29)
AST: 21 U/L (ref 10–35)
Albumin: 4.4 g/dL (ref 3.6–5.1)
Alkaline phosphatase (APISO): 90 U/L (ref 37–153)
Bilirubin, Direct: 0.1 mg/dL (ref 0.0–0.2)
Globulin: 2.2 g/dL (calc) (ref 1.9–3.7)
Indirect Bilirubin: 0.3 mg/dL (calc) (ref 0.2–1.2)
Total Bilirubin: 0.4 mg/dL (ref 0.2–1.2)
Total Protein: 6.6 g/dL (ref 6.1–8.1)

## 2020-05-08 LAB — BASIC METABOLIC PANEL
BUN: 12 mg/dL (ref 7–25)
CO2: 32 mmol/L (ref 20–32)
Calcium: 9.7 mg/dL (ref 8.6–10.4)
Chloride: 104 mmol/L (ref 98–110)
Creat: 0.66 mg/dL (ref 0.50–0.99)
Glucose, Bld: 82 mg/dL (ref 65–99)
Potassium: 3.8 mmol/L (ref 3.5–5.3)
Sodium: 142 mmol/L (ref 135–146)

## 2020-05-08 LAB — CBC WITH DIFFERENTIAL/PLATELET
Absolute Monocytes: 543 cells/uL (ref 200–950)
Basophils Absolute: 28 cells/uL (ref 0–200)
Basophils Relative: 0.5 %
Eosinophils Absolute: 129 cells/uL (ref 15–500)
Eosinophils Relative: 2.3 %
HCT: 37.7 % (ref 35.0–45.0)
Hemoglobin: 12.8 g/dL (ref 11.7–15.5)
Lymphs Abs: 1053 cells/uL (ref 850–3900)
MCH: 30.6 pg (ref 27.0–33.0)
MCHC: 34 g/dL (ref 32.0–36.0)
MCV: 90.2 fL (ref 80.0–100.0)
MPV: 10.5 fL (ref 7.5–12.5)
Monocytes Relative: 9.7 %
Neutro Abs: 3847 cells/uL (ref 1500–7800)
Neutrophils Relative %: 68.7 %
Platelets: 174 10*3/uL (ref 140–400)
RBC: 4.18 10*6/uL (ref 3.80–5.10)
RDW: 12.2 % (ref 11.0–15.0)
Total Lymphocyte: 18.8 %
WBC: 5.6 10*3/uL (ref 3.8–10.8)

## 2020-05-08 LAB — URINALYSIS
Bilirubin Urine: NEGATIVE
Glucose, UA: NEGATIVE
Hgb urine dipstick: NEGATIVE
Ketones, ur: NEGATIVE
Leukocytes,Ua: NEGATIVE
Nitrite: NEGATIVE
Protein, ur: NEGATIVE
Specific Gravity, Urine: 1.015 (ref 1.001–1.03)
pH: 7 (ref 5.0–8.0)

## 2020-05-08 LAB — TSH: TSH: 1.61 mIU/L (ref 0.40–4.50)

## 2020-05-08 LAB — LIPID PANEL
Cholesterol: 217 mg/dL — ABNORMAL HIGH (ref <200)
HDL: 59 mg/dL (ref >=50)
LDL Cholesterol (Calc): 133 mg/dL (calc) — ABNORMAL HIGH
Non-HDL Cholesterol (Calc): 158 mg/dL (calc) — ABNORMAL HIGH (ref <130)
Total CHOL/HDL Ratio: 3.7 (calc) (ref <5.0)
Triglycerides: 131 mg/dL (ref <150)

## 2020-05-09 ENCOUNTER — Other Ambulatory Visit: Payer: Self-pay | Admitting: *Deleted

## 2020-05-09 DIAGNOSIS — J479 Bronchiectasis, uncomplicated: Secondary | ICD-10-CM

## 2020-05-09 NOTE — Progress Notes (Signed)
Pulmonary rehab requested at [facility] per Dr. Vaughan Browner.  Order placed.

## 2020-05-16 ENCOUNTER — Encounter: Payer: Self-pay | Admitting: Pulmonary Disease

## 2020-05-16 ENCOUNTER — Telehealth: Payer: Self-pay | Admitting: Pulmonary Disease

## 2020-05-16 ENCOUNTER — Telehealth: Payer: Self-pay

## 2020-05-16 NOTE — Telephone Encounter (Signed)
05/16/20  Called and spoke with patient.  Patient reports that she is currently taking 10 days of doxycycline from urgent care.  X-ray was not performed at urgent care.  Covid test is negative.  Patient reporting persistent worsening cough as well as thick purulent mucus.  Patient is requesting prednisone as well as additional recommendations for management of cough.  Patient has been utilizing over-the-counter Mucinex without much assistance.  Patient reports adherence to hypertonic saline nebs as well as therapy vest twice daily.  We do have open availabilities today in office with patient does live 35 miles away from our office.  Dr. Vaughan Browner please advise    Wyn Quaker FNP

## 2020-05-16 NOTE — Telephone Encounter (Signed)
Patient called today requesting something for a cough. Patient was seen at urgent care on 05/12/20, tested for Covid and results were negative. Patient states she still has a bad cough. Patient has been advised Dr. Linus Salmons is not in the office today. Patient states she will try to contact her PCP and pulmonologist to see if they will prescribed something for her cough. Lindsay Schultz

## 2020-05-16 NOTE — Telephone Encounter (Signed)
Patient is returning phone call. Patient phone number is [phone]. 

## 2020-05-16 NOTE — Telephone Encounter (Signed)
Right, thanks. Pulmonary should handle that.

## 2020-05-16 NOTE — Telephone Encounter (Signed)
05/16/2020  Attempted to contact patient.  Left voicemail.  Upon chart review can see the patient was recently tested for COVID-19 and seen in urgent care and started on 10-day course of doxycycline.  In care everywhere you can see the patient was negative for COVID-19.  If patient is still reporting acute worsening symptoms despite the 10-day course of doxycycline she should be currently taking she needs to be scheduled for an office visit with APP or Dr. Vaughan Browner to further review.  Can continue over-the-counter measures such as Mucinex, flutter valve use, chest percussion help with clear mucus.  If patient has worsened fevers, fatigue, sudden shortness of breath she needs to have an in person physical exam at either an urgent care, emergency room or our office.  Wyn Quaker, FNP

## 2020-05-19 MED ORDER — BENZONATATE 100 MG PO CAPS
100.0000 mg | ORAL_CAPSULE | Freq: Two times a day (BID) | ORAL | 0 refills | Status: DC | PRN
Start: 2020-05-19 — End: 2020-07-30

## 2020-05-19 MED ORDER — PREDNISONE 20 MG PO TABS: 40.0000 mg | ORAL_TABLET | Freq: Every day | ORAL | 0 refills | Status: DC

## 2020-05-19 NOTE — Telephone Encounter (Signed)
Please call in prednisone 40 mg/day for 5 days. Tessalon 100 mg bid PRN for cough

## 2020-05-19 NOTE — Telephone Encounter (Signed)
Called and spoke with the pt and notified of response per Dr Vaughan Browner. Rxs sent to pharm. Nothing further needed.

## 2020-05-24 ENCOUNTER — Other Ambulatory Visit: Payer: Self-pay | Admitting: Internal Medicine

## 2020-06-05 ENCOUNTER — Other Ambulatory Visit: Payer: Self-pay | Admitting: Internal Medicine

## 2020-06-16 ENCOUNTER — Telehealth: Payer: Self-pay | Admitting: Internal Medicine

## 2020-06-16 MED ORDER — AMLODIPINE BESYLATE 2.5 MG PO TABS
2.5000 mg | ORAL_TABLET | Freq: Every day | ORAL | 3 refills | Status: DC
Start: 2020-06-16 — End: 2021-05-26

## 2020-06-16 NOTE — Telephone Encounter (Signed)
Per Dietary Ed class -- SBP 130-180 Start Amlodipine low dose RTC in 1 mo thx

## 2020-06-17 NOTE — Telephone Encounter (Signed)
Notified pt w/MD response.../lmb 

## 2020-06-18 ENCOUNTER — Other Ambulatory Visit: Payer: Medicare Other

## 2020-06-19 ENCOUNTER — Ambulatory Visit (INDEPENDENT_AMBULATORY_CARE_PROVIDER_SITE_OTHER)
Admission: RE | Admit: 2020-06-19 | Discharge: 2020-06-19 | Disposition: A | Discharge status: Home / Self Care | Payer: Medicare Other | Source: Ambulatory Visit | Attending: Pulmonary Disease | Admitting: Pulmonary Disease

## 2020-06-19 ENCOUNTER — Other Ambulatory Visit: Payer: Self-pay

## 2020-06-19 DIAGNOSIS — A310 Pulmonary mycobacterial infection: Secondary | ICD-10-CM

## 2020-06-19 DIAGNOSIS — R059 Cough, unspecified: Secondary | ICD-10-CM

## 2020-06-19 DIAGNOSIS — J479 Bronchiectasis, uncomplicated: Secondary | ICD-10-CM | POA: Diagnosis not present

## 2020-06-25 ENCOUNTER — Telehealth: Payer: Self-pay | Admitting: Pulmonary Disease

## 2020-06-25 ENCOUNTER — Telehealth: Payer: Self-pay | Admitting: Internal Medicine

## 2020-06-25 NOTE — Telephone Encounter (Signed)
ATC Patient x's 2. Phone rung many times, with no answer. No answering machine or VM. Will try again at a later time.  CT chest results from Dr. Mannam-06/20/20-  Will discuss in person at time of clinical visit on 3/14

## 2020-06-25 NOTE — Telephone Encounter (Signed)
Called and spoke with Patient. Patient aware Dr. Vaughan Browner will review CT chest results with her at next OV. Understanding stated. Nothing further at this time.

## 2020-06-25 NOTE — Telephone Encounter (Signed)
Patient thinks she has an yeast infection and requesting a prescription to be called in to assist.   Please advise.  Preferred Pharmacy:  [facility] [place], [place] - [address] AT [address] Phone:  [phone]  Fax:  [phone]

## 2020-06-26 ENCOUNTER — Ambulatory Visit: Payer: Medicare Other

## 2020-06-26 MED ORDER — FLUCONAZOLE 150 MG PO TABS: 150.0000 mg | ORAL_TABLET | Freq: Once | ORAL | 1 refills | Status: AC

## 2020-06-26 NOTE — Telephone Encounter (Signed)
Notified pt MD sent pof.Marland KitchenJohny Schultz

## 2020-06-26 NOTE — Telephone Encounter (Signed)
Done. Thx.

## 2020-06-30 ENCOUNTER — Ambulatory Visit: Payer: Medicare Other | Admitting: Pulmonary Disease

## 2020-07-01 ENCOUNTER — Ambulatory Visit (INDEPENDENT_AMBULATORY_CARE_PROVIDER_SITE_OTHER): Payer: Medicare Other

## 2020-07-01 DIAGNOSIS — Z0000 Encounter for general adult medical examination without abnormal findings: Secondary | ICD-10-CM

## 2020-07-01 NOTE — Progress Notes (Signed)
I connected with Lindsay Schultz today by telephone and verified that I am speaking with the correct person using two identifiers. Location patient: home Location provider: work Persons participating in the virtual visit: Yvonnia Tango and [place], LPN.   I discussed the limitations, risks, security and privacy concerns of performing an evaluation and management service by telephone and the availability of in person appointments. I also discussed with the patient that there may be a patient responsible charge related to this service. The patient expressed understanding and verbally consented to this telephonic visit.    Interactive audio and video telecommunications were attempted between this provider and patient, however failed, due to patient having technical difficulties OR patient did not have access to video capability.  We continued and completed visit with audio only.  Some vital signs may be absent or patient reported.   Time Spent with patient on telephone encounter: 30 minutes  Subjective:   Lindsay Schultz is a 70 y.o. female who presents for Medicare Annual (Subsequent) preventive examination.  Review of Systems    No ROS. Medicare Wellness Visit. Additional risk factors are reflected in social history. Cardiac Risk Factors include: advanced age (>23men, >67 women);hypertension;family history of premature cardiovascular disease     Objective:    There were no vitals filed for this visit. There is no height or weight on file to calculate BMI.  Advanced Directives 07/01/2020 02/21/2018 12/13/2016 01/24/2016  Does Patient Have a Medical Advance Directive? Yes Yes Yes No  Type of Advance Directive Living will;Healthcare Power of [place];Living will -  Does patient want to make changes to medical advance directive? No - Patient declined - - -  Copy of [place] in Chart? No - copy requested No - copy  requested No - copy requested -  Would patient like information on creating a medical advance directive? - - - No - patient declined information    Current Medications (verified) Outpatient Encounter Medications as of 07/01/2020  Medication Sig  . acetaminophen (TYLENOL) 500 MG tablet Take 500-1,000 mg by mouth every 6 (six) hours as needed (for pain.).  Marland Kitchen albuterol (VENTOLIN HFA) 108 (90 Base) MCG/ACT inhaler Inhale 2 puffs into the lungs every 6 (six) hours as needed for wheezing or shortness of breath.  Marland Kitchen amLODipine (NORVASC) 2.5 MG tablet Take 1 tablet (2.5 mg total) by mouth daily.  . benzonatate (TESSALON) 100 MG capsule Take 1 capsule (100 mg total) by mouth 2 (two) times daily as needed for cough.  . butalbital-acetaminophen-caffeine (FIORICET) 50-325-40 MG tablet Take 1 tablet by mouth every 6 (six) hours as needed for headache.  . calcitonin, salmon, (MIACALCIN/FORTICAL) 200 UNIT/ACT nasal spray Place 1 spray into alternate nostrils daily.  . Carboxymethylcellulose Sodium 0.25 % SOLN Place 1 drop into both eyes 3 (three) times daily as needed (for dry/irritated eyes.).  Marland Kitchen cholecalciferol (VITAMIN D) 1000 units tablet Take 1,000 Units by mouth 2 (two) times daily.  Marland Kitchen CRANBERRY PO Take 1 capsule by mouth every evening.  . Cyanocobalamin (VITAMIN B-12) 500 MCG SUBL 1 tab sl qd (Patient taking differently: Take 500 mcg by mouth 2 (two) times daily.)  . famotidine (PEPCID) 20 MG tablet Take 20 mg by mouth daily.  . fluticasone (FLONASE) 50 MCG/ACT nasal spray Place 2 sprays into both nostrils daily as needed.  . Ginseng (GIN-ZING) 100 MG CAPS Take by mouth daily.  . Lactobacillus (ACIDOPHILUS PO) Take 1 capsule by  mouth every evening.  . nadolol (CORGARD) 40 MG tablet Take 0.5 tablets (20 mg total) by mouth 2 (two) times daily.  . pantoprazole (PROTONIX) 40 MG tablet Take 1 tablet (40 mg total) by mouth daily.  . predniSONE (DELTASONE) 20 MG tablet Take 2 tablets (40 mg total) by mouth  daily with breakfast.  . sodium chloride HYPERTONIC 3 % nebulizer solution Take by nebulization in the morning and at bedtime.  Marland Kitchen TART CHERRY PO Take by mouth daily.   No facility-administered encounter medications on file as of 07/01/2020.    Allergies (verified) Meloxicam, Paroxetine, Penicillins, Zanaflex [tizanidine hcl], Ciprofloxacin, Clarithromycin, and Zithromax [azithromycin]   History: Past Medical History:  Diagnosis Date  . Allergic rhinitis   . Benign paroxysmal positional vertigo 06/14/2015  . Breast cancer ([place]) 2008  . Bronchiectasis ([place])   . GERD (gastroesophageal reflux disease)   . HTN (hypertension)   . Hx of breast cancer   . Osteopenia   . Personal history of radiation therapy 2008   Past Surgical History:  Procedure Laterality Date  . ABDOMINAL HYSTERECTOMY    . APPENDECTOMY    . BREAST LUMPECTOMY  2008   Left  . NASAL SINUS SURGERY  1997  . VIDEO BRONCHOSCOPY Bilateral 02/21/2018   Procedure: VIDEO BRONCHOSCOPY WITHOUT FLUORO;  Surgeon: Marshell Garfinkel, MD;  Location: [facility];  Service: Cardiopulmonary;  Laterality: Bilateral;   Family History  Problem Relation Age of Onset  . Sjogren's syndrome Mother   . Arthritis Father 35       RA  . Emphysema Father   . Heart disease Father   . Hypertension Other   . Hodgkin's lymphoma Brother    Social History   Socioeconomic History  . Marital status: Married    Spouse name: Not on file  . Number of children: Not on file  . Years of education: Not on file  . Highest education level: Not on file  Occupational History  . Occupation: Retired    Comment: Orthoptist  Tobacco Use  . Smoking status: Never Smoker  . Smokeless tobacco: Never Used  Substance and Sexual Activity  . Alcohol use: No    Alcohol/week: 0.0 standard drinks  . Drug use: No  . Sexual activity: Yes  Other Topics Concern  . Not on file  Social History Narrative   Regular Exercise- yes   Social Determinants of Health    Financial Resource Strain: Low Risk   . Difficulty of Paying Living Expenses: Not hard at all  Food Insecurity: No Food Insecurity  . Worried About Charity fundraiser in the Last Year: Never true  . Ran Out of Food in the Last Year: Never true  Transportation Needs: No Transportation Needs  . Lack of Transportation (Medical): No  . Lack of Transportation (Non-Medical): No  Physical Activity: Sufficiently Active  . Days of Exercise per Week: 5 days  . Minutes of Exercise per Session: 30 min  Stress: No Stress Concern Present  . Feeling of Stress : Not at all  Social Connections: Socially Integrated  . Frequency of Communication with Friends and Family: More than three times a week  . Frequency of Social Gatherings with Friends and Family: Once a week  . Attends Religious Services: More than 4 times per year  . Active Member of Clubs or Organizations: Yes  . Attends Archivist Meetings: 1 to 4 times per year  . Marital Status: Married    Tobacco Counseling Counseling given:  Not Answered   Clinical Intake:  Pre-visit preparation completed: Yes  Pain : No/denies pain     Nutritional Risks: None Diabetes: No  How often do you need to have someone help you when you read instructions, pamphlets, or other written materials from your doctor or pharmacy?: 1 - Never What is the last grade level you completed in school?: 2 years of college  Diabetic? no  Interpreter Needed?: No  Information entered by :: Lisette Abu, LPN   Activities of Daily Living In your present state of health, do you have any difficulty performing the following activities: 07/01/2020  Hearing? N  Vision? N  Difficulty concentrating or making decisions? N  Walking or climbing stairs? N  Dressing or bathing? N  Doing errands, shopping? N  Preparing Food and eating ? N  Using the Toilet? N  In the past six months, have you accidently leaked urine? N  Do you have problems with loss of  bowel control? N  Managing your Medications? N  Managing your Finances? N  Housekeeping or managing your Housekeeping? N  Some recent data might be hidden    Patient Care Team: Plotnikov, Evie Lacks, MD as PCP - General Magrinat, Virgie Dad, MD (Hematology and Oncology) Marshell Garfinkel, MD as Consulting Physician (Pulmonary Disease) Lyndal Pulley, DO as Attending Physician (Sports Medicine)  Indicate any recent Medical Services you may have received from other than [facility] providers in the past year (date may be approximate).     Assessment:   This is a routine wellness examination for Lindsay Schultz.  Hearing/Vision screen No exam data present  Dietary issues and exercise activities discussed: Current Exercise Habits: Home exercise routine, Type of exercise: walking;treadmill;stretching;strength training/weights;Other - see comments (physical therapy 3 x a week for 1 hour), Time (Minutes): 30, Frequency (Times/Week): 5, Weekly Exercise (Minutes/Week): 150, Intensity: Moderate  Goals    . Be as healthy as possible     Continue to walk and eat healthy.      Depression Screen PHQ 2/9 Scores 05/07/2020 10/01/2019 02/05/2019 04/26/2018 03/14/2018 12/13/2016  PHQ - 2 Score 0 0 0 0 0 0  PHQ- 9 Score - - - - - 0    Fall Risk Fall Risk  07/01/2020 05/07/2020 05/07/2020 10/01/2019 02/05/2019  Falls in the past year? 0 0 0 0 0  Number falls in past yr: 0 0 0 - -  Injury with Fall? 0 0 0 - -  Risk for fall due to : No Fall Risks - - - -  Follow up - - - - Falls evaluation completed    FALL RISK PREVENTION PERTAINING TO THE HOME:  Any stairs in or around the home? Yes  If so, are there any without handrails? No  Home free of loose throw rugs in walkways, pet beds, electrical cords, etc? Yes  Adequate lighting in your home to reduce risk of falls? Yes   ASSISTIVE DEVICES UTILIZED TO PREVENT FALLS:  Life alert? No  Use of a cane, walker or w/c? No  Grab bars in the bathroom? Yes  Shower chair  or bench in shower? No  Elevated toilet seat or a handicapped toilet? No   TIMED UP AND GO:  Was the test performed? No .  Length of time to ambulate 10 feet: 0 sec.   Gait steady and fast without use of assistive device  Cognitive Function: No flowsheet data found.         Immunizations Immunization History  Administered Date(s)  Administered  . Influenza Split 02/15/2012, 02/13/2015  . Influenza Whole 02/02/2008, 01/29/2009, 12/12/2009  . Influenza, High Dose Seasonal PF 12/13/2016, 01/18/2019  . Influenza,inj,Quad PF,6+ Mos 02/23/2013, 02/25/2014, 02/02/2016, 12/13/2017  . Influenza,inj,quad, With Preservative 03/21/2020  . PFIZER(Purple Top)SARS-COV-2 Vaccination 06/12/2019, 07/02/2019  . Pneumococcal Conjugate-13 11/07/2015  . Pneumococcal Polysaccharide-23 05/28/2014, 05/07/2020  . Tdap 09/26/2012    TDAP status: Up to date  Flu Vaccine status: Up to date  Pneumococcal vaccine status: Up to date  Covid-19 vaccine status: Completed vaccines  Qualifies for Shingles Vaccine? Yes   Zostavax completed No   Shingrix Completed?: No.    Education has been provided regarding the importance of this vaccine. Patient has been advised to call insurance company to determine out of pocket expense if they have not yet received this vaccine. Advised may also receive vaccine at local pharmacy or Health Dept. Verbalized acceptance and understanding.  Screening Tests Health Maintenance  Topic Date Due  . COVID-19 Vaccine (3 - Pfizer risk 4-dose series) 07/30/2019  . Fecal DNA (Cologuard)  12/22/2019  . MAMMOGRAM  11/12/2021  . TETANUS/TDAP  09/27/2022  . INFLUENZA VACCINE  Completed  . DEXA SCAN  Completed  . Hepatitis C Screening  Completed  . PNA vac Low Risk Adult  Completed  . HPV VACCINES  Aged Out    Health Maintenance  Health Maintenance Due  Topic Date Due  . COVID-19 Vaccine (3 - Pfizer risk 4-dose series) 07/30/2019  . Fecal DNA (Cologuard)  12/22/2019     Colorectal cancer screening: Type of screening: FOBT/FIT. Completed 04/03/2020. Repeat every 3-5 years  Mammogram status: Completed 11/13/2019. Repeat every year  Bone Density status: Completed 11/13/2019. Results reflect: Bone density results: OSTEOPOROSIS. Repeat every 2-3 years.  Lung Cancer Screening: (Low Dose CT Chest recommended if Age 51-80 years, 30 pack-year currently smoking OR have quit w/in 15years.) does not qualify.   Lung Cancer Screening Referral: no  Additional Screening:  Hepatitis C Screening: does qualify; Completed yes  Vision Screening: Recommended annual ophthalmology exams for early detection of glaucoma and other disorders of the eye. Is the patient up to date with their annual eye exam?  Yes  Who is the provider or what is the name of the office in which the patient attends annual eye exams? Dr. Dorothe Pea Raynor, OD If pt is not established with a provider, would they like to be referred to a provider to establish care? No .   Dental Screening: Recommended annual dental exams for proper oral hygiene  Community Resource Referral / Chronic Care Management: CRR required this visit?  No   CCM required this visit?  No      Plan:     I have personally reviewed and noted the following in the patient's chart:   . Medical and social history . Use of alcohol, tobacco or illicit drugs  . Current medications and supplements . Functional ability and status . Nutritional status . Physical activity . Advanced directives . List of other physicians . Hospitalizations, surgeries, and ER visits in previous 12 months . Vitals . Screenings to include cognitive, depression, and falls . Referrals and appointments  In addition, I have reviewed and discussed with patient certain preventive protocols, quality metrics, and best practice recommendations. A written personalized care plan for preventive services as well as general preventive health recommendations  were provided to patient.     Sheral Flow, LPN   6/96/2952   Nurse Notes:  Patient is cogitatively intact. There  were no vitals filed for this visit. There is no height or weight on file to calculate BMI. Patient stated that she has no issues with gait or balance; does not use any assistive devices. Medications reviewed with patient; no opioid use noted.

## 2020-07-01 NOTE — Patient Instructions (Signed)
Lindsay Schultz , Thank you for taking time to come for your Medicare Wellness Visit. I appreciate your ongoing commitment to your health goals. Please review the following plan we discussed and let me know if I can assist you in the future.   Screening recommendations/referrals: Colonoscopy: 04/03/2020 (FOBT) due every 3-5 years Mammogram: 11/13/2019 Bone Density: 11/13/2015; due every 2-3 years Recommended yearly ophthalmology/optometry visit for glaucoma screening and checkup Recommended yearly dental visit for hygiene and checkup  Vaccinations: Influenza vaccine: 03/21/2020 Pneumococcal vaccine: 11/07/2015, 05/07/2020 Tdap vaccine: 09/26/2012; due every 10 years Shingles vaccine: never done; can check with local pharmacy for price   Covid-19: 06/12/2019, 07/02/2019  Advanced directives: Please bring a copy of your health care power of attorney and living will to the office at your convenience.  Conditions/risks identified: Yes; Reviewed health maintenance screenings with patient today and relevant education, vaccines, and/or referrals were provided. Please continue to do your personal lifestyle choices by: daily care of teeth and gums, regular physical activity (goal should be 5 days a week for 30 minutes), eat a healthy diet, avoid tobacco and drug use, limiting any alcohol intake, taking a low-dose aspirin (if not allergic or have been advised by your provider otherwise) and taking vitamins and minerals as recommended by your provider. Continue doing brain stimulating activities (puzzles, reading, adult coloring books, staying active) to keep memory sharp. Continue to eat heart healthy diet (full of fruits, vegetables, whole grains, lean protein, water--limit salt, fat, and sugar intake) and increase physical activity as tolerated.  Next appointment: Please schedule your next Medicare Wellness Visit with your Nurse Health Advisor in 1 year by calling [phone].   Preventive Care 69 Years and Older,  Female Preventive care refers to lifestyle choices and visits with your health care provider that can promote health and wellness. What does preventive care include?  A yearly physical exam. This is also called an annual well check.  Dental exams once or twice a year.  Routine eye exams. Ask your health care provider how often you should have your eyes checked.  Personal lifestyle choices, including:  Daily care of your teeth and gums.  Regular physical activity.  Eating a healthy diet.  Avoiding tobacco and drug use.  Limiting alcohol use.  Practicing safe sex.  Taking low-dose aspirin every day.  Taking vitamin and mineral supplements as recommended by your health care provider. What happens during an annual well check? The services and screenings done by your health care provider during your annual well check will depend on your age, overall health, lifestyle risk factors, and family history of disease. Counseling  Your health care provider may ask you questions about your:  Alcohol use.  Tobacco use.  Drug use.  Emotional well-being.  Home and relationship well-being.  Sexual activity.  Eating habits.  History of falls.  Memory and ability to understand (cognition).  Work and work Statistician.  Reproductive health. Screening  You may have the following tests or measurements:  Height, weight, and BMI.  Blood pressure.  Lipid and cholesterol levels. These may be checked every 5 years, or more frequently if you are over 27 years old.  Skin check.  Lung cancer screening. You may have this screening every year starting at age 47 if you have a 30-pack-year history of smoking and currently smoke or have quit within the past 15 years.  Fecal occult blood test (FOBT) of the stool. You may have this test every year starting at age 71.  Flexible  sigmoidoscopy or colonoscopy. You may have a sigmoidoscopy every 5 years or a colonoscopy every 10 years  starting at age 5.  Hepatitis C blood test.  Hepatitis B blood test.  Sexually transmitted disease (STD) testing.  Diabetes screening. This is done by checking your blood sugar (glucose) after you have not eaten for a while (fasting). You may have this done every 1-3 years.  Bone density scan. This is done to screen for osteoporosis. You may have this done starting at age 46.  Mammogram. This may be done every 1-2 years. Talk to your health care provider about how often you should have regular mammograms. Talk with your health care provider about your test results, treatment options, and if necessary, the need for more tests. Vaccines  Your health care provider may recommend certain vaccines, such as:  Influenza vaccine. This is recommended every year.  Tetanus, diphtheria, and acellular pertussis (Tdap, Td) vaccine. You may need a Td booster every 10 years.  Zoster vaccine. You may need this after age 79.  Pneumococcal 13-valent conjugate (PCV13) vaccine. One dose is recommended after age 54.  Pneumococcal polysaccharide (PPSV23) vaccine. One dose is recommended after age 19. Talk to your health care provider about which screenings and vaccines you need and how often you need them. This information is not intended to replace advice given to you by your health care provider. Make sure you discuss any questions you have with your health care provider. Document Released: 05/02/2015 Document Revised: 12/24/2015 Document Reviewed: 02/04/2015 Elsevier Interactive Patient Education  2017 [place] Prevention in the Home Falls can cause injuries. They can happen to people of all ages. There are many things you can do to make your home safe and to help prevent falls. What can I do on the outside of my home?  Regularly fix the edges of walkways and driveways and fix any cracks.  Remove anything that might make you trip as you walk through a door, such as a raised step or  threshold.  Trim any bushes or trees on the path to your home.  Use bright outdoor lighting.  Clear any walking paths of anything that might make someone trip, such as rocks or tools.  Regularly check to see if handrails are loose or broken. Make sure that both sides of any steps have handrails.  Any raised decks and porches should have guardrails on the edges.  Have any leaves, snow, or ice cleared regularly.  Use sand or salt on walking paths during winter.  Clean up any spills in your garage right away. This includes oil or grease spills. What can I do in the bathroom?  Use night lights.  Install grab bars by the toilet and in the tub and shower. Do not use towel bars as grab bars.  Use non-skid mats or decals in the tub or shower.  If you need to sit down in the shower, use a plastic, non-slip stool.  Keep the floor dry. Clean up any water that spills on the floor as soon as it happens.  Remove soap buildup in the tub or shower regularly.  Attach bath mats securely with double-sided non-slip rug tape.  Do not have throw rugs and other things on the floor that can make you trip. What can I do in the bedroom?  Use night lights.  Make sure that you have a light by your bed that is easy to reach.  Do not use any sheets or blankets that  are too big for your bed. They should not hang down onto the floor.  Have a firm chair that has side arms. You can use this for support while you get dressed.  Do not have throw rugs and other things on the floor that can make you trip. What can I do in the kitchen?  Clean up any spills right away.  Avoid walking on wet floors.  Keep items that you use a lot in easy-to-reach places.  If you need to reach something above you, use a strong step stool that has a grab bar.  Keep electrical cords out of the [place].  Do not use floor polish or wax that makes floors slippery. If you must use wax, use non-skid floor wax.  Do not have  throw rugs and other things on the floor that can make you trip. What can I do with my stairs?  Do not leave any items on the stairs.  Make sure that there are handrails on both sides of the stairs and use them. Fix handrails that are broken or loose. Make sure that handrails are as long as the stairways.  Check any carpeting to make sure that it is firmly attached to the stairs. Fix any carpet that is loose or worn.  Avoid having throw rugs at the top or bottom of the stairs. If you do have throw rugs, attach them to the floor with carpet tape.  Make sure that you have a light switch at the top of the stairs and the bottom of the stairs. If you do not have them, ask someone to add them for you. What else can I do to help prevent falls?  Wear shoes that:  Do not have high heels.  Have rubber bottoms.  Are comfortable and fit you well.  Are closed at the toe. Do not wear sandals.  If you use a stepladder:  Make sure that it is fully opened. Do not climb a closed stepladder.  Make sure that both sides of the stepladder are locked into place.  Ask someone to hold it for you, if possible.  Clearly mark and make sure that you can see:  Any grab bars or handrails.  First and last steps.  Where the edge of each step is.  Use tools that help you move around (mobility aids) if they are needed. These include:  Canes.  Walkers.  Scooters.  Crutches.  Turn on the lights when you go into a dark area. Replace any light bulbs as soon as they burn out.  Set up your furniture so you have a clear path. Avoid moving your furniture around.  If any of your floors are uneven, fix them.  If there are any pets around you, be aware of where they are.  Review your medicines with your doctor. Some medicines can make you feel dizzy. This can increase your chance of falling. Ask your doctor what other things that you can do to help prevent falls. This information is not intended to  replace advice given to you by your health care provider. Make sure you discuss any questions you have with your health care provider. Document Released: 01/30/2009 Document Revised: 09/11/2015 Document Reviewed: 05/10/2014 Elsevier Interactive Patient Education  2017 [employer].

## 2020-07-22 ENCOUNTER — Encounter: Payer: Self-pay | Admitting: Pulmonary Disease

## 2020-07-22 ENCOUNTER — Other Ambulatory Visit: Payer: Self-pay

## 2020-07-22 ENCOUNTER — Ambulatory Visit (INDEPENDENT_AMBULATORY_CARE_PROVIDER_SITE_OTHER): Payer: Medicare Other | Admitting: Pulmonary Disease

## 2020-07-22 VITALS — BP 150/80 | HR 68 | Temp 98.2°F | Ht 63.0 in | Wt 107.2 lb

## 2020-07-22 DIAGNOSIS — R059 Cough, unspecified: Secondary | ICD-10-CM | POA: Diagnosis not present

## 2020-07-22 DIAGNOSIS — A310 Pulmonary mycobacterial infection: Secondary | ICD-10-CM | POA: Diagnosis not present

## 2020-07-22 DIAGNOSIS — J479 Bronchiectasis, uncomplicated: Secondary | ICD-10-CM

## 2020-07-22 NOTE — Patient Instructions (Signed)
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 Linus Salmons about options for therapy  Follow-up in 3 months.

## 2020-07-22 NOTE — Progress Notes (Signed)
Lindsay Schultz    [id number]    09/12/1950  Primary Care Physician:Plotnikov, Evie Lacks, MD  Referring Physician: Cassandria Anger, MD [address] [place],   [id number]  Chief complaint:   Follow up for Bronchiectasis with MAI infection Sinusitis  Seasonal allergies GERD  HPI: Lindsay Schultz is a 70 year old female with history of asthmatic bronchitis, sinusitis, and seasonal allergies.  She's had a workup for her bronchiectasis including CBC, Ig levels, sputum cultures, A1AT levels, RF which were normal. PFTs do not show any obstruction and very mild restriction and reduction in diffusion capacity.   Underwent bronchoscopy on 12/22/2017 with cultures growing MAI She has been seen by ID and started on antibiotic therapy. Managed by Dr. Linus Salmons Completed 18 months of therapy.  Taken off antimicrobial therapy on June 2021 with intention to follow-up with CT. Per ID note if things worsen either clinically or radiographically + clinically, will consider restarting 3 drug therapy + three times weekly amikacin.    She has attacks of bronchiectasis exacerbations about once a year. She states that the infection usually begins in the sinus and then moves into the lung. This usually resolves after course of antibiotics. She has H/O seasonal allergies (worst in fall and spring) and asthmatic bronchitis. Never smoked.   Interim history: Here for review of CT Had a mild bronchiectasis flare in January requiring prednisone for 5 days Exam states that breathing is stable.  She has started pulmonary rehab at [facility]  Using saline nebs and percussion vest every day.  Outpatient Encounter Medications as of 07/22/2020  Medication Sig  . acetaminophen (TYLENOL) 500 MG tablet Take 500-1,000 mg by mouth every 6 (six) hours as needed (for pain.).  Marland Kitchen albuterol (VENTOLIN HFA) 108 (90 Base) MCG/ACT inhaler Inhale 2 puffs into the lungs every 6 (six) hours as needed for wheezing or  shortness of breath.  Marland Kitchen amLODipine (NORVASC) 2.5 MG tablet Take 1 tablet (2.5 mg total) by mouth daily.  . benzonatate (TESSALON) 100 MG capsule Take 1 capsule (100 mg total) by mouth 2 (two) times daily as needed for cough.  . butalbital-acetaminophen-caffeine (FIORICET) 50-325-40 MG tablet Take 1 tablet by mouth every 6 (six) hours as needed for headache.  . calcitonin, salmon, (MIACALCIN/FORTICAL) 200 UNIT/ACT nasal spray Place 1 spray into alternate nostrils daily.  . Carboxymethylcellulose Sodium 0.25 % SOLN Place 1 drop into both eyes 3 (three) times daily as needed (for dry/irritated eyes.).  Marland Kitchen cholecalciferol (VITAMIN D) 1000 units tablet Take 1,000 Units by mouth 2 (two) times daily.  Marland Kitchen CRANBERRY PO Take 1 capsule by mouth every evening.  . Cyanocobalamin (VITAMIN B-12) 500 MCG SUBL 1 tab sl qd (Patient taking differently: Take 500 mcg by mouth 2 (two) times daily.)  . famotidine (PEPCID) 20 MG tablet Take 20 mg by mouth daily.  . fluticasone (FLONASE) 50 MCG/ACT nasal spray Place 2 sprays into both nostrils daily as needed.  . Ginseng (GIN-ZING) 100 MG CAPS Take by mouth daily.  . Lactobacillus (ACIDOPHILUS PO) Take 1 capsule by mouth every evening.  . nadolol (CORGARD) 40 MG tablet Take 0.5 tablets (20 mg total) by mouth 2 (two) times daily.  . pantoprazole (PROTONIX) 40 MG tablet Take 1 tablet (40 mg total) by mouth daily.  . sodium chloride HYPERTONIC 3 % nebulizer solution Take by nebulization in the morning and at bedtime.  Marland Kitchen TART CHERRY PO Take by mouth daily.  . [DISCONTINUED] predniSONE (DELTASONE) 20  MG tablet Take 2 tablets (40 mg total) by mouth daily with breakfast. (Patient not taking: Reported on 07/22/2020)   No facility-administered encounter medications on file as of 07/22/2020.   Physical Exam: Blood pressure (!) 150/80, pulse 68, temperature 98.2 F (36.8 C), temperature source Temporal, height _0  (1.6 m), weight 107 lb 3.2 oz (48.6 kg), SpO2 98 %. Gen:      No  acute distress HEENT:  EOMI, sclera anicteric Neck:     No masses; no thyromegaly Lungs:    Clear to auscultation bilaterally; normal respiratory effort CV:         Regular rate and rhythm; no murmurs Abd:      + bowel sounds; soft, non-tender; no palpable masses, no distension Ext:    No edema; adequate peripheral perfusion Skin:      Warm and dry; no rash Neuro: alert and oriented x 3 Psych: normal mood and affect  Data Reviewed: Imaging CT scan 11/15/14-Multiple nodules in the left upper lobe, bilateral bronchiectasis. CT scan 01/24/16 Stable pulmonary nodules, bilateral bronchiectasis. Left upper lobe ground glass opacities. Images reviewed. CT scan 11/16/2017- stable pulmonary nodules, bilateral bronchiectasis, new area of opacity in the right upper lobe consistent with mucus impaction.  CT chest 07/25/2019-stable bronchiectasis with mild increase in tree-in-bud appearance in the right upper lobe.  Stable pulmonary nodules. CT chest 01/29/2020-slight interval worsening of tree-in-bud appearance with nodular consolidation in the apical right upper lobe. I reviewed the images personally.  Labs 12/09/14 IgG-773 IgG 8-2 37 IgM-64 IgE-56 Rheumatoid factor < 10 All within normal limits.  AFB and fungal cultures 11/19/14- no acid-fast bacilli, Candida albicans. 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 Repeat sputum AFB 01/05/2018-negative  Bronchoscopy 02/21/2018 Cultures- Mycobacterium avium complex BAL cell count-WBC 635, 9% lymphs, 1% eos, 76% neutrophils  PFTs  02/10/15 FVC 2.35 (74%), FEV1 1.79 (74%), F/F 77, TLC 79%, DLCO 78% No obstruction. Mild reduction in lung volumes and diffusion capacity  Assessment:  Bronchiectasis, MAC infection Off treatment since June 2021 after completing 18 months of therapy CT scan shows increase in tree-in-bud and nodular opacities concerning for progression of MAI.  The changes are mild and clinically she seems to be about  the same.  Not sure we need to restart therapy as she may need long-term IV amikacin Will discuss with Dr. Linus Salmons about need for restarting therapy and if inhaled amikacin as an option  Continue chest percussion with vest, hypertonic saline for mucociliary clearance.  Asthmatic  Bronchitis She is asymptomatic with symptoms mostly during spring and fall allergy. She'll continue the albuterol when necessary.  GERD Continue Zantac.  Health maintenance 02/07/2016-Prevnar 05/28/2014-Pneumovax  Up-to-date with flu and Covid.   Plan/Recommendations: - Discussed with ID about restarting MAI treatment - Continue albuterol as needed - Percussion vest, add hypertonic saline  Marshell Garfinkel MD [facility] 07/22/2020, 11:59 AM  CC: Plotnikov, Evie Lacks, MD

## 2020-07-30 ENCOUNTER — Other Ambulatory Visit: Payer: Self-pay

## 2020-07-30 ENCOUNTER — Encounter: Payer: Self-pay | Admitting: Internal Medicine

## 2020-07-30 ENCOUNTER — Ambulatory Visit (INDEPENDENT_AMBULATORY_CARE_PROVIDER_SITE_OTHER): Payer: Medicare Other | Admitting: Internal Medicine

## 2020-07-30 VITALS — BP 157/80 | HR 57 | Resp 16 | Ht 63.0 in | Wt 107.0 lb

## 2020-07-30 DIAGNOSIS — J479 Bronchiectasis, uncomplicated: Secondary | ICD-10-CM

## 2020-07-30 DIAGNOSIS — A310 Pulmonary mycobacterial infection: Secondary | ICD-10-CM

## 2020-07-30 DIAGNOSIS — J301 Allergic rhinitis due to pollen: Secondary | ICD-10-CM

## 2020-07-30 DIAGNOSIS — E538 Deficiency of other specified B group vitamins: Secondary | ICD-10-CM | POA: Diagnosis not present

## 2020-07-30 DIAGNOSIS — I10 Essential (primary) hypertension: Secondary | ICD-10-CM

## 2020-07-30 DIAGNOSIS — R634 Abnormal weight loss: Secondary | ICD-10-CM | POA: Diagnosis not present

## 2020-07-30 MED ORDER — METHYLPREDNISOLONE 4 MG PO TBPK: ORAL_TABLET | ORAL | 0 refills | Status: DC

## 2020-07-30 NOTE — Assessment & Plan Note (Signed)
Off abx now Seeing Dr Linus Salmons later today

## 2020-07-30 NOTE — Progress Notes (Signed)
Subjective:  Patient ID: Lindsay Schultz, female    DOB: [birth year]  Age: 70 y.o. MRN: [id number]  CC: Follow-up (1 month f/u)   HPI Lindsay Schultz presents for allergies - much worse; bronchiectases, HTN f/u. Off abx now  Outpatient Medications Prior to Visit  Medication Sig Dispense Refill  . acetaminophen (TYLENOL) 500 MG tablet Take 500-1,000 mg by mouth every 6 (six) hours as needed (for pain.).    Marland Kitchen albuterol (VENTOLIN HFA) 108 (90 Base) MCG/ACT inhaler Inhale 2 puffs into the lungs every 6 (six) hours as needed for wheezing or shortness of breath. 18 g 11  . amLODipine (NORVASC) 2.5 MG tablet Take 1 tablet (2.5 mg total) by mouth daily. 90 tablet 3  . butalbital-acetaminophen-caffeine (FIORICET) 50-325-40 MG tablet Take 1 tablet by mouth every 6 (six) hours as needed for headache. 60 tablet 1  . calcitonin, salmon, (MIACALCIN/FORTICAL) 200 UNIT/ACT nasal spray Place 1 spray into alternate nostrils daily. 3.7 mL 11  . Carboxymethylcellulose Sodium 0.25 % SOLN Place 1 drop into both eyes 3 (three) times daily as needed (for dry/irritated eyes.).    Marland Kitchen cholecalciferol (VITAMIN D) 1000 units tablet Take 1,000 Units by mouth 2 (two) times daily.    Marland Kitchen CRANBERRY PO Take 1 capsule by mouth every evening.    . Cyanocobalamin (VITAMIN B-12) 500 MCG SUBL 1 tab sl qd (Patient taking differently: Take 500 mcg by mouth 2 (two) times daily.) 100 tablet 11  . famotidine (PEPCID) 20 MG tablet Take 20 mg by mouth daily.    . fluticasone (FLONASE) 50 MCG/ACT nasal spray Place 2 sprays into both nostrils daily as needed. 16 g 5  . Ginseng (GIN-ZING) 100 MG CAPS Take by mouth daily.    . Lactobacillus (ACIDOPHILUS PO) Take 1 capsule by mouth every evening.    . nadolol (CORGARD) 40 MG tablet Take 0.5 tablets (20 mg total) by mouth 2 (two) times daily. 30 tablet 11  . pantoprazole (PROTONIX) 40 MG tablet Take 1 tablet (40 mg total) by mouth daily. 30 tablet 11  . sodium chloride HYPERTONIC 3 %  nebulizer solution Take by nebulization in the morning and at bedtime. 240 mL 5  . TART CHERRY PO Take by mouth daily.    . benzonatate (TESSALON) 100 MG capsule Take 1 capsule (100 mg total) by mouth 2 (two) times daily as needed for cough. (Patient not taking: Reported on 07/30/2020) 60 capsule 0   No facility-administered medications prior to visit.    ROS: Review of Systems  Constitutional: Positive for fatigue. Negative for activity change, appetite change, chills and unexpected weight change.  HENT: Positive for congestion, postnasal drip, rhinorrhea and sinus pressure. Negative for mouth sores.   Eyes: Negative for visual disturbance.  Respiratory: Positive for cough. Negative for chest tightness.   Gastrointestinal: Negative for abdominal pain and nausea.  Genitourinary: Negative for difficulty urinating, frequency and vaginal pain.  Musculoskeletal: Negative for back pain and gait problem.  Skin: Negative for pallor and rash.  Neurological: Negative for dizziness, tremors, weakness, numbness and headaches.  Psychiatric/Behavioral: Negative for confusion and sleep disturbance.    Objective:  BP (!) 148/82 (BP Location: Left Arm)   Pulse (!) 54   Temp 98.2 F (36.8 C) (Oral)   Wt 104 lb 9.6 oz (47.4 kg)   SpO2 98%   BMI 18.53 kg/m   BP Readings from Last 3 Encounters:  07/30/20 (!) 148/82  07/22/20 (!) 150/80  05/07/20 (!) 156/69  Wt Readings from Last 3 Encounters:  07/30/20 104 lb 9.6 oz (47.4 kg)  07/22/20 107 lb 3.2 oz (48.6 kg)  05/07/20 104 lb 9.6 oz (47.4 kg)    Physical Exam Constitutional:      General: She is not in acute distress.    Appearance: She is well-developed. She is not ill-appearing.  HENT:     Head: Normocephalic.     Right Ear: External ear normal.     Left Ear: External ear normal.     Nose: Nose normal.  Eyes:     General:        Right eye: No discharge.        Left eye: No discharge.     Conjunctiva/sclera: Conjunctivae normal.      Pupils: Pupils are equal, round, and reactive to light.  Neck:     Thyroid: No thyromegaly.     Vascular: No JVD.     Trachea: No tracheal deviation.  Cardiovascular:     Rate and Rhythm: Normal rate and regular rhythm.     Heart sounds: Normal heart sounds.  Pulmonary:     Effort: No respiratory distress.     Breath sounds: No stridor. No wheezing.  Abdominal:     General: Bowel sounds are normal. There is no distension.     Palpations: Abdomen is soft. There is no mass.     Tenderness: There is no abdominal tenderness. There is no guarding or rebound.  Musculoskeletal:        General: No tenderness.     Cervical back: Normal range of motion and neck supple.  Lymphadenopathy:     Cervical: No cervical adenopathy.  Skin:    Findings: No erythema or rash.  Neurological:     Cranial Nerves: No cranial nerve deficit.     Motor: No abnormal muscle tone.     Coordination: Coordination normal.     Deep Tendon Reflexes: Reflexes normal.  Psychiatric:        Behavior: Behavior normal.        Thought Content: Thought content normal.        Judgment: Judgment normal.    Swollen mucosa   Lab Results  Component Value Date   WBC 5.6 05/07/2020   HGB 12.8 05/07/2020   HCT 37.7 05/07/2020   PLT 174 05/07/2020   GLUCOSE 82 05/07/2020   CHOL 217 (H) 05/07/2020   TRIG 131 05/07/2020   HDL 59 05/07/2020   LDLDIRECT 140.0 12/13/2017   LDLCALC 133 (H) 05/07/2020   ALT 12 05/07/2020   AST 21 05/07/2020   NA 142 05/07/2020   K 3.8 05/07/2020   CL 104 05/07/2020   CREATININE 0.66 05/07/2020   BUN 12 05/07/2020   CO2 32 05/07/2020   TSH 1.61 05/07/2020   INR 1.05 01/26/2016    CT Chest Wo Contrast  Result Date: 06/20/2020 CLINICAL DATA:  Persistent cough, bronchiectasis, MAI EXAM: CT CHEST WITHOUT CONTRAST TECHNIQUE: Multidetector CT imaging of the chest was performed following the standard protocol without IV contrast. COMPARISON:  01/29/2020, 07/25/2019 FINDINGS:  Cardiovascular: Aortic atherosclerosis. Normal heart size. Left coronary artery calcifications. No pericardial effusion. Mediastinum/Nodes: No enlarged mediastinal, hilar, or axillary lymph nodes. Status post left lobe thyroidectomy. Trachea, and esophagus demonstrate no significant findings. Lungs/Pleura: Redemonstrated varicoid bronchiectasis throughout the lungs, most conspicuously in the lingula, right middle lobe, and posterior right upper lobe (series 3, image 47). Scattered bronchiolar plugging. There are areas of clustered nodularity, which are fluctuant in comparison  to prior examination, particularly notable are new areas of nodularity in the lateral segment right middle lobe (series 3, image 88) and in the dependent right lung base (series 3, image 122). Overall quantity of nodules is increased compared to prior examination. No pleural effusion or pneumothorax. Upper Abdomen: No acute abnormality. Musculoskeletal: No chest wall mass or suspicious bone lesions identified. IMPRESSION: 1. Redemonstrated varicoid bronchiectasis throughout the lungs, most conspicuously in the lingula, right middle lobe, and posterior right upper lobe. Scattered bronchiolar plugging. There are areas of clustered nodularity, which are fluctuant in comparison to prior examination. Overall quantity of nodules is increased compared to prior examination. Findings are consistent with ongoing, worsened worsened atypical infection, particularly atypical Mycobacterium. 2. Coronary artery disease. Aortic Atherosclerosis (ICD10-I70.0). Electronically Signed   By: Eddie Candle M.D.   On: 06/20/2020 14:01    Assessment & Plan:    Walker Kehr, MD

## 2020-07-30 NOTE — Assessment & Plan Note (Signed)
Overall her pulmonary status is improving with pulmonary rehab and she will continue this.

## 2020-07-30 NOTE — Assessment & Plan Note (Signed)
Wt Readings from Last 3 Encounters:  07/30/20 104 lb 9.6 oz (47.4 kg)  07/22/20 107 lb 3.2 oz (48.6 kg)  05/07/20 104 lb 9.6 oz (47.4 kg)

## 2020-07-30 NOTE — Assessment & Plan Note (Signed)
On B12 

## 2020-07-30 NOTE — Assessment & Plan Note (Signed)
Ok from an ID standpoint at this time to take the steroids prescribed today.  She will fill.

## 2020-07-30 NOTE — Assessment & Plan Note (Signed)
Worse seasonally Medrol pack if ok w/Dr Linus Salmons

## 2020-07-30 NOTE — Progress Notes (Signed)
   Subjective:    Patient ID: Lindsay Schultz, female    DOB: 12-May-1950, 70 y.o.   MRN: [id number]  HPI Here for follow up of bronchiectasis and pulmonary Mycobacterial infection.   She completed 18 months of treatment with 3 drug therapy (rif/azithromycin/ethambutol) and the last sputum checked in June 2021 was negative for Mycobacteria.  She felt better after treatment and still with infrequent exacerbations.  She has been in pulmonary rehab in [place]. She had recent follow up with Dr. Vaughan Browner and a repeat CT with some increased nodules and bronchiolar plugging.  She is having no new issues, same sob but overall remains improved. No fever, no wieght loss.    Review of Systems  Constitutional: Negative for chills, fever and unexpected weight change.  Respiratory: Positive for cough. Negative for shortness of breath.        Occasional cough, occasional brown sputum  Skin: Negative for rash.       Objective:   Physical Exam Eyes:     General: No scleral icterus. Pulmonary:     Effort: Pulmonary effort is normal. No respiratory distress.     Breath sounds: Normal breath sounds.  Skin:    Findings: No rash.  Neurological:     Mental Status: She is alert.  Psychiatric:        Mood and Affect: Mood normal.   SH: no tobacco        Assessment & Plan:

## 2020-07-30 NOTE — Assessment & Plan Note (Signed)
At this time, clinically she is having no worsening of her cough, sob or more frequent exacerbations.  CT scan noted and personally reviewed and is a bit worse with nodules.  I would like to recheck her sputum and see if she has a positive smear and /or culture and if the burden of infection is high.  If negative, will consider continued watchful waiting unless she clinically worsens.  If it is positive, will consider retreatment including with IV or inhaled amikacin.

## 2020-07-30 NOTE — Assessment & Plan Note (Signed)
On Nadolol

## 2020-08-21 ENCOUNTER — Telehealth: Payer: Self-pay

## 2020-08-21 NOTE — Telephone Encounter (Signed)
Received notification from [facility] that patient's sputum sample was not adequate. RN spoke to patient, she will go to the [facility] on [facility] in [place] to pick up a collection cup and drop off her sample.   RN confirmed with [facility] that they should be able to provide the patient with a sputum cup. RN faxed orders for AFB sputum smear and culture per Dr. Linus Salmons to [facility] at [phone].  Beryle Flock, RN

## 2020-10-15 ENCOUNTER — Other Ambulatory Visit: Payer: Self-pay | Admitting: *Deleted

## 2020-10-15 MED ORDER — NADOLOL 40 MG PO TABS: 20.0000 mg | ORAL_TABLET | Freq: Two times a day (BID) | ORAL | 11 refills | Status: DC

## 2020-10-15 MED ORDER — PANTOPRAZOLE SODIUM 40 MG PO TBEC: 40.0000 mg | DELAYED_RELEASE_TABLET | Freq: Every day | ORAL | 11 refills | Status: DC

## 2020-10-21 ENCOUNTER — Telehealth: Payer: Self-pay | Admitting: Internal Medicine

## 2020-10-21 MED ORDER — CALCITONIN (SALMON) 200 UNIT/ACT NA SOLN: 1.0000 | Freq: Every day | NASAL | 5 refills | Status: DC

## 2020-10-21 MED ORDER — FLUTICASONE PROPIONATE 50 MCG/ACT NA SUSP: 2.0000 | Freq: Every day | NASAL | 5 refills | Status: DC | PRN

## 2020-10-21 NOTE — Telephone Encounter (Signed)
1.Medication Requested: nadolol (CORGARD) 40 MG tablet  pantoprazole (PROTONIX) 40 MG tablet  calcitonin, salmon, (MIACALCIN/FORTICAL) 200 UNIT/ACT nasal spray  fluticasone (FLONASE) 50 MCG/ACT nasal spray   2. Pharmacy (Name, Street, [place]): [facility], [place]  3. On Med List: yes   4. Last Visit with PCP: 07-30-20  5. Next visit date with PCP: 01-29-21   Agent: Please be advised that RX refills may take up to 3 business days. We ask that you follow-up with your pharmacy.

## 2020-10-21 NOTE — Telephone Encounter (Signed)
Already sent pantoprazole & Nadolol on 10/15/20. Sent refills on the nasal spray.Marland KitchenJohny Chess

## 2020-11-02 ENCOUNTER — Encounter (HOSPITAL_COMMUNITY): Payer: Self-pay

## 2020-11-02 ENCOUNTER — Emergency Department (HOSPITAL_COMMUNITY)
Admission: EM | Admit: 2020-11-02 | Discharge: 2020-11-02 | Disposition: A | Discharge status: Home / Self Care | Payer: Medicare Other | Attending: Emergency Medicine | Admitting: Emergency Medicine

## 2020-11-02 ENCOUNTER — Emergency Department (HOSPITAL_COMMUNITY): Payer: Medicare Other

## 2020-11-02 DIAGNOSIS — R1011 Right upper quadrant pain: Secondary | ICD-10-CM | POA: Diagnosis not present

## 2020-11-02 DIAGNOSIS — Z79899 Other long term (current) drug therapy: Secondary | ICD-10-CM | POA: Diagnosis not present

## 2020-11-02 DIAGNOSIS — Z853 Personal history of malignant neoplasm of breast: Secondary | ICD-10-CM | POA: Insufficient documentation

## 2020-11-02 DIAGNOSIS — K219 Gastro-esophageal reflux disease without esophagitis: Secondary | ICD-10-CM | POA: Diagnosis not present

## 2020-11-02 DIAGNOSIS — I10 Essential (primary) hypertension: Secondary | ICD-10-CM | POA: Insufficient documentation

## 2020-11-02 DIAGNOSIS — R1013 Epigastric pain: Secondary | ICD-10-CM | POA: Diagnosis not present

## 2020-11-02 DIAGNOSIS — R1010 Upper abdominal pain, unspecified: Secondary | ICD-10-CM

## 2020-11-02 LAB — CBC WITH DIFFERENTIAL/PLATELET
Abs Immature Granulocytes: 0.01 10*3/uL (ref 0.00–0.07)
Basophils Absolute: 0 10*3/uL (ref 0.0–0.1)
Basophils Relative: 1 %
Eosinophils Absolute: 0.1 10*3/uL (ref 0.0–0.5)
Eosinophils Relative: 2 %
HCT: 41.8 % (ref 36.0–46.0)
Hemoglobin: 13.9 g/dL (ref 12.0–15.0)
Immature Granulocytes: 0 %
Lymphocytes Relative: 22 %
Lymphs Abs: 1.3 10*3/uL (ref 0.7–4.0)
MCH: 30.8 pg (ref 26.0–34.0)
MCHC: 33.3 g/dL (ref 30.0–36.0)
MCV: 92.5 fL (ref 80.0–100.0)
Monocytes Absolute: 0.5 10*3/uL (ref 0.1–1.0)
Monocytes Relative: 8 %
Neutro Abs: 4.2 10*3/uL (ref 1.7–7.7)
Neutrophils Relative %: 67 %
Platelets: 187 10*3/uL (ref 150–400)
RBC: 4.52 MIL/uL (ref 3.87–5.11)
RDW: 12.5 % (ref 11.5–15.5)
WBC: 6.2 10*3/uL (ref 4.0–10.5)
nRBC: 0 % (ref 0.0–0.2)

## 2020-11-02 LAB — COMPREHENSIVE METABOLIC PANEL
ALT: 16 U/L (ref 0–44)
AST: 23 U/L (ref 15–41)
Albumin: 4.8 g/dL (ref 3.5–5.0)
Alkaline Phosphatase: 98 U/L (ref 38–126)
Anion gap: 9 (ref 5–15)
BUN: 10 mg/dL (ref 8–23)
CO2: 33 mmol/L — ABNORMAL HIGH (ref 22–32)
Calcium: 9.7 mg/dL (ref 8.9–10.3)
Chloride: 103 mmol/L (ref 98–111)
Creatinine, Ser: 0.81 mg/dL (ref 0.44–1.00)
GFR, Estimated: >60 mL/min (ref >60)
Glucose, Bld: 92 mg/dL (ref 70–99)
Potassium: 3.5 mmol/L (ref 3.5–5.1)
Sodium: 145 mmol/L (ref 135–145)
Total Bilirubin: 0.8 mg/dL (ref 0.3–1.2)
Total Protein: 7.6 g/dL (ref 6.5–8.1)

## 2020-11-02 LAB — LIPASE, BLOOD: Lipase: 23 U/L (ref 11–51)

## 2020-11-02 MED ORDER — MORPHINE SULFATE (PF) 4 MG/ML IV SOLN
4.0000 mg | Freq: Once | INTRAVENOUS | Status: AC
  Administered 2020-11-02: 4 mg via INTRAVENOUS
  Filled 2020-11-02: qty 1

## 2020-11-02 MED ORDER — LIDOCAINE VISCOUS HCL 2 % MT SOLN
15.0000 mL | Freq: Once | OROMUCOSAL | Status: AC
  Administered 2020-11-02: 15 mL via ORAL
  Filled 2020-11-02: qty 15

## 2020-11-02 MED ORDER — DICYCLOMINE HCL 20 MG PO TABS: 20.0000 mg | ORAL_TABLET | Freq: Two times a day (BID) | ORAL | 0 refills | Status: DC | PRN

## 2020-11-02 MED ORDER — ALUM & MAG HYDROXIDE-SIMETH 200-200-20 MG/5ML PO SUSP
30.0000 mL | Freq: Once | ORAL | Status: AC
  Administered 2020-11-02: 30 mL via ORAL
  Filled 2020-11-02: qty 30

## 2020-11-02 MED ORDER — SUCRALFATE 1 GM/10ML PO SUSP: 1.0000 g | Freq: Three times a day (TID) | ORAL | 0 refills | Status: DC

## 2020-11-02 MED ORDER — ONDANSETRON HCL 4 MG/2ML IJ SOLN
4.0000 mg | Freq: Once | INTRAMUSCULAR | Status: AC
  Administered 2020-11-02: 4 mg via INTRAVENOUS
  Filled 2020-11-02: qty 2

## 2020-11-02 NOTE — ED Provider Notes (Signed)
I provided a substantive portion of the care of this patient.  I personally performed the entirety of the medical decision making for this encounter.      70 year old female presents with her upper quadrant abdominal pain.  Labs and abdominal ultrasound reassuring here.  Likely GERD.  No evidence of acute abdomen.  Will discharge   Lacretia Leigh, MD 11/02/20 1219

## 2020-11-02 NOTE — Discharge Instructions (Addendum)
Continue taking medications as prescribed. Use Carafate before meals and before bed to help with pain. Use Bentyl to help with abdominal cramping or pain. You may also continue to take tylenol as needed for pain.  Follow up with your primary care doctor in 1 week for recheck of symptoms.  Return to the ER if you develop fevers, persistent vomiting, severe worsening pain, blood in your stool, or any new, worsening, or concerning symptoms.

## 2020-11-02 NOTE — ED Triage Notes (Signed)
Pt c/o upper abdominal pain and bloating x 4 days.  Pain score 6/10. Pt reports symptoms increase after eating.  Diarrhea x 4 days ago which has resolved w/ OTC medications.  Pt reports taking medication for acid reflux.

## 2020-11-02 NOTE — ED Provider Notes (Signed)
[facility] DEPT Provider Note   CSN: [id number] Arrival date & time: 11/02/20  1015     History Chief Complaint  Patient presents with   Abdominal Pain   Bloated    Lindsay Schultz is a 70 y.o. female presenting for evaluation of abdominal pain.  Patient states she has been having issues with her stomach for the past 6 days.  Initially she just had diarrhea, which resolved with Imodium and Pepto-Bismol.  However the past couple days, she has had sharp upper abdominal pain which is constant, worsening after eating.  She also has lower abdominal discomfort, which she describes as a soreness/cramping, which is worse when having a bowel movement.  She denies fevers, chills, chest pain, shortness of breath, cough, urinary symptoms.  Previous history of appendectomy, no other abdominal surgeries.  She has not seen a GI doctor in many years, had a colonoscopy and EGD in her 45s, none since.  She has tried Pepto-Bismol, Tums, continues to take Protonix daily.  She has not had any relief with these medicines.  Of note, patient has been diagnosed with MAC infection of the lungs.  She will is on long-term antibiotics, this was discontinued over a year ago.  She has not been on any antibiotics recently.  Additional history obtained from chart review.  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   Low back pain 09/03/2018   Constipation 04/17/2018   Insomnia 04/17/2018   Weight loss 04/17/2018   Pulmonary mycobacterial  infection ([place]) 03/27/2018   Medication monitoring encounter 03/27/2018   Bronchiectasis without complication ([place])    Thrush 12/26/2017   Ear pain, right 06/08/2016   Hemoptysis 01/24/2016   Bronchiectasis with (acute) exacerbation ([place]) 01/12/2016   Well adult exam 11/07/2015   Benign paroxysmal positional vertigo 06/14/2015   Rash 10/30/2014   Pain in both feet 09/26/2012   Bilateral hand pain 09/26/2012   B12 deficiency 08/23/2012   Paresthesia and pain of extremity 08/22/2012   LBP radiating to right leg 03/27/2012   Herpes zoster infection 03/27/2012   Need for prophylactic vaccination and inoculation against other viral diseases(V04.89) 02/15/2012   Trigeminal neuralgia pain 01/19/2012   Skin rash 02/19/2011   Neoplasm of uncertain behavior of skin 12/12/2009   CERUMEN IMPACTION 12/12/2009   SHOULDER PAIN 01/29/2009   Headache(784.0) 12/11/2008   Essential hypertension 06/12/2007   Allergic rhinitis 06/12/2007   GERD (gastroesophageal reflux disease) 06/12/2007   Disorder of bone and cartilage 06/12/2007   BREAST CANCER, HX OF 06/12/2007    Past Surgical History:  Procedure Laterality Date   ABDOMINAL HYSTERECTOMY     APPENDECTOMY     BREAST LUMPECTOMY  2008   Left   NASAL SINUS SURGERY  1997   VIDEO BRONCHOSCOPY Bilateral 02/21/2018   Procedure: VIDEO BRONCHOSCOPY WITHOUT FLUORO;  Surgeon: Marshell Garfinkel, MD;  Location: [facility] END[facility]COPY;  Service: Cardiopulmonary;  Laterality: Bilateral;     OB History   No obstetric history on file.  Family History  Problem Relation Age of Onset   Sjogren's syndrome Mother    Arthritis Father 67       RA   Emphysema Father    Heart disease Father    Hypertension Other    Hodgkin's lymphoma Brother     Social History   Tobacco Use   Smoking status: Never   Smokeless tobacco: Never  Substance Use Topics   Alcohol use: No    Alcohol/week: 0.0 standard drinks   Drug use: No    Home Medications Prior to  Admission medications   Medication Sig Start Date End Date Taking? Authorizing Provider  acetaminophen (TYLENOL) 500 MG tablet Take 500-1,000 mg by mouth every 6 (six) hours as needed (for pain.).    [provider]  albuterol (VENTOLIN HFA) 108 (90 Base) MCG/ACT inhaler Inhale 2 puffs into the lungs every 6 (six) hours as needed for wheezing or shortness of breath. 08/14/19   Mannam, Praveen, MD  amLODipine (NORVASC) 2.5 MG tablet Take 1 tablet (2.5 mg total) by mouth daily. 06/16/20   Plotnikov, Evie Lacks, MD  butalbital-acetaminophen-caffeine (FIORICET) [phone] MG tablet Take 1 tablet by mouth every 6 (six) hours as needed for headache. 06/06/20   Plotnikov, Evie Lacks, MD  calcitonin, salmon, (MIACALCIN/FORTICAL) 200 UNIT/ACT nasal spray Place 1 spray into alternate nostrils daily. 10/21/20   Plotnikov, Evie Lacks, MD  Carboxymethylcellulose Sodium 0.25 % SOLN Place 1 drop into both eyes 3 (three) times daily as needed (for dry/irritated eyes.).    [provider]  cholecalciferol (VITAMIN D) 1000 units tablet Take 1,000 Units by mouth 2 (two) times daily.    [provider]  CRANBERRY PO Take 1 capsule by mouth every evening.    [provider]  Cyanocobalamin (VITAMIN B-12) 500 MCG SUBL 1 tab sl qd Patient taking differently: Take 500 mcg by mouth 2 (two) times daily. 08/23/12   Plotnikov, Evie Lacks, MD  famotidine (PEPCID) 20 MG tablet Take 20 mg by mouth daily.    [provider]  fluticasone (FLONASE) 50 MCG/ACT nasal spray Place 2 sprays into both nostrils daily as needed. 10/21/20   Plotnikov, Evie Lacks, MD  Ginseng (GIN-ZING) 100 MG CAPS Take by mouth daily.    [provider]  Lactobacillus (ACIDOPHILUS PO) Take 1 capsule by mouth every evening.    [provider]  nadolol (CORGARD) 40 MG tablet Take 0.5 tablets (20 mg total) by mouth 2 (two) times daily. 10/15/20   Plotnikov, Evie Lacks, MD  pantoprazole (PROTONIX) 40 MG tablet Take 1  tablet (40 mg total) by mouth daily. 10/15/20   Plotnikov, Evie Lacks, MD  sodium chloride HYPERTONIC 3 % nebulizer solution Take by nebulization in the morning and at bedtime. 02/21/20   Mannam, Hart Robinsons, MD  TART CHERRY PO Take by mouth daily.    [provider]    Allergies    Meloxicam, Paroxetine, Penicillins, Zanaflex [tizanidine hcl], Ciprofloxacin, Clarithromycin, and Zithromax [azithromycin]  Review of Systems   Review of Systems  Gastrointestinal:  Positive for abdominal distention, abdominal pain, diarrhea (resolved) and nausea.  All other systems reviewed and are negative.  Physical Exam Updated Vital Signs BP (!) 188/85 (BP Location: Left Arm)   Pulse (!) 53   Temp (!) 97.4 F (36.3 C) (Oral)   Resp 14   Ht _0  (1.6 m)   Wt 48.1 kg   BMI 18.78 kg/m   Physical Exam Vitals and nursing note reviewed.  Constitutional:  General: She is not in acute distress.    Appearance: Normal appearance.     Comments: nontoxic  HENT:     Head: Normocephalic and atraumatic.  Eyes:     Conjunctiva/sclera: Conjunctivae normal.     Pupils: Pupils are equal, round, and reactive to light.  Cardiovascular:     Rate and Rhythm: Normal rate and regular rhythm.     Pulses: Normal pulses.  Pulmonary:     Effort: Pulmonary effort is normal. No respiratory distress.     Breath sounds: Normal breath sounds. No wheezing.     Comments: Speaking in full sentences.  Clear lung sounds in all fields. Abdominal:     General: There is no distension.     Palpations: Abdomen is soft. There is no mass.     Tenderness: There is abdominal tenderness. There is no guarding or rebound.     Comments: Diffuse mild tenderness palpation of the abdomen with more severe pain in the epigastric and right upper quadrant abdomen.  Positive Murphy sign.  No abdominal distention.  Negative rebound.  Musculoskeletal:        General: Normal range of motion.     Cervical back: Normal range of motion and  neck supple.  Skin:    General: Skin is warm and dry.     Capillary Refill: Capillary refill takes less than 2 seconds.  Neurological:     Mental Status: She is alert and oriented to person, place, and time.  Psychiatric:        Mood and Affect: Mood and affect normal.        Speech: Speech normal.        Behavior: Behavior normal.    ED Results / Procedures / Treatments   Labs (all labs ordered are listed, but only abnormal results are displayed) Labs Reviewed  CBC WITH DIFFERENTIAL/PLATELET  COMPREHENSIVE METABOLIC PANEL  LIPASE, BLOOD  URINALYSIS, ROUTINE W REFLEX MICROSCOPIC    EKG None  Radiology No results found.  Procedures Procedures   Medications Ordered in ED Medications  ondansetron (ZOFRAN) injection 4 mg (has no administration in time range)  morphine 4 MG/ML injection 4 mg (has no administration in time range)    ED Course  I have reviewed the triage vital signs and the nursing notes.  Pertinent labs & imaging results that were available during my care of the patient were reviewed by me and considered in my medical decision making (see chart for details).    MDM Rules/Calculators/A&P                          Patient presenting for evaluation of abdominal pain.  On exam, patient appears nontoxic.  She does have diffuse tenderness palpation the abdomen, worse in epigastric and right upper quadrant areas.  As symptoms are worse after eating, consider gallbladder etiology.  However as patient also recently had diarrhea, consider viral GI illness with associated gastritis.  Will obtain labs, right upper quadrant ultrasound, treat symptomatically, and reassess.  Ultrasound negative for acute findings.  Labs interpreted by me, overall reassuring.  Kidney, liver, pancreatic function normal.  No leukocytosis.  On reevaluation, patient reports improvement of symptoms.  I discussed that at this time, there does not appear to be an acute life-threatening condition  requiring surgery or hospitalization.  Abdominal exam remains benign.  Discussed symptomatic management and follow-up with PCP.  At this time, patient appears safe for discharge.  Return precautions given.  Patient states she understands and agrees to plan.   Final Clinical Impression(s) / ED Diagnoses Final diagnoses:  Upper abdominal pain    Rx / DC Orders ED Discharge Orders     None        Franchot Heidelberg, PA-C 11/02/20 1323    Lacretia Leigh, MD 11/02/20 1422

## 2020-11-04 ENCOUNTER — Ambulatory Visit: Payer: Medicare Other | Admitting: Internal Medicine

## 2020-11-11 ENCOUNTER — Other Ambulatory Visit: Payer: Self-pay | Admitting: Internal Medicine

## 2020-11-11 DIAGNOSIS — Z1231 Encounter for screening mammogram for malignant neoplasm of breast: Secondary | ICD-10-CM

## 2020-11-12 ENCOUNTER — Other Ambulatory Visit: Payer: Self-pay

## 2020-11-12 ENCOUNTER — Encounter: Payer: Self-pay | Admitting: Internal Medicine

## 2020-11-12 ENCOUNTER — Encounter: Payer: Self-pay | Admitting: Pulmonary Disease

## 2020-11-12 ENCOUNTER — Ambulatory Visit (INDEPENDENT_AMBULATORY_CARE_PROVIDER_SITE_OTHER): Payer: Medicare Other | Admitting: Internal Medicine

## 2020-11-12 ENCOUNTER — Ambulatory Visit (INDEPENDENT_AMBULATORY_CARE_PROVIDER_SITE_OTHER): Payer: Medicare Other | Admitting: Pulmonary Disease

## 2020-11-12 VITALS — BP 114/66 | HR 65 | Ht 62.0 in | Wt 107.2 lb

## 2020-11-12 DIAGNOSIS — R1084 Generalized abdominal pain: Secondary | ICD-10-CM

## 2020-11-12 DIAGNOSIS — A310 Pulmonary mycobacterial infection: Secondary | ICD-10-CM

## 2020-11-12 DIAGNOSIS — D580 Hereditary spherocytosis: Secondary | ICD-10-CM | POA: Diagnosis not present

## 2020-11-12 DIAGNOSIS — J479 Bronchiectasis, uncomplicated: Secondary | ICD-10-CM | POA: Diagnosis not present

## 2020-11-12 DIAGNOSIS — E538 Deficiency of other specified B group vitamins: Secondary | ICD-10-CM

## 2020-11-12 DIAGNOSIS — R634 Abnormal weight loss: Secondary | ICD-10-CM | POA: Diagnosis not present

## 2020-11-12 MED ORDER — SODIUM CHLORIDE 3 % IN NEBU
INHALATION_SOLUTION | Freq: Two times a day (BID) | RESPIRATORY_TRACT | 3 refills | Status: DC
Start: 2020-11-12 — End: 2020-12-15

## 2020-11-12 MED ORDER — HYDROCODONE-ACETAMINOPHEN 5-325 MG PO TABS: 1.0000 | ORAL_TABLET | Freq: Four times a day (QID) | ORAL | 0 refills | Status: DC | PRN

## 2020-11-12 NOTE — Assessment & Plan Note (Addendum)
Possible trait - fam history.  Monitor CBC

## 2020-11-12 NOTE — Progress Notes (Signed)
Felipe Cabell Cogbill    [id number]    01-28-1951  Primary Care Physician:Plotnikov, Evie Lacks, MD  Referring Physician: Cassandria Anger, MD [address] [place],  [place] [address]  Chief complaint:   Follow up for Bronchiectasis with MAI infection Sinusitis  Seasonal allergies GERD  HPI: Mrs. Hannold is a 70 year old female with history of asthmatic bronchitis, sinusitis, and seasonal allergies.   She's had a workup for her bronchiectasis including CBC, Ig levels, sputum cultures, A1AT levels, RF which were normal. PFTs do not show any obstruction and very mild restriction and reduction in diffusion capacity.   Underwent bronchoscopy on 12/22/2017 with cultures growing MAI She has been seen by ID and started on antibiotic therapy. Managed by Dr. Linus Salmons Completed 18 months of therapy.  Taken off antimicrobial therapy on June 2021 with intention to follow-up with CT. Per ID note if things worsen either clinically or radiographically + clinically, will consider restarting 3 drug therapy + three times weekly amikacin.    Had a mild bronchiectasis flare in January 2022 requiring prednisone for 5 days Finished pulmonary rehab in [place] in early 2022  She has attacks of bronchiectasis exacerbations about once a year. She states that the infection usually begins in the sinus and then moves into the lung. This usually resolves after course of antibiotics. She has H/O seasonal allergies (worst in fall and spring) and asthmatic bronchitis. Never smoked.   Interim history: Breathing is improved since last visit.  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  Outpatient Encounter Medications as of 11/12/2020  Medication Sig   acetaminophen (TYLENOL) 500 MG tablet Take 500-1,000 mg by mouth every 6 (six) hours as needed (for pain.).   albuterol (VENTOLIN HFA) 108 (90 Base) MCG/ACT inhaler  Inhale 2 puffs into the lungs every 6 (six) hours as needed for wheezing or shortness of breath.   amLODipine (NORVASC) 2.5 MG tablet Take 1 tablet (2.5 mg total) by mouth daily.   butalbital-acetaminophen-caffeine (FIORICET) 50-325-40 MG tablet Take 1 tablet by mouth every 6 (six) hours as needed for headache.   calcitonin, salmon, (MIACALCIN/FORTICAL) 200 UNIT/ACT nasal spray Place 1 spray into alternate nostrils daily.   Carboxymethylcellulose Sodium 0.25 % SOLN Place 1 drop into both eyes 3 (three) times daily as needed (for dry/irritated eyes.).   cholecalciferol (VITAMIN D) 1000 units tablet Take 1,000 Units by mouth 2 (two) times daily.   CRANBERRY PO Take 1 capsule by mouth every evening.   Cyanocobalamin (VITAMIN B-12) 500 MCG SUBL 1 tab sl qd (Patient taking differently: Take 500 mcg by mouth 2 (two) times daily.)   famotidine (PEPCID) 20 MG tablet Take 20 mg by mouth daily.   fluticasone (FLONASE) 50 MCG/ACT nasal spray Place 2 sprays into both nostrils daily as needed. (Patient taking differently: Place 2 sprays into both nostrils daily as needed for allergies.)   fluticasone (FLONASE) 50 MCG/ACT nasal spray Place 2 sprays into the nose daily.   Ginseng (GIN-ZING) 100 MG CAPS Take 100 mg by mouth daily.   nadolol (CORGARD) 40 MG tablet Take 0.5 tablets (20 mg total) by mouth 2 (two) times daily.   pantoprazole (PROTONIX) 40 MG tablet Take 1 tablet (40 mg total) by mouth daily.   sodium chloride HYPERTONIC 3 % nebulizer solution Take by nebulization in the morning and at bedtime. (Patient taking differently: Take 4 mLs  by nebulization in the morning and at bedtime.)   TART CHERRY PO Take 1 tablet by mouth daily.   [DISCONTINUED] dicyclomine (BENTYL) 20 MG tablet Take 1 tablet (20 mg total) by mouth 2 (two) times daily as needed for spasms.   [DISCONTINUED] Lactobacillus (ACIDOPHILUS PO) Take 1 capsule by mouth every evening.   [DISCONTINUED] sucralfate (CARAFATE) 1 GM/10ML suspension  Take 10 mLs (1 g total) by mouth 4 (four) times daily -  with meals and at bedtime.   No facility-administered encounter medications on file as of 11/12/2020.   Physical Exam: Blood pressure 114/66, pulse 65, height _0  (1.575 m), weight 107 lb 3.2 oz (48.6 kg), SpO2 98 %. Gen:      No acute distress HEENT:  EOMI, sclera anicteric Neck:     No masses; no thyromegaly Lungs:    Clear to auscultation bilaterally; normal respiratory effort CV:         Regular rate and rhythm; no murmurs Abd:      + bowel sounds; soft, non-tender; no palpable masses, no distension Ext:    No edema; adequate peripheral perfusion Skin:      Warm and dry; no rash Neuro: alert and oriented x 3 Psych: normal mood and affect   Data Reviewed: Imaging CT scan 11/15/14-Multiple nodules in the left upper lobe, bilateral bronchiectasis. CT scan 01/24/16 Stable pulmonary nodules, bilateral bronchiectasis. Left upper lobe ground glass opacities. Images reviewed. CT scan 11/16/2017- stable pulmonary nodules, bilateral bronchiectasis, new area of opacity in the right upper lobe consistent with mucus impaction.  CT chest 07/25/2019-stable bronchiectasis with mild increase in tree-in-bud appearance in the right upper lobe.  Stable pulmonary nodules. CT chest 01/29/2020-slight interval worsening of tree-in-bud appearance with nodular consolidation in the apical right upper lobe. CT chest 06/19/2020-increased nodularity of the lungs, bronchiectasis I reviewed the images personally.  Labs 12/09/14 IgG-773 IgG 8-2 37 IgM-64 IgE-56 Rheumatoid factor < 10 All within normal limits.   AFB and fungal cultures 11/19/14- no acid-fast bacilli, Candida albicans. 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 Repeat sputum AFB 01/05/2018-negative  Bronchoscopy 02/21/2018 Cultures- Mycobacterium avium complex BAL cell count-WBC 635, 9% lymphs, 1% eos, 76% neutrophils   PFTs  02/10/15 FVC 2.35 (74%), FEV1 1.79 (74%), F/F  77, TLC 79%, DLCO 78% No obstruction. Mild reduction in lung volumes and diffusion capacity  Assessment:  Bronchiectasis, MAC infection Off treatment since June 2021 after completing 18 months of therapy CT scan shows increase in tree-in-bud and nodular opacities concerning for progression of MAI.  The changes are mild and clinically she seems to be about the same.  Discussed with Dr. Linus Salmons with ID.  We will continue to observe for now If she needs to go back on therapy then will need to use inhaled versus IV amikacin  Continue chest percussion with vest, hypertonic saline for mucociliary clearance.  Asthmatic  Bronchitis She is asymptomatic with symptoms mostly during spring and fall allergy. She'll continue the albuterol when necessary.  GERD Continue Zantac.  Health maintenance 02/07/2016-Prevnar 05/28/2014-Pneumovax  Up-to-date with flu and Covid.   Plan/Recommendations: - Continue albuterol as needed - Percussion vest, hypertonic saline  Marshell Garfinkel MD [facility] 11/12/2020, 11:59 AM  CC: Plotnikov, Evie Lacks, MD

## 2020-11-12 NOTE — Patient Instructions (Signed)
I am glad you are doing well with regard to your breathing  We will renew your saline nebulizers Continue the percussion vest  Order follow-up CT without contrast in March 2023 Follow-up in clinic after CT scan

## 2020-11-12 NOTE — Assessment & Plan Note (Addendum)
Wt Readings from Last 3 Encounters:  11/12/20 108 lb (49 kg)  11/12/20 107 lb 3.2 oz (48.6 kg)  11/02/20 106 lb (48.1 kg)  No wt loss recently.  Continue with good diet

## 2020-11-12 NOTE — Assessment & Plan Note (Signed)
  Recurrent once every 2-4 weeks, severe at times x 20 years - ?etiology Try Norco prn GI referral

## 2020-11-12 NOTE — Progress Notes (Signed)
Subjective:  Patient ID: Lindsay Schultz, female    DOB: [birth year]  Age: 70 y.o. MRN: [id number]  CC: Follow-up (ER F/U- Abdominal Pain)   HPI Lindsay Schultz presents for ER visit on 7/17 for epigastric pain, diarrhea 2-3/d, bloating. RESOLVED AFTER 7 DAYS Mother had her spleen removed for hereditary spherocytosis  Outpatient Medications Prior to Visit  Medication Sig Dispense Refill   acetaminophen (TYLENOL) 500 MG tablet Take 500-1,000 mg by mouth every 6 (six) hours as needed (for pain.).     albuterol (VENTOLIN HFA) 108 (90 Base) MCG/ACT inhaler Inhale 2 puffs into the lungs every 6 (six) hours as needed for wheezing or shortness of breath. 18 g 11   amLODipine (NORVASC) 2.5 MG tablet Take 1 tablet (2.5 mg total) by mouth daily. 90 tablet 3   butalbital-acetaminophen-caffeine (FIORICET) 50-325-40 MG tablet Take 1 tablet by mouth every 6 (six) hours as needed for headache. 60 tablet 1   calcitonin, salmon, (MIACALCIN/FORTICAL) 200 UNIT/ACT nasal spray Place 1 spray into alternate nostrils daily. 3.7 mL 5   Carboxymethylcellulose Sodium 0.25 % SOLN Place 1 drop into both eyes 3 (three) times daily as needed (for dry/irritated eyes.).     cholecalciferol (VITAMIN D) 1000 units tablet Take 1,000 Units by mouth 2 (two) times daily.     CRANBERRY PO Take 1 capsule by mouth every evening.     Cyanocobalamin (VITAMIN B-12) 500 MCG SUBL 1 tab sl qd (Patient taking differently: Take 500 mcg by mouth 2 (two) times daily.) 100 tablet 11   famotidine (PEPCID) 20 MG tablet Take 20 mg by mouth daily.     fluticasone (FLONASE) 50 MCG/ACT nasal spray Place 2 sprays into both nostrils daily as needed. (Patient taking differently: Place 2 sprays into both nostrils daily as needed for allergies.) 16 g 5   fluticasone (FLONASE) 50 MCG/ACT nasal spray Place 2 sprays into the nose daily.     Ginseng (GIN-ZING) 100 MG CAPS Take 100 mg by mouth daily.     nadolol (CORGARD) 40 MG tablet Take 0.5 tablets (20  mg total) by mouth 2 (two) times daily. 30 tablet 11   pantoprazole (PROTONIX) 40 MG tablet Take 1 tablet (40 mg total) by mouth daily. 30 tablet 11   sodium chloride HYPERTONIC 3 % nebulizer solution Take by nebulization in the morning and at bedtime. J47.9 720 mL 3   TART CHERRY PO Take 1 tablet by mouth daily.     No facility-administered medications prior to visit.    ROS: Review of Systems  Constitutional:  Negative for activity change, appetite change, chills, fatigue and unexpected weight change.  HENT:  Negative for congestion, mouth sores and sinus pressure.   Eyes:  Negative for visual disturbance.  Respiratory:  Negative for cough and chest tightness.   Gastrointestinal:  Positive for abdominal pain and diarrhea. Negative for nausea.  Genitourinary:  Negative for difficulty urinating, frequency and vaginal pain.  Musculoskeletal:  Negative for back pain and gait problem.  Skin:  Negative for pallor and rash.  Neurological:  Negative for dizziness, tremors, weakness, numbness and headaches.  Psychiatric/Behavioral:  Negative for confusion and sleep disturbance.    Objective:  BP (!) 152/80 (BP Location: Left Arm)   Pulse (!) 57   Temp 98.1 F (36.7 C) (Oral)   Wt 108 lb (49 kg)   SpO2 97%   BMI 19.75 kg/m   BP Readings from Last 3 Encounters:  11/12/20 (!) 152/80  11/12/20 114/66  11/02/20 (!) 149/68    Wt Readings from Last 3 Encounters:  11/12/20 108 lb (49 kg)  11/12/20 107 lb 3.2 oz (48.6 kg)  11/02/20 106 lb (48.1 kg)    Physical Exam Constitutional:      General: She is not in acute distress.    Appearance: She is well-developed.  HENT:     Head: Normocephalic.     Right Ear: External ear normal.     Left Ear: External ear normal.     Nose: Nose normal.  Eyes:     General:        Right eye: No discharge.        Left eye: No discharge.     Conjunctiva/sclera: Conjunctivae normal.     Pupils: Pupils are equal, round, and reactive to light.   Neck:     Thyroid: No thyromegaly.     Vascular: No JVD.     Trachea: No tracheal deviation.  Cardiovascular:     Rate and Rhythm: Normal rate and regular rhythm.     Heart sounds: Normal heart sounds.  Pulmonary:     Effort: No respiratory distress.     Breath sounds: No stridor. No wheezing.  Abdominal:     General: Bowel sounds are normal. There is no distension.     Palpations: Abdomen is soft. There is no mass.     Tenderness: There is no abdominal tenderness. There is no guarding or rebound.  Musculoskeletal:        General: No tenderness.     Cervical back: Normal range of motion and neck supple.  Lymphadenopathy:     Cervical: No cervical adenopathy.  Skin:    Findings: No erythema or rash.  Neurological:     Cranial Nerves: No cranial nerve deficit.     Motor: No abnormal muscle tone.     Coordination: Coordination normal.     Deep Tendon Reflexes: Reflexes normal.  Psychiatric:        Behavior: Behavior normal.        Thought Content: Thought content normal.        Judgment: Judgment normal.    Lab Results  Component Value Date   WBC 6.2 11/02/2020   HGB 13.9 11/02/2020   HCT 41.8 11/02/2020   PLT 187 11/02/2020   GLUCOSE 92 11/02/2020   CHOL 217 (H) 05/07/2020   TRIG 131 05/07/2020   HDL 59 05/07/2020   LDLDIRECT 140.0 12/13/2017   LDLCALC 133 (H) 05/07/2020   ALT 16 11/02/2020   AST 23 11/02/2020   NA 145 11/02/2020   K 3.5 11/02/2020   CL 103 11/02/2020   CREATININE 0.81 11/02/2020   BUN 10 11/02/2020   CO2 33 (H) 11/02/2020   TSH 1.61 05/07/2020   INR 1.05 01/26/2016    US Abdomen Limited RUQ (LIVER/GB)  Result Date: 11/02/2020 CLINICAL DATA:  Right upper quadrant pain. EXAM: ULTRASOUND ABDOMEN LIMITED RIGHT UPPER QUADRANT COMPARISON:  May 8th 2019 FINDINGS: Gallbladder: No gallstones or wall thickening visualized. No sonographic Murphy sign noted by sonographer. Common bile duct: Diameter: 3 mm Liver: No focal lesion identified. Within normal  limits in parenchymal echogenicity. Portal vein is patent on color Doppler imaging with normal direction of blood flow towards the liver. Other: None. IMPRESSION: Normal right upper quadrant ultrasound. Electronically Signed   By: Fidela Salisbury M.D.   On: 11/02/2020 12:10    Assessment & Plan:     Walker Kehr, MD

## 2020-11-19 ENCOUNTER — Encounter: Payer: Self-pay | Admitting: Nurse Practitioner

## 2020-11-24 NOTE — Assessment & Plan Note (Signed)
Continue with vitamin B12 supplement

## 2020-11-24 NOTE — Assessment & Plan Note (Signed)
Doing well at present.  No cough

## 2020-12-15 ENCOUNTER — Other Ambulatory Visit: Payer: Self-pay | Admitting: Pulmonary Disease

## 2020-12-17 ENCOUNTER — Encounter: Payer: Self-pay | Admitting: Nurse Practitioner

## 2020-12-17 ENCOUNTER — Ambulatory Visit (INDEPENDENT_AMBULATORY_CARE_PROVIDER_SITE_OTHER): Payer: Medicare Other | Admitting: Nurse Practitioner

## 2020-12-17 VITALS — BP 142/78 | HR 56 | Ht 62.0 in | Wt 108.4 lb

## 2020-12-17 DIAGNOSIS — K219 Gastro-esophageal reflux disease without esophagitis: Secondary | ICD-10-CM | POA: Diagnosis not present

## 2020-12-17 DIAGNOSIS — R1013 Epigastric pain: Secondary | ICD-10-CM

## 2020-12-17 DIAGNOSIS — Z1211 Encounter for screening for malignant neoplasm of colon: Secondary | ICD-10-CM

## 2020-12-17 DIAGNOSIS — R110 Nausea: Secondary | ICD-10-CM

## 2020-12-17 MED ORDER — PLENVU 140 G PO SOLR: ORAL | 0 refills | Status: DC

## 2020-12-17 NOTE — Progress Notes (Signed)
12/17/2020 Lindsay Schultz [id number] 03-16-1951   CHIEF COMPLAINT: Nausea and abdominal pain  HISTORY OF PRESENT ILLNESS: Lindsay Schultz. Lindsay Schultz is a 70 year old female with a past medical history of hypertension, osteopenia, breast cancer 2008 s/p left breast lumpectomy s/p radiation and Tamoxifen x 5 years, bronchiectasis with Mycobacterium infection 2019 which required extensive antibiotics x 18 months followed by pulmonology and ID, asthmatic bronchitis and GERD.  Past total hysterectomy and appendectomy.  She presents her office today as referred by Dr. Alain Marion for further evaluation regarding upper abdominal pain.  She developed epigastric pain for about 1 week related July 2022 which progressively worsened with nausea and she developed diarrhea.  She went to the urgent care clinic for further evaluation and she was sent to [facility] ED 11/02/2020 for further evaluation.  Her labs were normal and a RUQ sonogram was negative, no evidence of gallstones.  She was advised to follow-up with her PCP.    She continues to have nausea.  No vomiting.  She describes having persistent epigastric discomfort which is not severe and is described as a pressure but intermittently feels a burning fire like stomach pain which is relieved by taking Tums or Alka-Seltzer.  No specific food triggers.  No NSAID use.  She has a remote history of GERD.  She underwent an EGD in 2003 which was normal.  She reported starting Protonix in 2019 following her hospital admission with Mycobacterium infection.  She denies having any heartburn or dysphagia.  No further diarrhea since she presented to the ED.  She is passing 2-3 bowel movements daily.  The first 2 bowel movements are typically formed and the third bowel movement is soft to loose.  No rectal bleeding or black stools.  She underwent a colonoscopy by Dr. Fuller Plan 01/24/2004 which was normal.  She is followed by pulmonologist Dr. Marshell Garfinkel due to her history of  Mycobacterium infection and asthmatic bronchiectasis in2019.  She had a mild flare of bronchiectasis 04/2020 which required Prednisone for 5 days without recurrence.  She completed up pulmonary rehab around 04/2020 as well. PFTs did not show any obstruction and very mild restriction and reduction in diffusion capacity as noted in Dr. Matilde Bash  office visit note 11/12/2020. 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 Vaughan Browner assessed these changes were mild and she was clinically stable.  He discussed the CT results with Dr. Linus Salmons with ID with plans to continue to observe and if she needs to go back on therapy would consider inhaled versus IV Amikacin.   CBC Latest Ref Rng & Units 11/02/2020 05/07/2020 02/05/2019  WBC 4.0 - 10.5 K/uL 6.2 5.6 4.8  Hemoglobin 12.0 - 15.0 g/dL 13.9 12.8 12.9  Hematocrit 36.0 - 46.0 % 41.8 37.7 38.1  Platelets 150 - 400 K/uL 187 174 166     CMP Latest Ref Rng & Units 11/02/2020 05/07/2020 10/01/2019  Glucose 70 - 99 mg/dL 92 82 -  BUN 8 - 23 mg/dL 10 12 -  Creatinine 0.44 - 1.00 mg/dL 0.81 0.66 -  Sodium 135 - 145 mmol/L 145 142 -  Potassium 3.5 - 5.1 mmol/L 3.5 3.8 -  Chloride 98 - 111 mmol/L 103 104 -  CO2 22 - 32 mmol/L 33(H) 32 -  Calcium 8.9 - 10.3 mg/dL 9.7 9.7 -  Total Protein 6.5 - 8.1 g/dL 7.6 6.6 6.2  Total Bilirubin 0.3 - 1.2 mg/dL 0.8 0.4  0.3  Alkaline Phos 38 - 126 U/L 98 - -  AST 15 - 41 U/L '23 21 18  '$ ALT 0 - 44 U/L '16 12 13     '$ Past Medical History:  Diagnosis Date   Allergic rhinitis    Benign paroxysmal positional vertigo 06/14/2015   Breast cancer ([place]) 2008   Bronchiectasis ([place])    GERD (gastroesophageal reflux disease)    HTN (hypertension)    Hx of breast cancer    Osteopenia    Personal history of radiation therapy 2008   Past Surgical History:  Procedure Laterality Date   ABDOMINAL HYSTERECTOMY     APPENDECTOMY     BREAST LUMPECTOMY  2008   Left   NASAL  SINUS SURGERY  1997   VIDEO BRONCHOSCOPY Bilateral 02/21/2018   Procedure: VIDEO BRONCHOSCOPY WITHOUT FLUORO;  Surgeon: Marshell Garfinkel, MD;  Location: [place];  Service: Cardiopulmonary;  Laterality: Bilateral;   Social History: She is married.  She has 1 son and 1 daughter.  She is retired.  Non-smoker.  No alcohol use.  No drug use.  Family History: Mother with Sjogren's father with rheumatoid arthritis, emphysema and heart disease.  Brother died Hodgkin's lymphoma, initially diagnosed age 88. No know family history of esophageal, gastric or colon cancer.  Husband survived stage IV colon cancer.   Allergies  Allergen Reactions   Meloxicam Nausea And Vomiting    Upset stomach   Paroxetine Other (See Comments)    Passed out   Penicillins Other (See Comments)    passed out after a shot at 70 y.o  Has patient had a PCN reaction causing immediate rash, facial/tongue/throat swelling, SOB or lightheadedness with hypotension: no Has patient had a PCN reaction causing severe rash involving mucus membranes or skin necrosis:  no Has patient had a PCN reaction that required hospitalization: no Has patient had a PCN reaction occurring within the last 10 years: no If all of the above answers are "NO", then may proceed with Cephalosporin use.    Zanaflex [Tizanidine Hcl]     rash   Ciprofloxacin Rash     rash along the vein w/IV Cipro   Clarithromycin Rash   Zithromax [Azithromycin] Rash      Outpatient Encounter Medications as of 12/17/2020  Medication Sig   acetaminophen (TYLENOL) 500 MG tablet Take 500-1,000 mg by mouth every 6 (six) hours as needed (for pain.).   albuterol (VENTOLIN HFA) 108 (90 Base) MCG/ACT inhaler Inhale 2 puffs into the lungs every 6 (six) hours as needed for wheezing or shortness of breath.   amLODipine (NORVASC) 2.5 MG tablet Take 1 tablet (2.5 mg total) by mouth daily.   butalbital-acetaminophen-caffeine (FIORICET) 50-325-40 MG tablet Take 1 tablet by mouth  every 6 (six) hours as needed for headache.   calcitonin, salmon, (MIACALCIN/FORTICAL) 200 UNIT/ACT nasal spray Place 1 spray into alternate nostrils daily.   Carboxymethylcellulose Sodium 0.25 % SOLN Place 1 drop into both eyes 3 (three) times daily as needed (for dry/irritated eyes.).   cholecalciferol (VITAMIN D) 1000 units tablet Take 1,000 Units by mouth 2 (two) times daily.   CRANBERRY PO Take 1 capsule by mouth every evening.   Cyanocobalamin (VITAMIN B-12) 500 MCG SUBL 1 tab sl qd (Patient taking differently: Take 500 mcg by mouth 2 (two) times daily.)   famotidine (PEPCID) 20 MG tablet Take 20 mg by mouth daily.   fluticasone (FLONASE) 50 MCG/ACT nasal spray Place 2 sprays into both nostrils daily as needed. (Patient taking differently:  Place 2 sprays into both nostrils daily as needed for allergies.)   HYDROcodone-acetaminophen (NORCO) 5-325 MG tablet Take 1 tablet by mouth every 6 (six) hours as needed for severe pain.   nadolol (CORGARD) 40 MG tablet Take 0.5 tablets (20 mg total) by mouth 2 (two) times daily.   pantoprazole (PROTONIX) 40 MG tablet Take 1 tablet (40 mg total) by mouth daily.   sodium chloride HYPERTONIC (NEBUSAL) 3 % nebulizer solution USE 1 VIAL IN THE NEBULIZER IN THE MORNING AND USE 1 VIAL IN THE NEBULIZER AT BEDTIME   TART CHERRY PO Take 1 tablet by mouth daily.   [DISCONTINUED] PEG-KCl-NaCl-NaSulf-Na Asc-C (PLENVU) 140 g SOLR Use as directed for colonoscopy prep.   PEG-KCl-NaCl-NaSulf-Na Asc-C (PLENVU) 140 g SOLR Use as directed for colonoscopy prep.   [DISCONTINUED] fluticasone (FLONASE) 50 MCG/ACT nasal spray Place 2 sprays into the nose daily.   [DISCONTINUED] Ginseng (GIN-ZING) 100 MG CAPS Take 100 mg by mouth daily.   No facility-administered encounter medications on file as of 12/17/2020.    REVIEW OF SYSTEMS: All other systems reviewed and negative except where noted in the History of Present Illness.   PHYSICAL EXAM: BP (!) 142/78   Pulse (!) 56    Ht '5\' 2"'$  (1.575 m)   Wt 108 lb 6 oz (49.2 kg)   BMI 19.82 kg/m  General: 70 year old female in no acute distress. Head: Normocephalic and atraumatic. Eyes:  Sclerae non-icteric, conjunctive pink. Ears: Normal auditory acuity. Mouth: Dentition intact. No ulcers or lesions.  Neck: Supple, no lymphadenopathy or thyromegaly.  Lungs: Clear bilaterally to auscultation without wheezes, crackles or rhonchi. Heart: Regular rate and rhythm. No murmur, rub or gallop appreciated.  Abdomen: Soft, nontender, non distended. No masses. No hepatosplenomegaly. Normoactive bowel sounds x 4 quadrants.  Rectal: Deferred.  Musculoskeletal: Symmetrical with no gross deformities. Skin: Warm and dry. No rash or lesions on visible extremities. Extremities: No edema. Neurological: Alert oriented x 4, no focal deficits.  Psychological:  Alert and cooperative. Normal mood and affect.  ASSESSMENT AND PLAN:  14) 70 year old female with nausea and epigastric pain.  Normal RUQ sonogram and LFTs. -EGD benefits and risks discussed including risk with sedation, risk of bleeding, perforation and infection  -Continue Pantoprazole 40 mg every morning and add Famotidine 20 mg nightly  2) Colon cancer screening.  Normal colonoscopy in 2005. -Colonoscopy benefits and risks discussed including risk with sedation, risk of bleeding, perforation and infection   3) Personal history of breast cancer 2008  4) History of Mycobacterium pulmonary infection/bronchiectasis treated with triple antibiotic therapy X 18 months 2019.  See further details in HPI above.     CC:  Plotnikov, Evie Lacks, MD

## 2020-12-17 NOTE — Progress Notes (Signed)
Reviewed and agree with management plan.  Lene Mckay T. Shavette Shoaff, MD FACG 

## 2020-12-17 NOTE — Patient Instructions (Addendum)
PROCEDURES: You have been scheduled for an EGD and Colonoscopy. Please follow the written instructions given to you at your visit today. Please pick up your prep supplies at the pharmacy within the next 1-3 days. If you use inhalers (even only as needed), please bring them with you on the day of your procedure.  RECOMMENDATIONS: Continue Pantoprazole 40 MG once a day. Add over the counter Famotidine 20 MG at bedtime.  It was great seeing you today! Thank you for entrusting me with your care and choosing [facility].  Noralyn Pick, CRNP  The [place] [facility] providers would like to encourage you to use [facility] to communicate with providers for non-urgent requests or questions.  Due to long hold times on the telephone, sending your provider a message by [facility] may be faster and more efficient [place] to get a response. Please allow 48 business hours for a response.  Please remember that this is for non-urgent requests/questions.  If you are age 69 or older, your body mass index should be between 23-30. Your Body mass index is 19.82 kg/m. If this is out of the aforementioned range listed, please consider follow up with your Primary Care Provider.  If you are age 74 or younger, your body mass index should be between 19-25. Your Body mass index is 19.82 kg/m. If this is out of the aformentioned range listed, please consider follow up with your Primary Care Provider.

## 2021-01-07 ENCOUNTER — Other Ambulatory Visit: Payer: Self-pay

## 2021-01-07 ENCOUNTER — Ambulatory Visit
Admission: RE | Admit: 2021-01-07 | Discharge: 2021-01-07 | Disposition: A | Discharge status: Home / Self Care | Payer: Medicare Other | Source: Ambulatory Visit | Attending: Internal Medicine | Admitting: Internal Medicine

## 2021-01-07 DIAGNOSIS — Z1231 Encounter for screening mammogram for malignant neoplasm of breast: Secondary | ICD-10-CM

## 2021-01-12 ENCOUNTER — Other Ambulatory Visit: Payer: Self-pay

## 2021-01-12 ENCOUNTER — Other Ambulatory Visit: Payer: Self-pay | Admitting: *Deleted

## 2021-01-12 ENCOUNTER — Encounter: Payer: Self-pay | Admitting: Gastroenterology

## 2021-01-12 ENCOUNTER — Other Ambulatory Visit (INDEPENDENT_AMBULATORY_CARE_PROVIDER_SITE_OTHER): Payer: Medicare Other

## 2021-01-12 ENCOUNTER — Ambulatory Visit (AMBULATORY_SURGERY_CENTER): Payer: Medicare Other | Admitting: Gastroenterology

## 2021-01-12 VITALS — BP 129/84 | HR 76 | Temp 96.6°F | Resp 20 | Ht 62.0 in | Wt 108.0 lb

## 2021-01-12 DIAGNOSIS — R1013 Epigastric pain: Secondary | ICD-10-CM

## 2021-01-12 DIAGNOSIS — K3189 Other diseases of stomach and duodenum: Secondary | ICD-10-CM | POA: Diagnosis not present

## 2021-01-12 DIAGNOSIS — K319 Disease of stomach and duodenum, unspecified: Secondary | ICD-10-CM | POA: Diagnosis not present

## 2021-01-12 DIAGNOSIS — K317 Polyp of stomach and duodenum: Secondary | ICD-10-CM

## 2021-01-12 DIAGNOSIS — Z1211 Encounter for screening for malignant neoplasm of colon: Secondary | ICD-10-CM

## 2021-01-12 DIAGNOSIS — R110 Nausea: Secondary | ICD-10-CM

## 2021-01-12 LAB — CREATININE, SERUM: Creatinine, Ser: 0.79 mg/dL (ref 0.40–1.20)

## 2021-01-12 LAB — BUN: BUN: 9 mg/dL (ref 6–23)

## 2021-01-12 MED ORDER — SODIUM CHLORIDE 0.9 % IV SOLN
500.0000 mL | Freq: Once | INTRAVENOUS | Status: DC
Start: 2021-01-12 — End: 2021-01-12

## 2021-01-12 MED ORDER — PANTOPRAZOLE SODIUM 40 MG PO TBEC: 40.0000 mg | DELAYED_RELEASE_TABLET | Freq: Two times a day (BID) | ORAL | 7 refills | Status: DC

## 2021-01-12 NOTE — Patient Instructions (Signed)
Read all handouts provided to you today Await pathology results  CT scan to be scheduled   YOU HAD AN ENDOSCOPIC PROCEDURE TODAY AT [facility]:   Refer to the procedure report that was given to you for any specific questions about what was found during the examination.  If the procedure report does not answer your questions, please call your gastroenterologist to clarify.  If you requested that your care partner not be given the details of your procedure findings, then the procedure report has been included in a sealed envelope for you to review at your convenience later.  YOU SHOULD EXPECT: Some feelings of bloating in the abdomen. Passage of more gas than usual.  Walking can help get rid of the air that was put into your GI tract during the procedure and reduce the bloating. If you had a lower endoscopy (such as a colonoscopy or flexible sigmoidoscopy) you may notice spotting of blood in your stool or on the toilet paper. If you underwent a bowel prep for your procedure, you may not have a normal bowel movement for a few days.  Please Note:  You might notice some irritation and congestion in your nose or some drainage.  This is from the oxygen used during your procedure.  There is no need for concern and it should clear up in a day or so.  SYMPTOMS TO REPORT IMMEDIATELY:  Following lower endoscopy (colonoscopy or flexible sigmoidoscopy):  Excessive amounts of blood in the stool  Significant tenderness or worsening of abdominal pains  Swelling of the abdomen that is new, acute  Fever of 100F or higher  Following upper endoscopy (EGD)  Vomiting of blood or coffee ground material  New chest pain or pain under the shoulder blades  Painful or persistently difficult swallowing  New shortness of breath  Fever of 100F or higher  Black, tarry-looking stools  For urgent or emergent issues, a gastroenterologist can be reached at any hour by calling [phone]. Do not use  MyChart messaging for urgent concerns.    DIET:  We do recommend a small meal at first, but then you may proceed to your regular diet.  Drink plenty of fluids but you should avoid alcoholic beverages for 24 hours.  ACTIVITY:  You should plan to take it easy for the rest of today and you should NOT DRIVE or use heavy machinery until tomorrow (because of the sedation medicines used during the test).    FOLLOW UP: Our staff will call the number listed on your records 48-72 hours following your procedure to check on you and address any questions or concerns that you may have regarding the information given to you following your procedure. If we do not reach you, we will leave a message.  We will attempt to reach you two times.  During this call, we will ask if you have developed any symptoms of COVID 19. If you develop any symptoms (ie: fever, flu-like symptoms, shortness of breath, cough etc.) before then, please call [phone].  If you test positive for Covid 19 in the 2 weeks post procedure, please call and report this information to [place].    If any biopsies were taken you will be contacted by phone or by letter within the next 1-3 weeks.  Please call us at [phone] if you have not heard about the biopsies in 3 weeks.    SIGNATURES/CONFIDENTIALITY: You and/or your care partner have signed paperwork which will be entered into your  electronic medical record.  These signatures attest to the fact that that the information above on your After Visit Summary has been reviewed and is understood.  Full responsibility of the confidentiality of this discharge information lies with you and/or your care-partner.

## 2021-01-12 NOTE — Progress Notes (Signed)
History & Physical  Primary Care Physician:  Cassandria Anger, MD Primary Gastroenterologist: Jerilynn Mages. Fuller Plan, MD  CHIEF COMPLAINT:  CRC screening, epigastric pain and nausea  HPI: Lindsay Schultz is a 70 y.o. female with epigastric pain and nausea not controlled with pantoprazole and famotidine for EGD.  CRC screening, average risk colonoscopy.  See 12/17/2020 office note for more information.   Past Medical History:  Diagnosis Date   Allergic rhinitis    Benign paroxysmal positional vertigo 06/14/2015   Breast cancer ([place]) 2008   Bronchiectasis ([place])    GERD (gastroesophageal reflux disease)    HTN (hypertension)    Hx of breast cancer    Osteopenia    Personal history of radiation therapy 2008    Past Surgical History:  Procedure Laterality Date   ABDOMINAL HYSTERECTOMY     APPENDECTOMY     BREAST LUMPECTOMY  2008   Left   NASAL SINUS SURGERY  1997   VIDEO BRONCHOSCOPY Bilateral 02/21/2018   Procedure: VIDEO BRONCHOSCOPY WITHOUT FLUORO;  Surgeon: Marshell Garfinkel, MD;  Location: [facility];  Service: Cardiopulmonary;  Laterality: Bilateral;    Prior to Admission medications   Medication Sig Start Date End Date Taking? Authorizing Provider  acetaminophen (TYLENOL) 500 MG tablet Take 500-1,000 mg by mouth every 6 (six) hours as needed (for pain.).   Yes [provider]  amLODipine (NORVASC) 2.5 MG tablet Take 1 tablet (2.5 mg total) by mouth daily. 06/16/20  Yes Plotnikov, Evie Lacks, MD  calcitonin, salmon, (MIACALCIN/FORTICAL) 200 UNIT/ACT nasal spray Place 1 spray into alternate nostrils daily. 10/21/20  Yes Plotnikov, Evie Lacks, MD  Carboxymethylcellulose Sodium 0.25 % SOLN Place 1 drop into both eyes 3 (three) times daily as needed (for dry/irritated eyes.).   Yes [provider]  cholecalciferol (VITAMIN D) 1000 units tablet Take 1,000 Units by mouth 2 (two) times daily.   Yes [provider]  CRANBERRY PO Take 1 capsule by mouth every evening.    Yes [provider]  Cyanocobalamin (VITAMIN B-12) 500 MCG SUBL 1 tab sl qd Patient taking differently: Take 500 mcg by mouth 2 (two) times daily. 08/23/12  Yes Plotnikov, Evie Lacks, MD  famotidine (PEPCID) 20 MG tablet Take 20 mg by mouth daily.   Yes [provider]  fluticasone (FLONASE) 50 MCG/ACT nasal spray Place 2 sprays into both nostrils daily as needed. Patient taking differently: Place 2 sprays into both nostrils daily as needed for allergies. 10/21/20  Yes Plotnikov, Evie Lacks, MD  nadolol (CORGARD) 40 MG tablet Take 0.5 tablets (20 mg total) by mouth 2 (two) times daily. 10/15/20  Yes Plotnikov, Evie Lacks, MD  pantoprazole (PROTONIX) 40 MG tablet Take 1 tablet (40 mg total) by mouth daily. 10/15/20  Yes Plotnikov, Evie Lacks, MD  albuterol (VENTOLIN HFA) 108 (90 Base) MCG/ACT inhaler Inhale 2 puffs into the lungs every 6 (six) hours as needed for wheezing or shortness of breath. 08/14/19   Mannam, Praveen, MD  butalbital-acetaminophen-caffeine (FIORICET) 50-325-40 MG tablet Take 1 tablet by mouth every 6 (six) hours as needed for headache. Patient not taking: Reported on 01/12/2021 06/06/20   Plotnikov, Evie Lacks, MD  HYDROcodone-acetaminophen (NORCO) 5-325 MG tablet Take 1 tablet by mouth every 6 (six) hours as needed for severe pain. Patient not taking: Reported on 01/12/2021 11/12/20   Plotnikov, Evie Lacks, MD  sodium chloride HYPERTONIC (NEBUSAL) 3 % nebulizer solution USE 1 VIAL IN THE NEBULIZER IN THE MORNING AND USE 1 VIAL IN THE NEBULIZER  AT BEDTIME Patient not taking: Reported on 01/12/2021 12/15/20   Marshell Garfinkel, MD  TART CHERRY PO Take 1 tablet by mouth daily. Patient not taking: Reported on 01/12/2021    [provider]    Current Outpatient Medications  Medication Sig Dispense Refill   acetaminophen (TYLENOL) 500 MG tablet Take 500-1,000 mg by mouth every 6 (six) hours as needed (for pain.).     amLODipine (NORVASC) 2.5 MG tablet Take 1 tablet (2.5 mg  total) by mouth daily. 90 tablet 3   calcitonin, salmon, (MIACALCIN/FORTICAL) 200 UNIT/ACT nasal spray Place 1 spray into alternate nostrils daily. 3.7 mL 5   Carboxymethylcellulose Sodium 0.25 % SOLN Place 1 drop into both eyes 3 (three) times daily as needed (for dry/irritated eyes.).     cholecalciferol (VITAMIN D) 1000 units tablet Take 1,000 Units by mouth 2 (two) times daily.     CRANBERRY PO Take 1 capsule by mouth every evening.     Cyanocobalamin (VITAMIN B-12) 500 MCG SUBL 1 tab sl qd (Patient taking differently: Take 500 mcg by mouth 2 (two) times daily.) 100 tablet 11   famotidine (PEPCID) 20 MG tablet Take 20 mg by mouth daily.     fluticasone (FLONASE) 50 MCG/ACT nasal spray Place 2 sprays into both nostrils daily as needed. (Patient taking differently: Place 2 sprays into both nostrils daily as needed for allergies.) 16 g 5   nadolol (CORGARD) 40 MG tablet Take 0.5 tablets (20 mg total) by mouth 2 (two) times daily. 30 tablet 11   pantoprazole (PROTONIX) 40 MG tablet Take 1 tablet (40 mg total) by mouth daily. 30 tablet 11   albuterol (VENTOLIN HFA) 108 (90 Base) MCG/ACT inhaler Inhale 2 puffs into the lungs every 6 (six) hours as needed for wheezing or shortness of breath. 18 g 11   butalbital-acetaminophen-caffeine (FIORICET) 50-325-40 MG tablet Take 1 tablet by mouth every 6 (six) hours as needed for headache. (Patient not taking: Reported on 01/12/2021) 60 tablet 1   HYDROcodone-acetaminophen (NORCO) 5-325 MG tablet Take 1 tablet by mouth every 6 (six) hours as needed for severe pain. (Patient not taking: Reported on 01/12/2021) 20 tablet 0   sodium chloride HYPERTONIC (NEBUSAL) 3 % nebulizer solution USE 1 VIAL IN THE NEBULIZER IN THE MORNING AND USE 1 VIAL IN THE NEBULIZER AT BEDTIME (Patient not taking: Reported on 01/12/2021) 240 mL 11   TART CHERRY PO Take 1 tablet by mouth daily. (Patient not taking: Reported on 01/12/2021)     Current Facility-Administered Medications   Medication Dose Route Frequency Provider Last Rate Last Admin   0.9 %  sodium chloride infusion  500 mL Intravenous Once Ladene Artist, MD        Allergies as of 01/12/2021 - Review Complete 01/12/2021  Allergen Reaction Noted   Meloxicam Nausea And Vomiting 09/26/2012   Paroxetine Other (See Comments) 11/27/2013   Penicillins Other (See Comments) 06/12/2007   Zanaflex [tizanidine hcl]  09/05/2018   Ciprofloxacin Rash 06/12/2007   Clarithromycin Rash 06/12/2007   Zithromax [azithromycin] Rash 10/30/2014    Family History  Problem Relation Age of Onset   Sjogren's syndrome Mother    Arthritis Father 70       RA   Emphysema Father    Heart disease Father    Hypertension Other    Hodgkin's lymphoma Brother     Social History   Socioeconomic History   Marital status: Married    Spouse name: Not on file   Number of  children: Not on file   Years of education: Not on file   Highest education level: Not on file  Occupational History   Occupation: Retired    Comment: Orthoptist  Tobacco Use   Smoking status: Never   Smokeless tobacco: Never  Vaping Use   Vaping Use: Never used  Substance and Sexual Activity   Alcohol use: No    Alcohol/week: 0.0 standard drinks   Drug use: No   Sexual activity: Yes  Other Topics Concern   Not on file  Social History Narrative   Regular Exercise- yes   Social Determinants of Health   Financial Resource Strain: Low Risk    Difficulty of Paying Living Expenses: Not hard at all  Food Insecurity: No Food Insecurity   Worried About Charity fundraiser in the Last Year: Never true   [place] in the Last Year: Never true  Transportation Needs: No Transportation Needs   Lack of Transportation (Medical): No   Lack of Transportation (Non-Medical): No  Physical Activity: Sufficiently Active   Days of Exercise per Week: 5 days   Minutes of Exercise per Session: 30 min  Stress: No Stress Concern Present   Feeling of Stress  : Not at all  Social Connections: Socially Integrated   Frequency of Communication with Friends and Family: More than three times a week   Frequency of Social Gatherings with Friends and Family: Once a week   Attends Religious Services: More than 4 times per year   Active Member of [employer] or Organizations: Yes   Attends Archivist Meetings: 1 to 4 times per year   Marital Status: Married  Human resources officer Violence: Not on file    Review of Systems:  All systems reviewed an negative except where noted in HPI.  Gen: Denies any fever, chills, sweats, anorexia, fatigue, weakness, malaise, weight loss, and sleep disorder CV: Denies chest pain, angina, palpitations, syncope, orthopnea, PND, peripheral edema, and claudication. Resp: Denies dyspnea at rest, dyspnea with exercise, cough, sputum, wheezing, coughing up blood, and pleurisy. GI: Denies vomiting blood, jaundice, and fecal incontinence.   Denies dysphagia or odynophagia. GU : Denies urinary burning, blood in urine, urinary frequency, urinary hesitancy, nocturnal urination, and urinary incontinence. MS: Denies joint pain, limitation of movement, and swelling, stiffness, low back pain, extremity pain. Denies muscle weakness, cramps, atrophy.  Derm: Denies rash, itching, dry skin, hives, moles, warts, or unhealing ulcers.  Psych: Denies depression, anxiety, memory loss, suicidal ideation, hallucinations, paranoia, and confusion. Heme: Denies bruising, bleeding, and enlarged lymph nodes. Neuro:  Denies any headaches, dizziness, paresthesias. Endo:  Denies any problems with DM, thyroid, adrenal function.   Physical Exam: General:  Alert, well-developed, in NAD Head:  Normocephalic and atraumatic. Eyes:  Sclera clear, no icterus.   Conjunctiva pink. Ears:  Normal auditory acuity. Mouth:  No deformity or lesions.  Neck:  Supple; no masses . Lungs:  Clear throughout to auscultation.   No wheezes, crackles, or rhonchi. No acute  distress. Heart:  Regular rate and rhythm; no murmurs. Abdomen:  Soft, nondistended, nontender. No masses, hepatomegaly. No obvious masses.  Normal bowel .    Rectal:  Deferred   Msk:  Symmetrical without gross deformities.. Pulses:  Normal pulses noted. Extremities:  Without edema. Neurologic:  Alert and  oriented x4;  grossly normal neurologically. Skin:  Intact without significant lesions or rashes. Cervical Nodes:  No significant cervical adenopathy. Psych:  Alert and cooperative. Normal mood and affect.  Impression / Plan:   Epigastric pain, nausea and CRC screening, average risk, for colonoscopy and EGD.   This patient is appropriate for endoscopic procedures in the ambulatory setting.    Pricilla Riffle. Fuller Plan  01/12/2021, 1:37 PM

## 2021-01-12 NOTE — Op Note (Signed)
[facility] Patient Name: Lindsay Schultz Procedure Date: 01/12/2021 1:05 PM MRN: [id number] Endoscopist: Ladene Artist , MD Age: 70 Referring MD:  Date of Birth: Aug 28, 1950 Gender: Female Account #: [id number] Procedure:                Upper GI endoscopy Indications:              Epigastric abdominal pain, Nausea Medicines:                Monitored Anesthesia Care Procedure:                Pre-Anesthesia Assessment:                           - Prior to the procedure, a History and Physical                            was performed, and patient medications and                            allergies were reviewed. The patient's tolerance of                            previous anesthesia was also reviewed. The risks                            and benefits of the procedure and the sedation                            options and risks were discussed with the patient.                            All questions were answered, and informed consent                            was obtained. Prior Anticoagulants: The patient has                            taken no previous anticoagulant or antiplatelet                            agents. ASA Grade Assessment: II - A patient with                            mild systemic disease. After reviewing the risks                            and benefits, the patient was deemed in                            satisfactory condition to undergo the procedure.                           - Prior to the procedure, a History and Physical  was performed, and patient medications and                            allergies were reviewed. The patient's tolerance of                            previous anesthesia was also reviewed. The risks                            and benefits of the procedure and the sedation                            options and risks were discussed with the patient.                            All questions were answered, and  informed consent                            was obtained. Prior Anticoagulants: The patient has                            taken no previous anticoagulant or antiplatelet                            agents. ASA Grade Assessment: II - A patient with                            mild systemic disease. After reviewing the risks                            and benefits, the patient was deemed in                            satisfactory condition to undergo the procedure.                           After obtaining informed consent, the endoscope was                            passed under direct vision. Throughout the                            procedure, the patient's blood pressure, pulse, and                            oxygen saturations were monitored continuously. The                            GIF [id number] was introduced through the                            mouth, and advanced to the second part of duodenum.  The upper GI endoscopy was accomplished without                            difficulty. The patient tolerated the procedure                            well. Scope In: Scope Out: Findings:                 The examined esophagus was normal.                           A few 3 to 5 mm sessile polyps with no bleeding and                            no stigmata of recent bleeding were found in the                            gastric body. Biopsies were taken with a cold                            forceps for histology.                           Patchy mildly erythematous mucosa without bleeding                            was found in the gastric body and in the gastric                            antrum. Biopsies were taken with a cold forceps for                            histology.                           The exam of the stomach was otherwise normal.                           The duodenal bulb and second portion of the                            duodenum were  normal. Complications:            No immediate complications. Estimated Blood Loss:     Estimated blood loss was minimal. Impression:               - Normal esophagus.                           - A few gastric polyps. Biopsied.                           - Erythematous mucosa in the gastric body and  antrum. Biopsied.                           - Normal duodenal bulb and second portion of the                            duodenum. Recommendation:           - Patient has a contact number available for                            emergencies. The signs and symptoms of potential                            delayed complications were discussed with the                            patient. Return to normal activities tomorrow.                            Written discharge instructions were provided to the                            patient.                           - Resume previous diet.                           - Continue present medications.                           - Change pantoprazole 40 mg po bid, 1 year of                            refills.                           - Await pathology results.                           - Schedule CT AP with contrast, next available and                            before return office appt.                           - Return to GI office in 1 month with me or CKS. Ladene Artist, MD 01/12/2021 2:21:40 PM This report has been signed electronically.

## 2021-01-12 NOTE — Progress Notes (Signed)
1335 Robinul 0.1 mg IV given due large amount of secretions upon assessment.  MD made aware, vss 

## 2021-01-12 NOTE — Progress Notes (Signed)
Report given to PACU, vss 

## 2021-01-12 NOTE — Op Note (Signed)
[facility] Patient Name: Lindsay Schultz Procedure Date: 01/12/2021 1:01 PM MRN: [id number] Endoscopist: Ladene Artist , MD Age: 70 Referring MD:  Date of Birth: April 26, 1950 Gender: Female Account #: [id number] Procedure:                Colonoscopy Indications:              Screening for colorectal malignant neoplasm Medicines:                Monitored Anesthesia Care Procedure:                Pre-Anesthesia Assessment:                           - Prior to the procedure, a History and Physical                            was performed, and patient medications and                            allergies were reviewed. The patient's tolerance of                            previous anesthesia was also reviewed. The risks                            and benefits of the procedure and the sedation                            options and risks were discussed with the patient.                            All questions were answered, and informed consent                            was obtained. Prior Anticoagulants: The patient has                            taken no previous anticoagulant or antiplatelet                            agents. ASA Grade Assessment: II - A patient with                            mild systemic disease. After reviewing the risks                            and benefits, the patient was deemed in                            satisfactory condition to undergo the procedure.                           After obtaining informed consent, the colonoscope  was passed under direct vision. Throughout the                            procedure, the patient's blood pressure, pulse, and                            oxygen saturations were monitored continuously. The                            Olympus [id number]-[id number] (#[id number]) Colonoscope was                            introduced through the anus and advanced to the the                            cecum, identified by  appendiceal orifice and                            ileocecal valve. The ileocecal valve, appendiceal                            orifice, and rectum were photographed. The quality                            of the bowel preparation was good. The colonoscopy                            was performed without difficulty. The patient                            tolerated the procedure well. Scope In: 1:41:51 PM Scope Out: 1:58:47 PM Scope Withdrawal Time: 0 hours 13 minutes 55 seconds  Total Procedure Duration: 0 hours 16 minutes 56 seconds  Findings:                 The perianal and digital rectal examinations were                            normal.                           A few medium-mouthed diverticula were found in the                            left colon. There was no evidence of diverticular                            bleeding.                           Internal hemorrhoids were found during                            retroflexion. The hemorrhoids were small and Grade  I (internal hemorrhoids that do not prolapse).                           The exam was otherwise without abnormality on                            direct and retroflexion views. Complications:            No immediate complications. Estimated blood loss:                            None. Estimated Blood Loss:     Estimated blood loss: none. Impression:               - Mild diverticulosis in the left colon.                           - Internal hemorrhoids.                           - The examination was otherwise normal on direct                            and retroflexion views.                           - No specimens collected. Recommendation:           - Consider repeat colonoscopy in 10 years vs no                            repeat due to age for screening purposes.                           - Patient has a contact number available for                            emergencies. The signs and  symptoms of potential                            delayed complications were discussed with the                            patient. Return to normal activities tomorrow.                            Written discharge instructions were provided to the                            patient.                           - High fiber diet.                           - Continue present medications. Ladene Artist, MD 01/12/2021 2:13:38 PM This report has been signed electronically.

## 2021-01-12 NOTE — Progress Notes (Signed)
Pt's states no medical or surgical changes since previsit or office visit. VS assessed by D.T 

## 2021-01-14 ENCOUNTER — Telehealth: Payer: Self-pay

## 2021-01-14 NOTE — Telephone Encounter (Signed)
  Follow up Call-  Call back number 01/12/2021  Post procedure Call Back phone  # [phone]  Permission to leave phone message Yes  Some recent data might be hidden     Patient questions:  Do you have a fever, pain , or abdominal swelling? No. Pain Score  0 *  Pt did report a "scratchy throat, no pain".  I advised her to try warm water gargle, chlorceptic loz, tylenol if needed.  May take a couple day.  If sx do not resolve to give [place] a call back.  Pt said she was eating and drinking without difficulity.    Have you tolerated food without any problems? Yes.    Have you been able to return to your normal activities? Yes.    Do you have any questions about your discharge instructions: Diet   No. Medications  No. Follow up visit  No.  Do you have questions or concerns about your Care? No.  Actions: * If pain score is 4 or above: No action needed, pain <4.    Have you developed a fever since your procedure? no  2.   Have you had an respiratory symptoms (SOB or cough) since your procedure? no  3.   Have you tested positive for COVID 19 since your procedure no  4.   Have you had any family members/close contacts diagnosed with the COVID 19 since your procedure?  no   If yes to any of these questions please route to Joylene John, RN and Joella Prince, RN

## 2021-01-16 ENCOUNTER — Other Ambulatory Visit: Payer: Self-pay

## 2021-01-16 DIAGNOSIS — R109 Unspecified abdominal pain: Secondary | ICD-10-CM

## 2021-01-16 DIAGNOSIS — R110 Nausea: Secondary | ICD-10-CM

## 2021-01-27 ENCOUNTER — Other Ambulatory Visit: Payer: Self-pay

## 2021-01-27 ENCOUNTER — Ambulatory Visit (HOSPITAL_COMMUNITY)
Admission: RE | Admit: 2021-01-27 | Discharge: 2021-01-27 | Disposition: A | Discharge status: Home / Self Care | Payer: Medicare Other | Source: Ambulatory Visit | Attending: Gastroenterology | Admitting: Gastroenterology

## 2021-01-27 DIAGNOSIS — R110 Nausea: Secondary | ICD-10-CM | POA: Diagnosis not present

## 2021-01-27 DIAGNOSIS — R109 Unspecified abdominal pain: Secondary | ICD-10-CM | POA: Insufficient documentation

## 2021-01-27 MED ORDER — IOHEXOL 350 MG/ML SOLN
80.0000 mL | Freq: Once | INTRAVENOUS | Status: AC | PRN
  Administered 2021-01-27: 80 mL via INTRAVENOUS

## 2021-01-28 ENCOUNTER — Encounter: Payer: Self-pay | Admitting: Gastroenterology

## 2021-01-29 ENCOUNTER — Ambulatory Visit: Payer: Medicare Other | Admitting: Internal Medicine

## 2021-02-02 ENCOUNTER — Other Ambulatory Visit: Payer: Self-pay | Admitting: Pulmonary Disease

## 2021-02-16 ENCOUNTER — Encounter: Payer: Self-pay | Admitting: Internal Medicine

## 2021-02-16 ENCOUNTER — Ambulatory Visit (INDEPENDENT_AMBULATORY_CARE_PROVIDER_SITE_OTHER): Payer: Medicare Other | Admitting: Internal Medicine

## 2021-02-16 ENCOUNTER — Other Ambulatory Visit: Payer: Self-pay

## 2021-02-16 VITALS — BP 144/80 | HR 53 | Temp 98.2°F | Ht 62.0 in | Wt 107.2 lb

## 2021-02-16 DIAGNOSIS — J479 Bronchiectasis, uncomplicated: Secondary | ICD-10-CM | POA: Diagnosis not present

## 2021-02-16 DIAGNOSIS — Z0000 Encounter for general adult medical examination without abnormal findings: Secondary | ICD-10-CM | POA: Diagnosis not present

## 2021-02-16 DIAGNOSIS — E538 Deficiency of other specified B group vitamins: Secondary | ICD-10-CM

## 2021-02-16 DIAGNOSIS — E785 Hyperlipidemia, unspecified: Secondary | ICD-10-CM | POA: Diagnosis not present

## 2021-02-16 DIAGNOSIS — R634 Abnormal weight loss: Secondary | ICD-10-CM

## 2021-02-16 DIAGNOSIS — Z23 Encounter for immunization: Secondary | ICD-10-CM | POA: Diagnosis not present

## 2021-02-16 DIAGNOSIS — N760 Acute vaginitis: Secondary | ICD-10-CM | POA: Insufficient documentation

## 2021-02-16 LAB — COMPREHENSIVE METABOLIC PANEL
ALT: 11 U/L (ref 0–35)
AST: 17 U/L (ref 0–37)
Albumin: 4.4 g/dL (ref 3.5–5.2)
Alkaline Phosphatase: 87 U/L (ref 39–117)
BUN: 9 mg/dL (ref 6–23)
CO2: 31 mEq/L (ref 19–32)
Calcium: 9.4 mg/dL (ref 8.4–10.5)
Chloride: 104 mEq/L (ref 96–112)
Creatinine, Ser: 0.6 mg/dL (ref 0.40–1.20)
GFR: 91.32 mL/min (ref >60.00)
Glucose, Bld: 81 mg/dL (ref 70–99)
Potassium: 3.9 mEq/L (ref 3.5–5.1)
Sodium: 143 mEq/L (ref 135–145)
Total Bilirubin: 0.4 mg/dL (ref 0.2–1.2)
Total Protein: 7 g/dL (ref 6.0–8.3)

## 2021-02-16 LAB — CBC WITH DIFFERENTIAL/PLATELET
Basophils Absolute: 0 10*3/uL (ref 0.0–0.1)
Basophils Relative: 0.3 % (ref 0.0–3.0)
Eosinophils Absolute: 0.1 10*3/uL (ref 0.0–0.7)
Eosinophils Relative: 1.8 % (ref 0.0–5.0)
HCT: 38 % (ref 36.0–46.0)
Hemoglobin: 12.5 g/dL (ref 12.0–15.0)
Lymphocytes Relative: 28.2 % (ref 12.0–46.0)
Lymphs Abs: 1.4 10*3/uL (ref 0.7–4.0)
MCHC: 33 g/dL (ref 30.0–36.0)
MCV: 90.9 fl (ref 78.0–100.0)
Monocytes Absolute: 0.4 10*3/uL (ref 0.1–1.0)
Monocytes Relative: 8.9 % (ref 3.0–12.0)
Neutro Abs: 3 10*3/uL (ref 1.4–7.7)
Neutrophils Relative %: 60.8 % (ref 43.0–77.0)
Platelets: 201 10*3/uL (ref 150.0–400.0)
RBC: 4.18 Mil/uL (ref 3.87–5.11)
RDW: 13.4 % (ref 11.5–15.5)
WBC: 5 10*3/uL (ref 4.0–10.5)

## 2021-02-16 LAB — TSH: TSH: 1.08 u[IU]/mL (ref 0.35–5.50)

## 2021-02-16 LAB — URINALYSIS
Bilirubin Urine: NEGATIVE
Hgb urine dipstick: NEGATIVE
Ketones, ur: NEGATIVE
Leukocytes,Ua: NEGATIVE
Nitrite: NEGATIVE
Specific Gravity, Urine: 1.015 (ref 1.000–1.030)
Total Protein, Urine: NEGATIVE
Urine Glucose: NEGATIVE
Urobilinogen, UA: 0.2 (ref 0.0–1.0)
pH: 7.5 (ref 5.0–8.0)

## 2021-02-16 LAB — LIPID PANEL
Cholesterol: 202 mg/dL — ABNORMAL HIGH (ref 0–200)
HDL: 56.5 mg/dL (ref >39.00)
LDL Cholesterol: 119 mg/dL — ABNORMAL HIGH (ref 0–99)
NonHDL: 145.68
Total CHOL/HDL Ratio: 4
Triglycerides: 135 mg/dL (ref 0.0–149.0)
VLDL: 27 mg/dL (ref 0.0–40.0)

## 2021-02-16 MED ORDER — FLUCONAZOLE 150 MG PO TABS: 150.0000 mg | ORAL_TABLET | Freq: Once | ORAL | 1 refills | Status: AC

## 2021-02-16 NOTE — Assessment & Plan Note (Signed)

## 2021-02-16 NOTE — Assessment & Plan Note (Addendum)
New Diflucan was given po

## 2021-02-16 NOTE — Addendum Note (Signed)
Addended by: Boris Lown B on: 02/16/2021 11:52 AM   Modules accepted: Orders

## 2021-02-16 NOTE — Assessment & Plan Note (Signed)
On Vit B12 

## 2021-02-16 NOTE — Assessment & Plan Note (Signed)
Off abx >1 year  Chest CT is due in March 2023

## 2021-02-16 NOTE — Progress Notes (Signed)
Subjective:  Patient ID: Lindsay Schultz, female    DOB: [birth year]  Age: 70 y.o. MRN: [id number]  CC: Annual Exam (Flu shot)   HPI Lindsay Schultz presents for yeast vaginitis F/u HTN, colon polyps, GERD Well exam    Outpatient Medications Prior to Visit  Medication Sig Dispense Refill   acetaminophen (TYLENOL) 500 MG tablet Take 500-1,000 mg by mouth every 6 (six) hours as needed (for pain.).     albuterol (VENTOLIN HFA) 108 (90 Base) MCG/ACT inhaler inhale 2 puffs EVERY 6 HOURS AS NEEDED wheezing & SHORTNESS OF BREATH 18 g 11   amLODipine (NORVASC) 2.5 MG tablet Take 1 tablet (2.5 mg total) by mouth daily. 90 tablet 3   butalbital-acetaminophen-caffeine (FIORICET) 50-325-40 MG tablet Take 1 tablet by mouth every 6 (six) hours as needed for headache. 60 tablet 1   calcitonin, salmon, (MIACALCIN/FORTICAL) 200 UNIT/ACT nasal spray Place 1 spray into alternate nostrils daily. 3.7 mL 5   Carboxymethylcellulose Sodium 0.25 % SOLN Place 1 drop into both eyes 3 (three) times daily as needed (for dry/irritated eyes.).     cholecalciferol (VITAMIN D) 1000 units tablet Take 1,000 Units by mouth 2 (two) times daily.     CRANBERRY PO Take 1 capsule by mouth every evening.     Cyanocobalamin (VITAMIN B-12) 500 MCG SUBL 1 tab sl qd (Patient taking differently: Take 500 mcg by mouth 2 (two) times daily.) 100 tablet 11   famotidine (PEPCID) 20 MG tablet Take 20 mg by mouth daily.     fluticasone (FLONASE) 50 MCG/ACT nasal spray Place 2 sprays into both nostrils daily as needed. (Patient taking differently: Place 2 sprays into both nostrils daily as needed for allergies.) 16 g 5   HYDROcodone-acetaminophen (NORCO) 5-325 MG tablet Take 1 tablet by mouth every 6 (six) hours as needed for severe pain. 20 tablet 0   nadolol (CORGARD) 40 MG tablet Take 0.5 tablets (20 mg total) by mouth 2 (two) times daily. 30 tablet 11   pantoprazole (PROTONIX) 40 MG tablet Take 1 tablet (40 mg total) by mouth 2 (two)  times daily. 90 tablet 7   sodium chloride HYPERTONIC (NEBUSAL) 3 % nebulizer solution USE 1 VIAL IN THE NEBULIZER IN THE MORNING AND USE 1 VIAL IN THE NEBULIZER AT BEDTIME 240 mL 11   TART CHERRY PO Take 1 tablet by mouth daily.     No facility-administered medications prior to visit.    ROS: Review of Systems  Constitutional:  Negative for activity change, appetite change, chills, fatigue and unexpected weight change.  HENT:  Negative for congestion, mouth sores and sinus pressure.   Eyes:  Negative for visual disturbance.  Respiratory:  Negative for cough, chest tightness, shortness of breath and wheezing.   Gastrointestinal:  Negative for abdominal pain and nausea.  Genitourinary:  Positive for vaginal discharge. Negative for difficulty urinating, frequency and vaginal pain.  Musculoskeletal:  Negative for back pain and gait problem.  Skin:  Negative for pallor and rash.  Neurological:  Negative for dizziness, tremors, weakness, numbness and headaches.  Psychiatric/Behavioral:  Negative for confusion and sleep disturbance.    Objective:  BP (!) 144/80 (BP Location: Left Arm)   Pulse (!) 53   Temp 98.2 F (36.8 C) (Oral)   Ht 5\' 2"  (1.575 m)   Wt 107 lb 3.2 oz (48.6 kg)   SpO2 97%   BMI 19.61 kg/m   BP Readings from Last 3 Encounters:  02/16/21 (!) 144/80  01/12/21 129/84  12/17/20 (!) 142/78    Wt Readings from Last 3 Encounters:  02/16/21 107 lb 3.2 oz (48.6 kg)  01/12/21 108 lb (49 kg)  12/17/20 108 lb 6 oz (49.2 kg)    Physical Exam Constitutional:      General: She is not in acute distress.    Appearance: Normal appearance. She is well-developed.  HENT:     Head: Normocephalic.     Right Ear: External ear normal.     Left Ear: External ear normal.     Nose: Nose normal.  Eyes:     General:        Right eye: No discharge.        Left eye: No discharge.     Conjunctiva/sclera: Conjunctivae normal.     Pupils: Pupils are equal, round, and reactive to  light.  Neck:     Thyroid: No thyromegaly.     Vascular: No JVD.     Trachea: No tracheal deviation.  Cardiovascular:     Rate and Rhythm: Normal rate and regular rhythm.     Heart sounds: Normal heart sounds.  Pulmonary:     Effort: No respiratory distress.     Breath sounds: No stridor. No wheezing.  Abdominal:     General: Bowel sounds are normal. There is no distension.     Palpations: Abdomen is soft. There is no mass.     Tenderness: There is no abdominal tenderness. There is no guarding or rebound.  Musculoskeletal:        General: No tenderness.     Cervical back: Normal range of motion and neck supple. No rigidity.  Lymphadenopathy:     Cervical: No cervical adenopathy.  Skin:    Findings: No erythema or rash.  Neurological:     Cranial Nerves: No cranial nerve deficit.     Motor: No abnormal muscle tone.     Coordination: Coordination normal.     Deep Tendon Reflexes: Reflexes normal.  Psychiatric:        Behavior: Behavior normal.        Thought Content: Thought content normal.        Judgment: Judgment normal.   Thin  Lab Results  Component Value Date   WBC 6.2 11/02/2020   HGB 13.9 11/02/2020   HCT 41.8 11/02/2020   PLT 187 11/02/2020   GLUCOSE 92 11/02/2020   CHOL 217 (H) 05/07/2020   TRIG 131 05/07/2020   HDL 59 05/07/2020   LDLDIRECT 140.0 12/13/2017   LDLCALC 133 (H) 05/07/2020   ALT 16 11/02/2020   AST 23 11/02/2020   NA 145 11/02/2020   K 3.5 11/02/2020   CL 103 11/02/2020   CREATININE 0.79 01/12/2021   BUN 9 01/12/2021   CO2 33 (H) 11/02/2020   TSH 1.61 05/07/2020   INR 1.05 01/26/2016    CT Abdomen Pelvis W Contrast  Result Date: 01/27/2021 CLINICAL DATA:  Nausea and abdominal pain. EXAM: CT ABDOMEN AND PELVIS WITH CONTRAST TECHNIQUE: Multidetector CT imaging of the abdomen and pelvis was performed using the standard protocol following bolus administration of intravenous contrast. CONTRAST:  74mL OMNIPAQUE IOHEXOL 350 MG/ML SOLN  COMPARISON:  Ultrasound November 02, 2020 FINDINGS: Lower chest: Calcified granuloma in the left lower lobe. Patchy right middle lobe and lower lobe bronchiectasis with mucoid impaction. Hepatobiliary: No suspicious hepatic lesion. Gallbladder is unremarkable. No biliary ductal dilation. Pancreas: No pancreatic ductal dilatation or surrounding inflammatory changes. Spleen: Within normal limits. Adrenals/Urinary Tract: Adrenal glands are unremarkable. Kidneys  are normal, without renal calculi, solid enhancing lesion, or hydronephrosis. Bladder is unremarkable. Stomach/Bowel: Radiopaque enteric contrast traverses the descending colon. Stomach is decompressed. No pathologic dilation of small or large bowel. The appendix is not definitely seen however there is no pericecal inflammation. Terminal ileum is normal. Sigmoid colonic diverticulosis without findings of acute diverticulitis. Vascular/Lymphatic: Aortic atherosclerosis. No abdominal aortic aneurysm. No pathologically enlarged abdominal or pelvic lymph nodes. Reproductive: Status post hysterectomy. No adnexal masses. Other: No abdominopelvic free fluid. Musculoskeletal: L3 vertebral body and right femoral head bone islands. 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 Diffuse demineralization of bone. IMPRESSION: 1. No acute findings in the abdomen or pelvis. 2. Sigmoid colonic diverticulosis without findings of acute diverticulitis. Electronically Signed   By: Dahlia Bailiff M.D.   On: 01/27/2021 16:51    Assessment & Plan:   Problem List Items Addressed This Visit     B12 deficiency    On Vit B12      Bronchiectasis without complication ([place])    Off abx >1 year  Chest CT is due in March 2023      Vaginitis    New Diflucan was given po      Weight loss    Wt has been stable      Relevant Orders   TSH   Well adult exam - Primary   Relevant Orders   TSH   Urinalysis   CBC with  Differential/Platelet   Lipid panel   Comprehensive metabolic panel   Other Visit Diagnoses     Needs flu shot       Relevant Orders   Flu Vaccine QUAD High Dose(Fluad) (Completed)   Dyslipidemia       Relevant Orders   Lipid panel         Meds ordered this encounter  Medications   fluconazole (DIFLUCAN) 150 MG tablet    Sig: Take 1 tablet (150 mg total) by mouth once for 1 dose.    Dispense:  1 tablet    Refill:  1      Follow-up: No follow-ups on file.  Walker Kehr, MD

## 2021-02-16 NOTE — Assessment & Plan Note (Signed)
Wt has been stable 

## 2021-04-29 ENCOUNTER — Other Ambulatory Visit: Payer: Self-pay | Admitting: *Deleted

## 2021-04-29 MED ORDER — FLUTICASONE PROPIONATE 50 MCG/ACT NA SUSP: 2.0000 | Freq: Every day | NASAL | 3 refills | Status: DC | PRN

## 2021-04-29 MED ORDER — CALCITONIN (SALMON) 200 UNIT/ACT NA SOLN: 1.0000 | Freq: Every day | NASAL | 5 refills | Status: DC

## 2021-05-26 ENCOUNTER — Other Ambulatory Visit: Payer: Self-pay | Admitting: Internal Medicine

## 2021-06-22 ENCOUNTER — Other Ambulatory Visit: Payer: Self-pay

## 2021-06-22 ENCOUNTER — Encounter (HOSPITAL_COMMUNITY): Payer: Self-pay

## 2021-06-22 ENCOUNTER — Ambulatory Visit (HOSPITAL_COMMUNITY)
Admission: RE | Admit: 2021-06-22 | Discharge: 2021-06-22 | Disposition: A | Discharge status: Home / Self Care | Payer: Medicare Other | Source: Ambulatory Visit | Attending: Pulmonary Disease | Admitting: Pulmonary Disease

## 2021-06-22 DIAGNOSIS — J479 Bronchiectasis, uncomplicated: Secondary | ICD-10-CM | POA: Diagnosis not present

## 2021-07-10 ENCOUNTER — Ambulatory Visit (INDEPENDENT_AMBULATORY_CARE_PROVIDER_SITE_OTHER): Payer: Medicare Other

## 2021-07-10 VITALS — Wt 107.0 lb

## 2021-07-10 DIAGNOSIS — Z0000 Encounter for general adult medical examination without abnormal findings: Secondary | ICD-10-CM | POA: Diagnosis not present

## 2021-07-10 NOTE — Progress Notes (Signed)
? ?Subjective:  ? Lindsay Schultz is a 71 y.o. female who presents for Medicare Annual (Subsequent) preventive examination. ? ?Virtual Visit via Telephone Note ? ?I connected with  Lindsay Schultz on 07/10/21 at  2:30 PM EDT by telephone and verified that I am speaking with the correct person using two identifiers. ? ?Location: ?Patient: Home ?Provider: [facility] ?Persons participating in the virtual visit: patient/Nurse Health Advisor ?  ?I discussed the limitations, risks, security and privacy concerns of performing an evaluation and management service by telephone and the availability of in person appointments. The patient expressed understanding and agreed to proceed. ? ?Interactive audio and video telecommunications were attempted between this nurse and patient, however failed, due to patient having technical difficulties OR patient did not have access to video capability.  We continued and completed visit with audio only. ? ?Some vital signs may be absent or patient reported.  ? ?Eean Buss Dionne Ano, LPN  ? ?Review of Systems    ? ?Cardiac Risk Factors include: advanced age (>18mn, >>91women);family history of premature cardiovascular disease;hypertension;Other (see comment), Risk factor comments: hereditary spherocytosis ? ?   ?Objective:  ?  ?Today's Vitals  ? 07/10/21 1428  ?Weight: 107 lb (48.5 kg)  ? ?Body mass index is 19.57 kg/m?. ? ? ?  07/10/2021  ?  2:37 PM 11/02/2020  ? 10:26 AM 07/01/2020  ?  2:12 PM 02/21/2018  ?  8:26 AM 12/13/2016  ?  2:19 PM 01/24/2016  ?  2:00 PM  ?Advanced Directives  ?Does Patient Have a Medical Advance Directive? Yes Yes Yes Yes Yes No  ?[place] of [place] Healthcare Power of Attorney Living will;Healthcare Power of [place]   ?Does patient want to make changes to medical advance directive?   No - Patient declined     ?[place] of [place]? No - copy requested  No - copy requested No - copy requested No - copy requested   ?Would patient like information on creating a medical advance directive?      No - patient declined information  ? ? ?Current Medications (verified) ?Outpatient Encounter Medications as of 07/10/2021  ?Medication Sig  ? acetaminophen (TYLENOL) 500 MG tablet Take 500-1,000 mg by mouth every 6 (six) hours as needed (for pain.).  ? albuterol (VENTOLIN HFA) 108 (90 Base) MCG/ACT inhaler inhale 2 puffs EVERY 6 HOURS AS NEEDED wheezing & SHORTNESS OF BREATH  ? amLODipine (NORVASC) 2.5 MG tablet TAKE 1 TABLET BY MOUTH EVERY DAY  ? butalbital-acetaminophen-caffeine (FIORICET) 50-325-40 MG tablet Take 1 tablet by mouth every 6 (six) hours as needed for headache.  ? calcitonin, salmon, (MIACALCIN/FORTICAL) 200 UNIT/ACT nasal spray Place 1 spray into alternate nostrils daily.  ? Carboxymethylcellulose Sodium 0.25 % SOLN Place 1 drop into both eyes 3 (three) times daily as needed (for dry/irritated eyes.).  ? cholecalciferol (VITAMIN D) 1000 units tablet Take 1,000 Units by mouth 2 (two) times daily.  ? CRANBERRY PO Take 1 capsule by mouth every evening.  ? Cyanocobalamin (VITAMIN B-12) 500 MCG SUBL 1 tab sl qd (Patient taking differently: Take 500 mcg by mouth 2 (two) times daily.)  ? famotidine (PEPCID) 20 MG tablet Take 20 mg by mouth daily.  ? fluticasone (FLONASE) 50 MCG/ACT nasal spray Place 2 sprays into both nostrils daily as needed for allergies.  ? HYDROcodone-acetaminophen (NORCO) 5-325 MG tablet Take 1 tablet  by mouth every 6 (six) hours as needed for severe pain.  ? nadolol (CORGARD) 40 MG tablet Take 0.5 tablets (20 mg total) by mouth 2 (two) times daily.  ? pantoprazole (PROTONIX) 40 MG tablet Take 1 tablet (40 mg total) by mouth 2 (two) times daily.  ? sodium chloride HYPERTONIC (NEBUSAL) 3 % nebulizer solution USE 1 VIAL IN THE NEBULIZER IN THE MORNING AND USE 1 VIAL IN THE NEBULIZER AT BEDTIME  ? TART CHERRY PO Take 1  tablet by mouth daily.  ? ?No facility-administered encounter medications on file as of 07/10/2021.  ? ? ?Allergies (verified) ?Meloxicam, Paroxetine, Penicillins, Zanaflex [tizanidine hcl], Ciprofloxacin, Clarithromycin, and Zithromax [azithromycin]  ? ?History: ?Past Medical History:  ?Diagnosis Date  ? Allergic rhinitis   ? Benign paroxysmal positional vertigo 06/14/2015  ? Breast cancer ([place]) 2008  ? Bronchiectasis ([place])   ? GERD (gastroesophageal reflux disease)   ? HTN (hypertension)   ? Hx of breast cancer   ? Osteopenia   ? Personal history of radiation therapy 2008  ? ?Past Surgical History:  ?Procedure Laterality Date  ? ABDOMINAL HYSTERECTOMY    ? APPENDECTOMY    ? BREAST LUMPECTOMY  2008  ? Left  ? NASAL SINUS SURGERY  1997  ? VIDEO BRONCHOSCOPY Bilateral 02/21/2018  ? Procedure: VIDEO BRONCHOSCOPY WITHOUT FLUORO;  Surgeon: Marshell Garfinkel, MD;  Location: [facility];  Service: Cardiopulmonary;  Laterality: Bilateral;  ? ?Family History  ?Problem Relation Age of Onset  ? Sjogren's syndrome Mother   ? Arthritis Father 41  ?     RA  ? Emphysema Father   ? Heart disease Father   ? Hypertension Other   ? Hodgkin's lymphoma Brother   ? ?Social History  ? ?Socioeconomic History  ? Marital status: Married  ?  Spouse name: Not on file  ? Number of children: Not on file  ? Years of education: Not on file  ? Highest education level: Not on file  ?Occupational History  ? Occupation: Retired  ?  Comment: Social Services  ?Tobacco Use  ? Smoking status: Never  ? Smokeless tobacco: Never  ?Vaping Use  ? Vaping Use: Never used  ?Substance and Sexual Activity  ? Alcohol use: No  ?  Alcohol/week: 0.0 standard drinks  ? Drug use: No  ? Sexual activity: Yes  ?Other Topics Concern  ? Not on file  ?Social History Narrative  ? Regular Exercise- yes  ? ?Social Determinants of Health  ? ?Financial Resource Strain: Low Risk   ? Difficulty of Paying Living Expenses: Not hard at all  ?Food Insecurity: No Food Insecurity  ? Worried  About Charity fundraiser in the Last Year: Never true  ? Ran Out of Food in the Last Year: Never true  ?Transportation Needs: No Transportation Needs  ? Lack of Transportation (Medical): No  ? Lack of Transportation (Non-Medical): No  ?Physical Activity: Sufficiently Active  ? Days of Exercise per Week: 5 days  ? Minutes of Exercise per Session: 30 min  ?Stress: No Stress Concern Present  ? Feeling of Stress : Not at all  ?Social Connections: Socially Integrated  ? Frequency of Communication with Friends and Family: More than three times a week  ? Frequency of Social Gatherings with Friends and Family: More than three times a week  ? Attends Religious Services: More than 4 times per year  ? Active Member of Clubs or Organizations: Yes  ? Attends Archivist Meetings: More than 4  times per year  ? Marital Status: Married  ? ? ?Tobacco Counseling ?Counseling given: Not Answered ? ? ?Clinical Intake: ? ?Pre-visit preparation completed: Yes ? ?Pain : No/denies pain ? ?  ? ?BMI - recorded: 19.57 ?Nutritional Status: BMI of 19-24  Normal ?Nutritional Risks: Nausea/ vomitting/ diarrhea (intermittent nausea) ?Diabetes: No ? ?How often do you need to have someone help you when you read instructions, pamphlets, or other written materials from your doctor or pharmacy?: 1 - Never ? ?Diabetic? no ? ?Interpreter Needed?: No ? ?Information entered by :: Daisy Mcneel ,LPN ? ? ?Activities of Daily Living ? ?  07/10/2021  ?  2:37 PM  ?In your present state of health, do you have any difficulty performing the following activities:  ?Hearing? 0  ?Vision? 0  ?Difficulty concentrating or making decisions? 0  ?Walking or climbing stairs? 0  ?Dressing or bathing? 0  ?Doing errands, shopping? 0  ?Preparing Food and eating ? N  ?Using the Toilet? N  ?In the past six months, have you accidently leaked urine? N  ?Do you have problems with loss of bowel control? N  ?Managing your Medications? N  ?Managing your Finances? N  ?Housekeeping  or managing your Housekeeping? N  ? ? ?Patient Care Team: ?Plotnikov, Evie Lacks, MD as PCP - General ?Marshell Garfinkel, MD as Consulting Physician (Pulmonary Disease) ?Ladene Artist, MD as Consulting

## 2021-07-10 NOTE — Patient Instructions (Signed)
Lindsay Schultz , ?Thank you for taking time to come for your Medicare Wellness Visit. I appreciate your ongoing commitment to your health goals. Please review the following plan we discussed and let me know if I can assist you in the future.  ? ?Screening recommendations/referrals: ?Colonoscopy: Done 01/12/2021 - likely no repeat required ?Mammogram: Done 01/07/2021 - Repeat annually ?Bone Density: Done 11/13/2015 - Recommend repeat every 2 years *discuss with Dr Alain Marion at next visit ?Recommended yearly ophthalmology/optometry visit for glaucoma screening and checkup ?Recommended yearly dental visit for hygiene and checkup ? ?Vaccinations: ?Influenza vaccine: Done 02/16/2021 - Repeat annually ?Pneumococcal vaccine: Done 11/07/2015 & 05/07/2020 ?Tdap vaccine: Done 09/26/2012 - Repeat in 10 years ?Shingles vaccine: Due - Shingrix is 2 doses 2-6 months apart and over 90% effective     ?Covid-19: Done 06/12/2019 & 07/02/2019 - For boosters, contact pharmacy ? ?Advanced directives: Please bring a copy of your health care power of attorney and living will to the office to be added to your chart at your convenience.  ? ?Conditions/risks identified:  Keep up the great work! Aim for 30 minutes of exercise or brisk walking, 6-8 glasses of water, and 5 servings of fruits and vegetables each day.  ? ?Next appointment: Follow up in one year for your annual wellness visit  ? ? ?Preventive Care 11 Years and Older, Female ?Preventive care refers to lifestyle choices and visits with your health care provider that can promote health and wellness. ?What does preventive care include? ?A yearly physical exam. This is also called an annual well check. ?Dental exams once or twice a year. ?Routine eye exams. Ask your health care provider how often you should have your eyes checked. ?Personal lifestyle choices, including: ?Daily care of your teeth and gums. ?Regular physical activity. ?Eating a healthy diet. ?Avoiding tobacco and drug use. ?Limiting  alcohol use. ?Practicing safe sex. ?Taking low-dose aspirin every day. ?Taking vitamin and mineral supplements as recommended by your health care provider. ?What happens during an annual well check? ?The services and screenings done by your health care provider during your annual well check will depend on your age, overall health, lifestyle risk factors, and family history of disease. ?Counseling  ?Your health care provider may ask you questions about your: ?Alcohol use. ?Tobacco use. ?Drug use. ?Emotional well-being. ?Home and relationship well-being. ?Sexual activity. ?Eating habits. ?History of falls. ?Memory and ability to understand (cognition). ?Work and work Statistician. ?Reproductive health. ?Screening  ?You may have the following tests or measurements: ?Height, weight, and BMI. ?Blood pressure. ?Lipid and cholesterol levels. These may be checked every 5 years, or more frequently if you are over 77 years old. ?Skin check. ?Lung cancer screening. You may have this screening every year starting at age 37 if you have a 30-pack-year history of smoking and currently smoke or have quit within the past 15 years. ?Fecal occult blood test (FOBT) of the stool. You may have this test every year starting at age 33. ?Flexible sigmoidoscopy or colonoscopy. You may have a sigmoidoscopy every 5 years or a colonoscopy every 10 years starting at age 55. ?Hepatitis C blood test. ?Hepatitis B blood test. ?Sexually transmitted disease (STD) testing. ?Diabetes screening. This is done by checking your blood sugar (glucose) after you have not eaten for a while (fasting). You may have this done every 1-3 years. ?Bone density scan. This is done to screen for osteoporosis. You may have this done starting at age 79. ?Mammogram. This may be done every 1-2 years.  Talk to your health care provider about how often you should have regular mammograms. ?Talk with your health care provider about your test results, treatment options, and if  necessary, the need for more tests. ?Vaccines  ?Your health care provider may recommend certain vaccines, such as: ?Influenza vaccine. This is recommended every year. ?Tetanus, diphtheria, and acellular pertussis (Tdap, Td) vaccine. You may need a Td booster every 10 years. ?Zoster vaccine. You may need this after age 51. ?Pneumococcal 13-valent conjugate (PCV13) vaccine. One dose is recommended after age 75. ?Pneumococcal polysaccharide (PPSV23) vaccine. One dose is recommended after age 13. ?Talk to your health care provider about which screenings and vaccines you need and how often you need them. ?This information is not intended to replace advice given to you by your health care provider. Make sure you discuss any questions you have with your health care provider. ?Document Released: 05/02/2015 Document Revised: 12/24/2015 Document Reviewed: 02/04/2015 ?Elsevier Interactive Patient Education ? 2017 [place]. ? ?Fall Prevention in the Home ?Falls can cause injuries. They can happen to people of all ages. There are many things you can do to make your home safe and to help prevent falls. ?What can I do on the outside of my home? ?Regularly fix the edges of walkways and driveways and fix any cracks. ?Remove anything that might make you trip as you walk through a door, such as a raised step or threshold. ?Trim any bushes or trees on the path to your home. ?Use bright outdoor lighting. ?Clear any walking paths of anything that might make someone trip, such as rocks or tools. ?Regularly check to see if handrails are loose or broken. Make sure that both sides of any steps have handrails. ?Any raised decks and porches should have guardrails on the edges. ?Have any leaves, snow, or ice cleared regularly. ?Use sand or salt on walking paths during winter. ?Clean up any spills in your garage right away. This includes oil or grease spills. ?What can I do in the bathroom? ?Use night lights. ?Install grab bars by the toilet  and in the tub and shower. Do not use towel bars as grab bars. ?Use non-skid mats or decals in the tub or shower. ?If you need to sit down in the shower, use a plastic, non-slip stool. ?Keep the floor dry. Clean up any water that spills on the floor as soon as it happens. ?Remove soap buildup in the tub or shower regularly. ?Attach bath mats securely with double-sided non-slip rug tape. ?Do not have throw rugs and other things on the floor that can make you trip. ?What can I do in the bedroom? ?Use night lights. ?Make sure that you have a light by your bed that is easy to reach. ?Do not use any sheets or blankets that are too big for your bed. They should not hang down onto the floor. ?Have a firm chair that has side arms. You can use this for support while you get dressed. ?Do not have throw rugs and other things on the floor that can make you trip. ?What can I do in the kitchen? ?Clean up any spills right away. ?Avoid walking on wet floors. ?Keep items that you use a lot in easy-to-reach places. ?If you need to reach something above you, use a strong step stool that has a grab bar. ?Keep electrical cords out of the [place]. ?Do not use floor polish or wax that makes floors slippery. If you must use wax, use non-skid floor wax. ?  Do not have throw rugs and other things on the floor that can make you trip. ?What can I do with my stairs? ?Do not leave any items on the stairs. ?Make sure that there are handrails on both sides of the stairs and use them. Fix handrails that are broken or loose. Make sure that handrails are as long as the stairways. ?Check any carpeting to make sure that it is firmly attached to the stairs. Fix any carpet that is loose or worn. ?Avoid having throw rugs at the top or bottom of the stairs. If you do have throw rugs, attach them to the floor with carpet tape. ?Make sure that you have a light switch at the top of the stairs and the bottom of the stairs. If you do not have them, ask someone to add  them for you. ?What else can I do to help prevent falls? ?Wear shoes that: ?Do not have high heels. ?Have rubber bottoms. ?Are comfortable and fit you well. ?Are closed at the toe. Do not wear sandals. ?

## 2021-07-20 ENCOUNTER — Encounter: Payer: Self-pay | Admitting: Pulmonary Disease

## 2021-07-20 ENCOUNTER — Ambulatory Visit (INDEPENDENT_AMBULATORY_CARE_PROVIDER_SITE_OTHER): Payer: Medicare Other | Admitting: Pulmonary Disease

## 2021-07-20 VITALS — BP 120/66 | HR 58 | Temp 97.8°F | Ht 63.0 in | Wt 105.4 lb

## 2021-07-20 DIAGNOSIS — J479 Bronchiectasis, uncomplicated: Secondary | ICD-10-CM | POA: Diagnosis not present

## 2021-07-20 NOTE — Patient Instructions (Signed)
I am glad you are doing well with your breathing ?CT from last month looks good with minimal change ?We will repeat a high-resolution CT in 1 year ?Return to clinic in 1 year ?

## 2021-07-20 NOTE — Progress Notes (Signed)
? ?      ?Lindsay Schultz    [id number]    Mar 07, 1951 ? ?Primary Care Physician:Plotnikov, Evie Lacks, MD ? ?Referring Physician: Cassandria Anger, MD ?[place],  [place] [id number] ? ?Chief complaint:   ?Follow up for ?Bronchiectasis with MAI infection ?Sinusitis  ?Seasonal allergies ?GERD ? ?HPI: ?Mrs. Johnsen is a 71 year old female with history of asthmatic bronchitis, sinusitis, and seasonal allergies.   She's had a workup for her bronchiectasis including CBC, Ig levels, sputum cultures, A1AT levels, RF which were normal. PFTs do not show any obstruction and very mild restriction and reduction in diffusion capacity.  ? ?Underwent bronchoscopy on 12/22/2017 with cultures growing MAI ?She has been seen by ID and started on antibiotic therapy. Managed by Dr. Linus Salmons ?Completed 18 months of therapy.  Taken off antimicrobial therapy on June 2021 with intention to follow-up with CT. ?Per ID note if things worsen either clinically or radiographically + clinically, will consider restarting 3 drug therapy + three times weekly amikacin.   ? ?Had a mild bronchiectasis flare in January 2022 requiring prednisone for 5 days ?Finished pulmonary rehab in [place] in early 2022 ? ?She has attacks of bronchiectasis exacerbations about once a year. She states that the infection usually begins in the sinus and then moves into the lung. This usually resolves after course of antibiotics. She has H/O seasonal allergies (worst in fall and spring) and asthmatic bronchitis. Never smoked.  ? ?Interim history: ?She is here for review of CT scan ?States her breathing is stable without issue. ?Using percussion vest.  Saline nebs are on backorder ? ?Outpatient Encounter Medications as of 07/20/2021  ?Medication Sig  ? acetaminophen (TYLENOL) 500 MG tablet Take 500-1,000 mg by mouth every 6 (six) hours as needed (for pain.).  ? albuterol (VENTOLIN HFA) 108 (90 Base) MCG/ACT inhaler inhale 2 puffs EVERY 6 HOURS AS NEEDED wheezing &  SHORTNESS OF BREATH  ? amLODipine (NORVASC) 2.5 MG tablet TAKE 1 TABLET BY MOUTH EVERY DAY  ? butalbital-acetaminophen-caffeine (FIORICET) 50-325-40 MG tablet Take 1 tablet by mouth every 6 (six) hours as needed for headache.  ? calcitonin, salmon, (MIACALCIN/FORTICAL) 200 UNIT/ACT nasal spray Place 1 spray into alternate nostrils daily.  ? Carboxymethylcellulose Sodium 0.25 % SOLN Place 1 drop into both eyes 3 (three) times daily as needed (for dry/irritated eyes.).  ? cholecalciferol (VITAMIN D) 1000 units tablet Take 1,000 Units by mouth 2 (two) times daily.  ? CRANBERRY PO Take 1 capsule by mouth every evening.  ? Cyanocobalamin (VITAMIN B-12) 500 MCG SUBL 1 tab sl qd (Patient taking differently: Take 500 mcg by mouth 2 (two) times daily.)  ? famotidine (PEPCID) 20 MG tablet Take 20 mg by mouth daily.  ? fluticasone (FLONASE) 50 MCG/ACT nasal spray Place 2 sprays into both nostrils daily as needed for allergies.  ? nadolol (CORGARD) 40 MG tablet Take 0.5 tablets (20 mg total) by mouth 2 (two) times daily.  ? pantoprazole (PROTONIX) 40 MG tablet Take 1 tablet (40 mg total) by mouth 2 (two) times daily.  ? sodium chloride HYPERTONIC (NEBUSAL) 3 % nebulizer solution USE 1 VIAL IN THE NEBULIZER IN THE MORNING AND USE 1 VIAL IN THE NEBULIZER AT BEDTIME  ? [DISCONTINUED] HYDROcodone-acetaminophen (NORCO) 5-325 MG tablet Take 1 tablet by mouth every 6 (six) hours as needed for severe pain.  ? [DISCONTINUED] TART CHERRY PO Take 1 tablet by mouth daily.  ? ?No facility-administered encounter medications on file as of 07/20/2021.  ? ?  Physical Exam: ?Blood pressure 120/66, pulse (!) 58, temperature 97.8 ?F (36.6 ?C), temperature source Oral, height _0  (1.6 m), weight 105 lb 6.4 oz (47.8 kg), SpO2 100 %. ?Gen:      No acute distress ?HEENT:  EOMI, sclera anicteric ?Neck:     No masses; no thyromegaly ?Lungs:    Clear to auscultation bilaterally; normal respiratory effort ?CV:         Regular rate and rhythm; no  murmurs ?Abd:      + bowel sounds; soft, non-tender; no palpable masses, no distension ?Ext:    No edema; adequate peripheral perfusion ?Skin:      Warm and dry; no rash ?Neuro: alert and oriented x 3 ?Psych: normal mood and affect  ? ?Data Reviewed: ?Imaging ?CT scan 11/15/14-Multiple nodules in the left upper lobe, bilateral bronchiectasis. ?CT scan 01/24/16 Stable pulmonary nodules, bilateral bronchiectasis. Left upper lobe ground glass opacities. Images reviewed. ?CT scan 11/16/2017- stable pulmonary nodules, bilateral bronchiectasis, new area of opacity in the right upper lobe consistent with mucus impaction.  ?CT chest 07/25/2019-stable bronchiectasis with mild increase in tree-in-bud appearance in the right upper lobe.  Stable pulmonary nodules. ?CT chest 01/29/2020-slight interval worsening of tree-in-bud appearance with nodular consolidation in the apical right upper lobe. ?CT chest 06/19/2020-increased nodularity of the lungs, bronchiectasis ?CT chest 06/22/2021-stable inflammatory changes in the lungs with tree-in-bud suggestive of chronic MAI. ?I reviewed the images personally. ? ?Labs ?12/09/14 ?IgG-773 ?IgG 8-2 37 ?IgM-64 ?IgE-56 ?Rheumatoid factor < 10 ?All within normal limits. ?  ?AFB and fungal cultures 11/19/14- no acid-fast bacilli, Candida albicans. 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 ?Repeat sputum AFB 01/05/2018-negative ? ?Bronchoscopy 02/21/2018 ?Cultures- Mycobacterium avium complex ?BAL cell count-WBC 635, 9% lymphs, 1% eos, 76% neutrophils ?  ?PFTs  ?02/10/15 ?FVC 2.35 (74%), FEV1 1.79 (74%), F/F 77, TLC 79%, DLCO 78% ?No obstruction. Mild reduction in lung volumes and diffusion capacity ? ?Assessment:  ?Bronchiectasis, MAC infection ?Off treatment since June 2021 after completing 18 months of therapy ?CT scan changes are stable ? ?Discussed with Dr. Linus Salmons with ID.  We will continue to observe for now ?If she needs to go back on therapy then will need to use inhaled versus IV  amikacin ? ?Continue chest percussion with vest and Mucinex for mucociliary clearance. ?Hypertonic saline is on backorder ? ?Asthmatic  Bronchitis ?She is asymptomatic with symptoms mostly during spring and fall allergy. She'll continue the albuterol when necessary. ? ?GERD ?Continue Zantac. ? ?Health maintenance ?02/07/2016-Prevnar ?05/28/2014-Pneumovax ? ?Up-to-date with flu and Covid.  ? ?Plan/Recommendations: ?- High-res CT in 1 year and follow-up in clinic ?- Continue albuterol as needed ?- Percussion vest, resume hypertonic saline when available ? ?Marshell Garfinkel MD ?[facility] ?07/20/2021, 11:34 AM ? ?CC: Plotnikov, Evie Lacks, MD ? ? ?

## 2021-08-17 ENCOUNTER — Ambulatory Visit (INDEPENDENT_AMBULATORY_CARE_PROVIDER_SITE_OTHER): Payer: Medicare Other | Admitting: Internal Medicine

## 2021-08-17 ENCOUNTER — Encounter: Payer: Self-pay | Admitting: Internal Medicine

## 2021-08-17 DIAGNOSIS — E538 Deficiency of other specified B group vitamins: Secondary | ICD-10-CM

## 2021-08-17 DIAGNOSIS — A310 Pulmonary mycobacterial infection: Secondary | ICD-10-CM

## 2021-08-17 DIAGNOSIS — R634 Abnormal weight loss: Secondary | ICD-10-CM

## 2021-08-17 DIAGNOSIS — J479 Bronchiectasis, uncomplicated: Secondary | ICD-10-CM | POA: Diagnosis not present

## 2021-08-17 LAB — COMPREHENSIVE METABOLIC PANEL
ALT: 12 U/L (ref 0–35)
AST: 20 U/L (ref 0–37)
Albumin: 4.1 g/dL (ref 3.5–5.2)
Alkaline Phosphatase: 96 U/L (ref 39–117)
BUN: 10 mg/dL (ref 6–23)
CO2: 32 mEq/L (ref 19–32)
Calcium: 9.1 mg/dL (ref 8.4–10.5)
Chloride: 103 mEq/L (ref 96–112)
Creatinine, Ser: 0.81 mg/dL (ref 0.40–1.20)
GFR: 73.59 mL/min (ref >60.00)
Glucose, Bld: 89 mg/dL (ref 70–99)
Potassium: 3.8 mEq/L (ref 3.5–5.1)
Sodium: 142 mEq/L (ref 135–145)
Total Bilirubin: 0.3 mg/dL (ref 0.2–1.2)
Total Protein: 6.8 g/dL (ref 6.0–8.3)

## 2021-08-17 LAB — CBC WITH DIFFERENTIAL/PLATELET
Basophils Absolute: 0 10*3/uL (ref 0.0–0.1)
Basophils Relative: 0.4 % (ref 0.0–3.0)
Eosinophils Absolute: 0.1 10*3/uL (ref 0.0–0.7)
Eosinophils Relative: 2 % (ref 0.0–5.0)
HCT: 38.2 % (ref 36.0–46.0)
Hemoglobin: 12.8 g/dL (ref 12.0–15.0)
Lymphocytes Relative: 28 % (ref 12.0–46.0)
Lymphs Abs: 1.7 10*3/uL (ref 0.7–4.0)
MCHC: 33.4 g/dL (ref 30.0–36.0)
MCV: 91.1 fl (ref 78.0–100.0)
Monocytes Absolute: 0.5 10*3/uL (ref 0.1–1.0)
Monocytes Relative: 7.9 % (ref 3.0–12.0)
Neutro Abs: 3.6 10*3/uL (ref 1.4–7.7)
Neutrophils Relative %: 61.7 % (ref 43.0–77.0)
Platelets: 182 10*3/uL (ref 150.0–400.0)
RBC: 4.2 Mil/uL (ref 3.87–5.11)
RDW: 13 % (ref 11.5–15.5)
WBC: 5.9 10*3/uL (ref 4.0–10.5)

## 2021-08-17 MED ORDER — NADOLOL 40 MG PO TABS: 20.0000 mg | ORAL_TABLET | Freq: Two times a day (BID) | ORAL | 3 refills | Status: DC

## 2021-08-17 MED ORDER — FLUTICASONE PROPIONATE 50 MCG/ACT NA SUSP: 2.0000 | Freq: Every day | NASAL | 3 refills | Status: DC | PRN

## 2021-08-17 NOTE — Progress Notes (Signed)
Subjective:  Patient ID: Lindsay Schultz, female    DOB: 1950-06-15  Age: 71 y.o. MRN: [id number]  CC: Follow-up   HPI Lindsay Schultz presents for HTN, GERD, lung disease  Outpatient Medications Prior to Visit  Medication Sig Dispense Refill   acetaminophen (TYLENOL) 500 MG tablet Take 500-1,000 mg by mouth every 6 (six) hours as needed (for pain.).     albuterol (VENTOLIN HFA) 108 (90 Base) MCG/ACT inhaler inhale 2 puffs EVERY 6 HOURS AS NEEDED wheezing & SHORTNESS OF BREATH 18 g 11   amLODipine (NORVASC) 2.5 MG tablet TAKE 1 TABLET BY MOUTH EVERY DAY 90 tablet 3   butalbital-acetaminophen-caffeine (FIORICET) 50-325-40 MG tablet Take 1 tablet by mouth every 6 (six) hours as needed for headache. 60 tablet 1   calcitonin, salmon, (MIACALCIN/FORTICAL) 200 UNIT/ACT nasal spray Place 1 spray into alternate nostrils daily. 3.7 mL 5   Carboxymethylcellulose Sodium 0.25 % SOLN Place 1 drop into both eyes 3 (three) times daily as needed (for dry/irritated eyes.).     cholecalciferol (VITAMIN D) 1000 units tablet Take 1,000 Units by mouth 2 (two) times daily.     CRANBERRY PO Take 1 capsule by mouth every evening.     Cyanocobalamin (VITAMIN B-12) 500 MCG SUBL 1 tab sl qd (Patient taking differently: Take 500 mcg by mouth 2 (two) times daily.) 100 tablet 11   famotidine (PEPCID) 20 MG tablet Take 20 mg by mouth daily.     pantoprazole (PROTONIX) 40 MG tablet Take 1 tablet (40 mg total) by mouth 2 (two) times daily. 90 tablet 7   sodium chloride HYPERTONIC (NEBUSAL) 3 % nebulizer solution USE 1 VIAL IN THE NEBULIZER IN THE MORNING AND USE 1 VIAL IN THE NEBULIZER AT BEDTIME 240 mL 11   fluticasone (FLONASE) 50 MCG/ACT nasal spray Place 2 sprays into both nostrils daily as needed for allergies. 16 g 3   nadolol (CORGARD) 40 MG tablet Take 0.5 tablets (20 mg total) by mouth 2 (two) times daily. 30 tablet 11   No facility-administered medications prior to visit.    ROS: Review of Systems   Constitutional:  Negative for activity change, appetite change, chills, fatigue and unexpected weight change.  HENT:  Negative for congestion, mouth sores and sinus pressure.   Eyes:  Negative for visual disturbance.  Respiratory:  Negative for cough and chest tightness.   Gastrointestinal:  Negative for abdominal pain and nausea.  Genitourinary:  Negative for difficulty urinating, frequency and vaginal pain.  Musculoskeletal:  Negative for back pain and gait problem.  Skin:  Negative for pallor and rash.  Neurological:  Negative for dizziness, tremors, weakness, numbness and headaches.  Psychiatric/Behavioral:  Negative for confusion and sleep disturbance.    Objective:  BP 136/80 (BP Location: Left Arm, Patient Position: Sitting, Cuff Size: Normal)   Pulse 85   Temp 97.8 F (36.6 C) (Oral)   Ht 5\' 3"  (1.6 m)   Wt 107 lb (48.5 kg)   SpO2 95%   BMI 18.95 kg/m   BP Readings from Last 3 Encounters:  08/17/21 136/80  07/20/21 120/66  02/16/21 (!) 144/80    Wt Readings from Last 3 Encounters:  08/17/21 107 lb (48.5 kg)  07/20/21 105 lb 6.4 oz (47.8 kg)  07/10/21 107 lb (48.5 kg)    Physical Exam Constitutional:      General: She is not in acute distress.    Appearance: She is well-developed.  HENT:     Head: Normocephalic.  Right Ear: External ear normal.     Left Ear: External ear normal.     Nose: Nose normal.  Eyes:     General:        Right eye: No discharge.        Left eye: No discharge.     Conjunctiva/sclera: Conjunctivae normal.     Pupils: Pupils are equal, round, and reactive to light.  Neck:     Thyroid: No thyromegaly.     Vascular: No JVD.     Trachea: No tracheal deviation.  Cardiovascular:     Rate and Rhythm: Normal rate and regular rhythm.     Heart sounds: Normal heart sounds.  Pulmonary:     Effort: No respiratory distress.     Breath sounds: No stridor. No wheezing.  Abdominal:     General: Bowel sounds are normal. There is no  distension.     Palpations: Abdomen is soft. There is no mass.     Tenderness: There is no abdominal tenderness. There is no guarding or rebound.  Musculoskeletal:        General: No tenderness.     Cervical back: Normal range of motion and neck supple. No rigidity.  Lymphadenopathy:     Cervical: No cervical adenopathy.  Skin:    Findings: No erythema or rash.  Neurological:     Cranial Nerves: No cranial nerve deficit.     Motor: No abnormal muscle tone.     Coordination: Coordination normal.     Deep Tendon Reflexes: Reflexes normal.  Psychiatric:        Behavior: Behavior normal.        Thought Content: Thought content normal.        Judgment: Judgment normal.    Lab Results  Component Value Date   WBC 5.0 02/16/2021   HGB 12.5 02/16/2021   HCT 38.0 02/16/2021   PLT 201.0 02/16/2021   GLUCOSE 81 02/16/2021   CHOL 202 (H) 02/16/2021   TRIG 135.0 02/16/2021   HDL 56.50 02/16/2021   LDLDIRECT 140.0 12/13/2017   LDLCALC 119 (H) 02/16/2021   ALT 11 02/16/2021   AST 17 02/16/2021   NA 143 02/16/2021   K 3.9 02/16/2021   CL 104 02/16/2021   CREATININE 0.60 02/16/2021   BUN 9 02/16/2021   CO2 31 02/16/2021   TSH 1.08 02/16/2021   INR 1.05 01/26/2016    CT Chest Wo Contrast  Result Date: 06/23/2021 CLINICAL DATA:  71 year old female with history of bronchiectasis. EXAM: CT CHEST WITHOUT CONTRAST TECHNIQUE: Multidetector CT imaging of the chest was performed following the standard protocol without IV contrast. RADIATION DOSE REDUCTION: This exam was performed according to the departmental dose-optimization program which includes automated exposure control, adjustment of the mA and/or kV according to patient size and/or use of iterative reconstruction technique. COMPARISON:  Chest CT 06/19/2020. FINDINGS: Cardiovascular: Heart size is normal. There is no significant pericardial fluid, thickening or pericardial calcification. There is aortic atherosclerosis, as well as  atherosclerosis of the great vessels of the mediastinum and the coronary arteries, including calcified atherosclerotic plaque in the left anterior descending coronary artery. Mediastinum/Nodes: No pathologically enlarged mediastinal or hilar lymph nodes. Esophagus is unremarkable in appearance. No axillary lymphadenopathy. Lungs/Pleura: Scattered areas of cylindrical and varicose bronchiectasis are again noted throughout the lungs bilaterally, with associated thickening of the peribronchovascular interstitium, regional areas of architectural distortion and volume loss, and extensive peribronchovascular micro and macronodularity, which is most compatible with areas of chronic mucoid impaction  within terminal bronchioles. The extent of disease is essentially unchanged compared to the prior examination. No acute consolidative airspace disease. No pleural effusions. No other larger more suspicious appearing pulmonary nodules or masses are noted. Upper Abdomen: Aortic atherosclerosis. Musculoskeletal: There are no aggressive appearing lytic or blastic lesions noted in the visualized portions of the skeleton. IMPRESSION: 1. The appearance the chest is very similar to the prior study, again suggestive of a chronic indolent atypical infectious process such as MAI (mycobacterium avium intracellulare), as above. 2. Aortic atherosclerosis, in addition to left anterior descending coronary artery disease. Assessment for potential risk factor modification, dietary therapy or pharmacologic therapy may be warranted, if clinically indicated. Aortic Atherosclerosis (ICD10-I70.0). Electronically Signed   By: Trudie Reed M.D.   On: 06/23/2021 07:18    Assessment & Plan:   Problem List Items Addressed This Visit     B12 deficiency    Borderline low B12 Cont on B12      Bronchiectasis without complication (HCC)    F/u Dr Isaiah Serge On a percussive vest daily       Pulmonary mycobacterial infection (HCC)    F/u Dr  Isaiah Serge On a percussive vest daily Off abx       Weight loss    Wt Readings from Last 3 Encounters:  08/17/21 107 lb (48.5 kg)  07/20/21 105 lb 6.4 oz (47.8 kg)  07/10/21 107 lb (48.5 kg)        Relevant Orders   CBC with Differential/Platelet   Comprehensive metabolic panel      Meds ordered this encounter  Medications   fluticasone (FLONASE) 50 MCG/ACT nasal spray    Sig: Place 2 sprays into both nostrils daily as needed for allergies.    Dispense:  16 g    Refill:  3   nadolol (CORGARD) 40 MG tablet    Sig: Take 0.5 tablets (20 mg total) by mouth 2 (two) times daily.    Dispense:  90 tablet    Refill:  3      Follow-up: Return in about 6 months (around 02/17/2022) for Wellness Exam.  Sonda Primes, MD

## 2021-08-17 NOTE — Patient Instructions (Addendum)
Sign up for [employer] ( via [employer] on your phone or your ipad). If you don't have a Art therapist card  - go to [employer] branch. They will set you up in 15 minutes. It is free. You can check out books to read and to listen, check out magazines and newspapers, movies etc. ? ? ?Alphonse Guild books ? ? ?--------------------------------------------------------------------------------------------------------------------------------------------------------- ? 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 He completed his undergraduate degree at the [employer]. He received his medical degree from [employer] in [place] in 2002. 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 Dr. Talbert Forest is fluent in [place].  ? ?Dr. 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 He is also a member of the [employer].  ? ?Dr. Talbert Forest and his wife are actively involved in church activities. They have two girls that keep them very busy. In his limited free time, he enjoys golf, fishing, various outdoor activities, and spending time with his dog, Dexter.  ? ?Education ? ?[employer] Degree, 2002 ?[employer], 2003 ?[employer] [place], [place] - Ophthalmology Residency, 2004-2005 ?Dr. Talbert Forest believes that every patient deserves to be treated with respect and dignity, and afforded the most  effective, personalized vision treatments available. To schedule a consultation, please call [phone] today. ?

## 2021-08-17 NOTE — Assessment & Plan Note (Signed)
Wt Readings from Last 3 Encounters:  ?08/17/21 107 lb (48.5 kg)  ?07/20/21 105 lb 6.4 oz (47.8 kg)  ?07/10/21 107 lb (48.5 kg)  ? ? ?

## 2021-08-17 NOTE — Assessment & Plan Note (Signed)
F/u Dr Vaughan Browner ?On a percussive vest daily ?Off abx ?

## 2021-08-17 NOTE — Assessment & Plan Note (Signed)
F/u Dr Vaughan Browner ?On a percussive vest daily ?

## 2021-08-17 NOTE — Assessment & Plan Note (Signed)
Borderline low B12 ?Cont on B12 ?

## 2021-09-03 ENCOUNTER — Other Ambulatory Visit: Payer: Self-pay | Admitting: *Deleted

## 2021-09-17 ENCOUNTER — Other Ambulatory Visit: Payer: Self-pay | Admitting: *Deleted

## 2021-09-17 NOTE — Telephone Encounter (Signed)
Flonase already done 08/17/21.Marland KitchenJohny Schultz

## 2021-11-18 ENCOUNTER — Other Ambulatory Visit: Payer: Self-pay | Admitting: *Deleted

## 2021-11-18 MED ORDER — CALCITONIN (SALMON) 200 UNIT/ACT NA SOLN: 1.0000 | Freq: Every day | NASAL | 5 refills | Status: DC

## 2022-01-07 ENCOUNTER — Other Ambulatory Visit: Payer: Self-pay | Admitting: Internal Medicine

## 2022-01-07 DIAGNOSIS — Z1231 Encounter for screening mammogram for malignant neoplasm of breast: Secondary | ICD-10-CM

## 2022-01-20 ENCOUNTER — Other Ambulatory Visit: Payer: Self-pay | Admitting: *Deleted

## 2022-01-20 MED ORDER — FLUTICASONE PROPIONATE 50 MCG/ACT NA SUSP
2.0000 | Freq: Every day | NASAL | 3 refills | Status: DC | PRN
Start: 2022-01-20 — End: 2022-02-17

## 2022-02-17 ENCOUNTER — Encounter: Payer: Self-pay | Admitting: Internal Medicine

## 2022-02-17 ENCOUNTER — Ambulatory Visit
Admission: RE | Admit: 2022-02-17 | Discharge: 2022-02-17 | Disposition: A | Discharge status: Home / Self Care | Payer: Medicare Other | Source: Ambulatory Visit | Attending: Internal Medicine | Admitting: Internal Medicine

## 2022-02-17 ENCOUNTER — Ambulatory Visit (INDEPENDENT_AMBULATORY_CARE_PROVIDER_SITE_OTHER): Payer: Medicare Other | Admitting: Internal Medicine

## 2022-02-17 VITALS — BP 128/76 | HR 69 | Temp 98.1°F | Ht 63.0 in | Wt 108.8 lb

## 2022-02-17 DIAGNOSIS — H6123 Impacted cerumen, bilateral: Secondary | ICD-10-CM

## 2022-02-17 DIAGNOSIS — E538 Deficiency of other specified B group vitamins: Secondary | ICD-10-CM

## 2022-02-17 DIAGNOSIS — R519 Headache, unspecified: Secondary | ICD-10-CM

## 2022-02-17 DIAGNOSIS — Z1231 Encounter for screening mammogram for malignant neoplasm of breast: Secondary | ICD-10-CM

## 2022-02-17 DIAGNOSIS — Z23 Encounter for immunization: Secondary | ICD-10-CM

## 2022-02-17 DIAGNOSIS — I10 Essential (primary) hypertension: Secondary | ICD-10-CM | POA: Diagnosis not present

## 2022-02-17 DIAGNOSIS — H04123 Dry eye syndrome of bilateral lacrimal glands: Secondary | ICD-10-CM

## 2022-02-17 DIAGNOSIS — R682 Dry mouth, unspecified: Secondary | ICD-10-CM

## 2022-02-17 DIAGNOSIS — G8929 Other chronic pain: Secondary | ICD-10-CM

## 2022-02-17 LAB — COMPREHENSIVE METABOLIC PANEL
ALT: 13 U/L (ref 0–35)
AST: 22 U/L (ref 0–37)
Albumin: 4.6 g/dL (ref 3.5–5.2)
Alkaline Phosphatase: 100 U/L (ref 39–117)
BUN: 11 mg/dL (ref 6–23)
CO2: 34 mEq/L — ABNORMAL HIGH (ref 19–32)
Calcium: 9.8 mg/dL (ref 8.4–10.5)
Chloride: 102 mEq/L (ref 96–112)
Creatinine, Ser: 0.64 mg/dL (ref 0.40–1.20)
GFR: 89.28 mL/min (ref >60.00)
Glucose, Bld: 163 mg/dL — ABNORMAL HIGH (ref 70–99)
Potassium: 3.5 mEq/L (ref 3.5–5.1)
Sodium: 141 mEq/L (ref 135–145)
Total Bilirubin: 0.4 mg/dL (ref 0.2–1.2)
Total Protein: 7.3 g/dL (ref 6.0–8.3)

## 2022-02-17 LAB — CBC WITH DIFFERENTIAL/PLATELET
Basophils Absolute: 0 10*3/uL (ref 0.0–0.1)
Basophils Relative: 0.6 % (ref 0.0–3.0)
Eosinophils Absolute: 0.1 10*3/uL (ref 0.0–0.7)
Eosinophils Relative: 1.7 % (ref 0.0–5.0)
HCT: 38.1 % (ref 36.0–46.0)
Hemoglobin: 12.8 g/dL (ref 12.0–15.0)
Lymphocytes Relative: 21.7 % (ref 12.0–46.0)
Lymphs Abs: 1.5 10*3/uL (ref 0.7–4.0)
MCHC: 33.5 g/dL (ref 30.0–36.0)
MCV: 90.7 fl (ref 78.0–100.0)
Monocytes Absolute: 0.5 10*3/uL (ref 0.1–1.0)
Monocytes Relative: 7 % (ref 3.0–12.0)
Neutro Abs: 4.7 10*3/uL (ref 1.4–7.7)
Neutrophils Relative %: 69 % (ref 43.0–77.0)
Platelets: 206 10*3/uL (ref 150.0–400.0)
RBC: 4.2 Mil/uL (ref 3.87–5.11)
RDW: 13.4 % (ref 11.5–15.5)
WBC: 6.9 10*3/uL (ref 4.0–10.5)

## 2022-02-17 LAB — URINALYSIS
Bilirubin Urine: NEGATIVE
Ketones, ur: NEGATIVE
Leukocytes,Ua: NEGATIVE
Nitrite: NEGATIVE
Specific Gravity, Urine: >=1.03 — AB (ref 1.000–1.030)
Urine Glucose: NEGATIVE
Urobilinogen, UA: 0.2 (ref 0.0–1.0)
pH: 5.5 (ref 5.0–8.0)

## 2022-02-17 LAB — SEDIMENTATION RATE: Sed Rate: 17 mm/hr (ref 0–30)

## 2022-02-17 LAB — TSH: TSH: 1.36 u[IU]/mL (ref 0.35–5.50)

## 2022-02-17 MED ORDER — PANTOPRAZOLE SODIUM 40 MG PO TBEC: 40.0000 mg | DELAYED_RELEASE_TABLET | Freq: Two times a day (BID) | ORAL | 3 refills | Status: DC

## 2022-02-17 MED ORDER — FLUTICASONE PROPIONATE 50 MCG/ACT NA SUSP: 2.0000 | Freq: Every day | NASAL | 3 refills | Status: DC | PRN

## 2022-02-17 MED ORDER — AMLODIPINE BESYLATE 2.5 MG PO TABS: 2.5000 mg | ORAL_TABLET | Freq: Every day | ORAL | 3 refills | Status: DC

## 2022-02-17 MED ORDER — BUTALBITAL-APAP-CAFFEINE 50-325-40 MG PO TABS: 1.0000 | ORAL_TABLET | Freq: Four times a day (QID) | ORAL | 1 refills | Status: AC | PRN

## 2022-02-17 NOTE — Assessment & Plan Note (Signed)
Cont on Nadolol, Amlodipine

## 2022-02-17 NOTE — Assessment & Plan Note (Signed)
Fioricet prn rare use

## 2022-02-17 NOTE — Assessment & Plan Note (Signed)
Use wax removing kit

## 2022-02-17 NOTE — Progress Notes (Signed)
Subjective:  Patient ID: Lindsay Schultz, female    DOB: 04-19-1951  Age: 71 y.o. MRN: [id number]  CC: wellness exam   HPI Lindsay Schultz presents for HTN, GERD C/o dry eyes and dry mouth C/o R ear discomfort - pinning sensation  Outpatient Medications Prior to Visit  Medication Sig Dispense Refill   acetaminophen (TYLENOL) 500 MG tablet Take 500-1,000 mg by mouth every 6 (six) hours as needed (for pain.).     albuterol (VENTOLIN HFA) 108 (90 Base) MCG/ACT inhaler inhale 2 puffs EVERY 6 HOURS AS NEEDED wheezing & SHORTNESS OF BREATH 18 g 11   calcitonin, salmon, (MIACALCIN/FORTICAL) 200 UNIT/ACT nasal spray Place 1 spray into alternate nostrils daily. 3.7 mL 5   Carboxymethylcellulose Sodium 0.25 % SOLN Place 1 drop into both eyes 3 (three) times daily as needed (for dry/irritated eyes.).     cholecalciferol (VITAMIN D) 1000 units tablet Take 1,000 Units by mouth 2 (two) times daily.     CRANBERRY PO Take 1 capsule by mouth every evening.     Cyanocobalamin (VITAMIN B-12) 500 MCG SUBL 1 tab sl qd (Patient taking differently: Take 500 mcg by mouth 2 (two) times daily.) 100 tablet 11   famotidine (PEPCID) 20 MG tablet Take 20 mg by mouth daily.     nadolol (CORGARD) 40 MG tablet Take 0.5 tablets (20 mg total) by mouth 2 (two) times daily. 90 tablet 3   amLODipine (NORVASC) 2.5 MG tablet TAKE 1 TABLET BY MOUTH EVERY DAY 90 tablet 3   butalbital-acetaminophen-caffeine (FIORICET) 50-325-40 MG tablet Take 1 tablet by mouth every 6 (six) hours as needed for headache. 60 tablet 1   fluticasone (FLONASE) 50 MCG/ACT nasal spray Place 2 sprays into both nostrils daily as needed for allergies. 16 g 3   pantoprazole (PROTONIX) 40 MG tablet Take 1 tablet (40 mg total) by mouth 2 (two) times daily. 90 tablet 7   sodium chloride HYPERTONIC (NEBUSAL) 3 % nebulizer solution USE 1 VIAL IN THE NEBULIZER IN THE MORNING AND USE 1 VIAL IN THE NEBULIZER AT BEDTIME (Patient not taking: Reported on 02/17/2022)  240 mL 11   No facility-administered medications prior to visit.    ROS: Review of Systems  Constitutional:  Negative for activity change, appetite change, chills, fatigue and unexpected weight change.  HENT:  Negative for congestion, mouth sores and sinus pressure.   Eyes:  Negative for visual disturbance.  Respiratory:  Negative for cough and chest tightness.   Gastrointestinal:  Negative for abdominal pain and nausea.  Genitourinary:  Negative for difficulty urinating, frequency and vaginal pain.  Musculoskeletal:  Negative for back pain and gait problem.  Skin:  Negative for pallor and rash.  Neurological:  Negative for dizziness, tremors, weakness, numbness and headaches.  Psychiatric/Behavioral:  Negative for confusion and sleep disturbance.     Objective:  BP 128/76 (BP Location: Left Arm)   Pulse 69   Temp 98.1 F (36.7 C) (Oral)   Ht _0  (1.6 m)   Wt 108 lb 12.8 oz (49.4 kg)   SpO2 95%   BMI 19.27 kg/m   BP Readings from Last 3 Encounters:  02/17/22 128/76  08/17/21 136/80  07/20/21 120/66    Wt Readings from Last 3 Encounters:  02/17/22 108 lb 12.8 oz (49.4 kg)  08/17/21 107 lb (48.5 kg)  07/20/21 105 lb 6.4 oz (47.8 kg)    Physical Exam Constitutional:      General: She is not in acute distress.  Appearance: Normal appearance. She is well-developed.  HENT:     Head: Normocephalic.     Right Ear: External ear normal.     Left Ear: External ear normal.     Nose: Nose normal.  Eyes:     General:        Right eye: No discharge.        Left eye: No discharge.     Conjunctiva/sclera: Conjunctivae normal.     Pupils: Pupils are equal, round, and reactive to light.  Neck:     Thyroid: No thyromegaly.     Vascular: No JVD.     Trachea: No tracheal deviation.  Cardiovascular:     Rate and Rhythm: Normal rate and regular rhythm.     Heart sounds: Normal heart sounds.  Pulmonary:     Effort: No respiratory distress.     Breath sounds: No stridor.  No wheezing.  Abdominal:     General: Bowel sounds are normal. There is no distension.     Palpations: Abdomen is soft. There is no mass.     Tenderness: There is no abdominal tenderness. There is no guarding or rebound.  Musculoskeletal:        General: No tenderness.     Cervical back: Normal range of motion and neck supple. No rigidity.  Lymphadenopathy:     Cervical: No cervical adenopathy.  Skin:    Findings: No erythema or rash.  Neurological:     Cranial Nerves: No cranial nerve deficit.     Motor: No abnormal muscle tone.     Coordination: Coordination normal.     Deep Tendon Reflexes: Reflexes normal.  Psychiatric:        Behavior: Behavior normal.        Thought Content: Thought content normal.        Judgment: Judgment normal.   Wax B  Lab Results  Component Value Date   WBC 5.9 08/17/2021   HGB 12.8 08/17/2021   HCT 38.2 08/17/2021   PLT 182.0 08/17/2021   GLUCOSE 89 08/17/2021   CHOL 202 (H) 02/16/2021   TRIG 135.0 02/16/2021   HDL 56.50 02/16/2021   LDLDIRECT 140.0 12/13/2017   LDLCALC 119 (H) 02/16/2021   ALT 12 08/17/2021   AST 20 08/17/2021   NA 142 08/17/2021   K 3.8 08/17/2021   CL 103 08/17/2021   CREATININE 0.81 08/17/2021   BUN 10 08/17/2021   CO2 32 08/17/2021   TSH 1.08 02/16/2021   INR 1.05 01/26/2016    No results found.  Assessment & Plan:   Problem List Items Addressed This Visit     B12 deficiency    On B12 Check B12      CERUMEN IMPACTION    Use wax removing kit      Essential hypertension    Cont on Nadolol, Amlodipine      Relevant Medications   amLODipine (NORVASC) 2.5 MG tablet   Other Relevant Orders   TSH   CBC with Differential/Platelet   Comprehensive metabolic panel   Urinalysis   Headache    Fioricet prn rare use      Relevant Medications   amLODipine (NORVASC) 2.5 MG tablet   butalbital-acetaminophen-caffeine (FIORICET) 50-325-40 MG tablet   Other Relevant Orders   TSH   CBC with  Differential/Platelet   Comprehensive metabolic panel   Urinalysis   Other Visit Diagnoses     Dry mouth and eyes    -  Primary   Relevant Orders  Sjogren's syndrome antibods(ssa + ssb)   ANA   Sedimentation rate   TSH   CBC with Differential/Platelet   Comprehensive metabolic panel   Urinalysis   Needs flu shot       Relevant Orders   Flu Vaccine QUAD High Dose(Fluad) (Completed)         Meds ordered this encounter  Medications   fluticasone (FLONASE) 50 MCG/ACT nasal spray    Sig: Place 2 sprays into both nostrils daily as needed for allergies.    Dispense:  16 g    Refill:  3   amLODipine (NORVASC) 2.5 MG tablet    Sig: Take 1 tablet (2.5 mg total) by mouth daily.    Dispense:  90 tablet    Refill:  3   pantoprazole (PROTONIX) 40 MG tablet    Sig: Take 1 tablet (40 mg total) by mouth 2 (two) times daily.    Dispense:  90 tablet    Refill:  3   butalbital-acetaminophen-caffeine (FIORICET) 50-325-40 MG tablet    Sig: Take 1 tablet by mouth every 6 (six) hours as needed for headache.    Dispense:  60 tablet    Refill:  1      Follow-up: Return in about 3 months (around 05/20/2022) for Wellness Exam.  Walker Kehr, MD

## 2022-02-17 NOTE — Patient Instructions (Signed)
Use wax removing kit

## 2022-02-17 NOTE — Assessment & Plan Note (Signed)
On B12 Check B12

## 2022-02-20 LAB — SJOGREN'S SYNDROME ANTIBODS(SSA + SSB)
SSA (Ro) (ENA) Antibody, IgG: <1 AI
SSB (La) (ENA) Antibody, IgG: <1 AI

## 2022-02-20 LAB — ANA: Anti Nuclear Antibody (ANA): NEGATIVE

## 2022-05-24 ENCOUNTER — Other Ambulatory Visit: Payer: Self-pay | Admitting: *Deleted

## 2022-05-24 MED ORDER — FLUTICASONE PROPIONATE 50 MCG/ACT NA SUSP: 2.0000 | Freq: Every day | NASAL | 3 refills | Status: DC | PRN

## 2022-05-24 MED ORDER — CALCITONIN (SALMON) 200 UNIT/ACT NA SOLN: 1.0000 | Freq: Every day | NASAL | 5 refills | Status: DC

## 2022-07-02 ENCOUNTER — Ambulatory Visit (HOSPITAL_COMMUNITY)
Admission: RE | Admit: 2022-07-02 | Discharge: 2022-07-02 | Disposition: A | Discharge status: Home / Self Care | Payer: Medicare Other | Source: Ambulatory Visit | Attending: Pulmonary Disease | Admitting: Pulmonary Disease

## 2022-07-02 DIAGNOSIS — J479 Bronchiectasis, uncomplicated: Secondary | ICD-10-CM | POA: Diagnosis present

## 2022-07-12 ENCOUNTER — Ambulatory Visit (INDEPENDENT_AMBULATORY_CARE_PROVIDER_SITE_OTHER): Payer: Medicare Other

## 2022-07-12 VITALS — Ht 63.0 in | Wt 108.0 lb

## 2022-07-12 DIAGNOSIS — Z0000 Encounter for general adult medical examination without abnormal findings: Secondary | ICD-10-CM | POA: Diagnosis not present

## 2022-07-12 NOTE — Progress Notes (Addendum)
I connected with  Lindsay Schultz on 07/12/22 by a audio enabled telemedicine application and verified that I am speaking with the correct person using two identifiers.  Patient Location: Home  Provider Location: Office/Clinic  I discussed the limitations of evaluation and management by telemedicine. The patient expressed understanding and agreed to proceed.  Subjective:   Lindsay Schultz is a 72 y.o. female who presents for Medicare Annual (Subsequent) preventive examination.  Review of Systems     Cardiac Risk Factors include: advanced age (>29men, >61 women);family history of premature cardiovascular disease;hypertension     Objective:    Today's Vitals   07/12/22 1033  Weight: 108 lb (49 kg)  Height: 5\' 3"  (1.6 m)  PainSc: 0-No pain   Body mass index is 19.13 kg/m.     07/12/2022   10:34 AM 07/10/2021    2:37 PM 11/02/2020   10:26 AM 07/01/2020    2:12 PM 02/21/2018    8:26 AM 12/13/2016    2:19 PM 01/24/2016    2:00 PM  Advanced Directives  Does Patient Have a Medical Advance Directive? Yes Yes Yes Yes Yes Yes No  Type of Paramedic of [place];Living will [place];Living will Healthcare Power of Attorney Living will;Healthcare Power of [place];Living will   Does patient want to make changes to medical advance directive?    No - Patient declined     Copy of [place] in Chart? No - copy requested No - copy requested  No - copy requested No - copy requested No - copy requested   Would patient like information on creating a medical advance directive?       No - patient declined information    Current Medications (verified) Outpatient Encounter Medications as of 07/12/2022  Medication Sig   acetaminophen (TYLENOL) 500 MG tablet Take 500-1,000 mg by mouth every 6 (six) hours as needed (for pain.).   albuterol (VENTOLIN HFA) 108 (90 Base) MCG/ACT  inhaler inhale 2 puffs EVERY 6 HOURS AS NEEDED wheezing & SHORTNESS OF BREATH   amLODipine (NORVASC) 2.5 MG tablet Take 1 tablet (2.5 mg total) by mouth daily.   butalbital-acetaminophen-caffeine (FIORICET) 50-325-40 MG tablet Take 1 tablet by mouth every 6 (six) hours as needed for headache.   calcitonin, salmon, (MIACALCIN/FORTICAL) 200 UNIT/ACT nasal spray Place 1 spray into alternate nostrils daily.   Carboxymethylcellulose Sodium 0.25 % SOLN Place 1 drop into both eyes 3 (three) times daily as needed (for dry/irritated eyes.).   cholecalciferol (VITAMIN D) 1000 units tablet Take 1,000 Units by mouth 2 (two) times daily.   CRANBERRY PO Take 1 capsule by mouth every evening.   Cyanocobalamin (VITAMIN B-12) 500 MCG SUBL 1 tab sl qd (Patient taking differently: Take 500 mcg by mouth 2 (two) times daily.)   famotidine (PEPCID) 20 MG tablet Take 20 mg by mouth daily.   fluticasone (FLONASE) 50 MCG/ACT nasal spray Place 2 sprays into both nostrils daily as needed for allergies.   nadolol (CORGARD) 40 MG tablet Take 0.5 tablets (20 mg total) by mouth 2 (two) times daily.   pantoprazole (PROTONIX) 40 MG tablet Take 1 tablet (40 mg total) by mouth 2 (two) times daily.   No facility-administered encounter medications on file as of 07/12/2022.    Allergies (verified) Meloxicam, Paroxetine, Penicillins, Zanaflex [tizanidine hcl], Ciprofloxacin, Clarithromycin, and Zithromax [azithromycin]   History: Past Medical History:  Diagnosis Date   Allergic rhinitis  Benign paroxysmal positional vertigo 06/14/2015   Breast cancer ([place]) 2008   Bronchiectasis ([place])    GERD (gastroesophageal reflux disease)    HTN (hypertension)    Hx of breast cancer    Osteopenia    Personal history of radiation therapy 2008   Past Surgical History:  Procedure Laterality Date   ABDOMINAL HYSTERECTOMY     APPENDECTOMY     BREAST LUMPECTOMY  2008   Left   NASAL SINUS SURGERY  1997   VIDEO BRONCHOSCOPY Bilateral  02/21/2018   Procedure: VIDEO BRONCHOSCOPY WITHOUT FLUORO;  Surgeon: Marshell Garfinkel, MD;  Location: [facility];  Service: Cardiopulmonary;  Laterality: Bilateral;   Family History  Problem Relation Age of Onset   Sjogren's syndrome Mother    Arthritis Father 72       RA   Emphysema Father    Heart disease Father    Hodgkin's lymphoma Brother    Hypertension Other    Breast cancer Neg Hx    Social History   Socioeconomic History   Marital status: Married    Spouse name: Not on file   Number of children: Not on file   Years of education: Not on file   Highest education level: Not on file  Occupational History   Occupation: Retired    Comment: Orthoptist  Tobacco Use   Smoking status: Never   Smokeless tobacco: Never  Vaping Use   Vaping Use: Never used  Substance and Sexual Activity   Alcohol use: No    Alcohol/week: 0.0 standard drinks of alcohol   Drug use: No   Sexual activity: Yes  Other Topics Concern   Not on file  Social History Narrative   Regular Exercise- yes   Social Determinants of Health   Financial Resource Strain: Low Risk  (07/12/2022)   Overall Financial Resource Strain (CARDIA)    Difficulty of Paying Living Expenses: Not hard at all  Food Insecurity: No Food Insecurity (07/12/2022)   Hunger Vital Sign    Worried About Running Out of Food in the Last Year: Never true    [place] in the Last Year: Never true  Transportation Needs: No Transportation Needs (07/12/2022)   PRAPARE - Hydrologist (Medical): No    Lack of Transportation (Non-Medical): No  Physical Activity: Sufficiently Active (07/12/2022)   Exercise Vital Sign    Days of Exercise per Week: 7 days    Minutes of Exercise per Session: 30 min  Stress: No Stress Concern Present (07/12/2022)   [place]    Feeling of Stress : Not at all  Social Connections: [place]  (07/12/2022)   Social Connection and Isolation Panel [NHANES]    Frequency of Communication with Friends and Family: More than three times a week    Frequency of Social Gatherings with Friends and Family: More than three times a week    Attends Religious Services: More than 4 times per year    Active Member of [employer] or Organizations: Yes    Attends Music therapist: More than 4 times per year    Marital Status: Married    Tobacco Counseling Counseling given: Not Answered   Clinical Intake:  Pre-visit preparation completed: Yes  Pain : No/denies pain Pain Score: 0-No pain     BMI - recorded: 19.13 Nutritional Status: BMI of 19-24  Normal Nutritional Risks: None Diabetes: No  How often do you need  to have someone help you when you read instructions, pamphlets, or other written materials from your doctor or pharmacy?: 1 - Never What is the last grade level you completed in school?: HSG  Diabetic? No  Interpreter Needed?: No  Information entered by :: Lisette Abu, LPN.   Activities of Daily Living    07/12/2022   10:46 AM  In your present state of health, do you have any difficulty performing the following activities:  Hearing? 0  Vision? 0  Difficulty concentrating or making decisions? 0  Walking or climbing stairs? 0  Dressing or bathing? 0  Doing errands, shopping? 0  Preparing Food and eating ? N  Using the Toilet? N  In the past six months, have you accidently leaked urine? N  Do you have problems with loss of bowel control? N  Managing your Medications? N  Managing your Finances? N  Housekeeping or managing your Housekeeping? N    Patient Care Team: Plotnikov, Evie Lacks, MD as PCP - General Marshell Garfinkel, MD as Consulting Physician (Pulmonary Disease) Ladene Artist, MD as Consulting Physician (Gastroenterology) Lynnell Dike, OD (Optometry)  Indicate any recent Medical Services you may have received from other than [facility] providers  in the past year (date may be approximate).     Assessment:   This is a routine wellness examination for Lindsay Schultz.  Hearing/Vision screen Hearing Screening - Comments:: Denies hearing difficulties   Vision Screening - Comments:: Wears rx glasses - up to date with routine eye exams with Cleon Gustin, OD.  Dietary issues and exercise activities discussed: Current Exercise Habits: Home exercise routine, Type of exercise: walking;Other - see comments, Time (Minutes): 30, Frequency (Times/Week): 7, Weekly Exercise (Minutes/Week): 210, Intensity: Moderate, Exercise limited by: None identified   Goals Addressed   None   Depression Screen    07/12/2022   10:42 AM 02/17/2022    1:31 PM 07/10/2021    2:31 PM 05/07/2020   11:36 AM 10/01/2019    1:41 PM 02/05/2019    2:39 PM 04/26/2018    3:36 PM  PHQ 2/9 Scores  PHQ - 2 Score 0 0 0 0 0 0 0  PHQ- 9 Score 0 1         Fall Risk    07/12/2022   10:35 AM 02/17/2022    1:30 PM 07/10/2021    2:29 PM 07/01/2020    2:14 PM 05/07/2020    2:13 PM  [place] in the past year? 0 0 0 0 0  Number falls in past yr: 0 0 0 0 0  Injury with Fall? 0 0 0 0 0  Risk for fall due to : No Fall Risks No Fall Risks No Fall Risks No Fall Risks   Follow up Falls prevention discussed  Falls prevention discussed      FALL RISK PREVENTION PERTAINING TO THE HOME:  Any stairs in or around the home? Yes  If so, are there any without handrails? No  Home free of loose throw rugs in walkways, pet beds, electrical cords, etc? Yes  Adequate lighting in your home to reduce risk of falls? Yes   ASSISTIVE DEVICES UTILIZED TO PREVENT FALLS:  Life alert? No  Use of a cane, walker or w/c? No  Grab bars in the bathroom? Yes  Shower chair or bench in shower? Yes  Elevated toilet seat or a handicapped toilet? No   TIMED UP AND GO:  Was the test performed? No . Telephonic  Visit  Cognitive Function:        07/12/2022   10:46 AM 07/10/2021    2:37 PM  6CIT Screen   What Year? 0 points 0 points  What month? 0 points 0 points  What time? 0 points 0 points  Count back from 20 0 points 0 points  Months in reverse 0 points 0 points  Repeat phrase 0 points 0 points  Total Score 0 points 0 points    Immunizations Immunization History  Administered Date(s) Administered   Fluad Quad(high Dose 65+) 02/16/2021, 02/17/2022   Influenza Split 02/15/2012, 02/13/2015   Influenza Whole 02/02/2008, 01/29/2009, 12/12/2009   Influenza, High Dose Seasonal PF 12/13/2016, 01/18/2019   Influenza,inj,Quad PF,6+ Mos 02/23/2013, 02/25/2014, 02/02/2016, 12/13/2017   Influenza,inj,quad, With Preservative 03/21/2020   PFIZER(Purple Top)SARS-COV-2 Vaccination 06/12/2019, 07/02/2019   Pneumococcal Conjugate-13 11/07/2015   Pneumococcal Polysaccharide-23 05/28/2014, 05/07/2020   Tdap 09/26/2012    TDAP status: Up to date  Flu Vaccine status: Up to date  Pneumococcal vaccine status: Up to date  Covid-19 vaccine status: Completed vaccines  Qualifies for Shingles Vaccine? Yes   Zostavax completed No   Shingrix Completed?: No.    Education has been provided regarding the importance of this vaccine. Patient has been advised to call insurance company to determine out of pocket expense if they have not yet received this vaccine. Advised may also receive vaccine at local pharmacy or Health Dept. Verbalized acceptance and understanding.  Screening Tests Health Maintenance  Topic Date Due   Zoster Vaccines- Shingrix (1 of 2) Never done   DEXA SCAN  11/12/2017   COVID-19 Vaccine (3 - Pfizer risk series) 07/30/2019   DTaP/Tdap/Td (2 - Td or Tdap) 09/27/2022   MAMMOGRAM  02/18/2023   Medicare Annual Wellness (AWV)  07/12/2023   COLONOSCOPY (Pts 45-79yrs Insurance coverage will need to be confirmed)  01/13/2031   Pneumonia Vaccine 53+ Years old  Completed   INFLUENZA VACCINE  Completed   Hepatitis C Screening  Completed   HPV VACCINES  Aged Out   Fecal DNA (Cologuard)   Discontinued    Health Maintenance  Health Maintenance Due  Topic Date Due   Zoster Vaccines- Shingrix (1 of 2) Never done   DEXA SCAN  11/12/2017   COVID-19 Vaccine (3 - Pfizer risk series) 07/30/2019    Colorectal cancer screening: Type of screening: Colonoscopy. Completed 01/12/2021. Repeat every 10 years  Mammogram status: Completed 02/17/2022. Repeat every year  Bone Density status: Completed 11/13/2015. Results reflect: Bone density results: OSTEOPOROSIS. Repeat every 2 years.  Lung Cancer Screening: (Low Dose CT Chest recommended if Age 49-80 years, 30 pack-year currently smoking OR have quit w/in 15years.) does not qualify.   Lung Cancer Screening Referral: no  Additional Screening:  Hepatitis C Screening: does qualify; Completed 11/07/2015  Vision Screening: Recommended annual ophthalmology exams for early detection of glaucoma and other disorders of the eye. Is the patient up to date with their annual eye exam?  Yes  Who is the provider or what is the name of the office in which the patient attends annual eye exams? [facility], OD. If pt is not established with a provider, would they like to be referred to a provider to establish care? No .   Dental Screening: Recommended annual dental exams for proper oral hygiene  Community Resource Referral / Chronic Care Management: CRR required this visit?  No   CCM required this visit?  No      Plan:  I have personally reviewed and noted the following in the patient's chart:   Medical and social history Use of alcohol, tobacco or illicit drugs  Current medications and supplements including opioid prescriptions. Patient is not currently taking opioid prescriptions. Functional ability and status Nutritional status Physical activity Advanced directives List of other physicians Hospitalizations, surgeries, and ER visits in previous 12 months Vitals Screenings to include cognitive, depression, and falls Referrals and  appointments  In addition, I have reviewed and discussed with patient certain preventive protocols, quality metrics, and best practice recommendations. A written personalized care plan for preventive services as well as general preventive health recommendations were provided to patient.     Sheral Flow, LPN   [id number]   Nurse Notes: Normal cognitive status assessed by direct observation by this Nurse Health Advisor. No abnormalities found.      Medical screening examination/treatment/procedure(s) were performed by non-physician practitioner and as supervising physician I was immediately available for consultation/collaboration.  I agree with above. Lew Dawes, MD

## 2022-07-12 NOTE — Patient Instructions (Addendum)
Lindsay Schultz , Thank you for taking time to come for your Medicare Wellness Visit. I appreciate your ongoing commitment to your health goals. Please review the following plan we discussed and let me know if I can assist you in the future.   These are the goals we discussed:  Goals      Be as healthy as possible     Continue to walk and eat healthy. Stay active and singing in church. Working in yard. Crafting.         This is a list of the screening recommended for you and due dates:  Health Maintenance  Topic Date Due   Zoster (Shingles) Vaccine (1 of 2) Never done   DEXA scan (bone density measurement)  11/12/2017   COVID-19 Vaccine (3 - Pfizer risk series) 07/30/2019   DTaP/Tdap/Td vaccine (2 - Td or Tdap) 09/27/2022   Mammogram  02/18/2023   Medicare Annual Wellness Visit  07/12/2023   Colon Cancer Screening  01/13/2031   Pneumonia Vaccine  Completed   Flu Shot  Completed   Hepatitis C Screening: USPSTF Recommendation to screen - Ages 18-79 yo.  Completed   HPV Vaccine  Aged Out   Cologuard (Stool DNA test)  Discontinued    Advanced directives: Yes  Conditions/risks identified: Yes  Next appointment: Follow up in one year for your annual wellness visit.   Preventive Care 23 Years and Older, Female Preventive care refers to lifestyle choices and visits with your health care provider that can promote health and wellness. What does preventive care include? A yearly physical exam. This is also called an annual well check. Dental exams once or twice a year. Routine eye exams. Ask your health care provider how often you should have your eyes checked. Personal lifestyle choices, including: Daily care of your teeth and gums. Regular physical activity. Eating a healthy diet. Avoiding tobacco and drug use. Limiting alcohol use. Practicing safe sex. Taking low-dose aspirin every day. Taking vitamin and mineral supplements as recommended by your health care provider. What happens  during an annual well check? The services and screenings done by your health care provider during your annual well check will depend on your age, overall health, lifestyle risk factors, and family history of disease. Counseling  Your health care provider may ask you questions about your: Alcohol use. Tobacco use. Drug use. Emotional well-being. Home and relationship well-being. Sexual activity. Eating habits. History of falls. Memory and ability to understand (cognition). Work and work Statistician. Reproductive health. Screening  You may have the following tests or measurements: Height, weight, and BMI. Blood pressure. Lipid and cholesterol levels. These may be checked every 5 years, or more frequently if you are over 58 years old. Skin check. Lung cancer screening. You may have this screening every year starting at age 57 if you have a 30-pack-year history of smoking and currently smoke or have quit within the past 15 years. Fecal occult blood test (FOBT) of the stool. You may have this test every year starting at age 66. Flexible sigmoidoscopy or colonoscopy. You may have a sigmoidoscopy every 5 years or a colonoscopy every 10 years starting at age 32. Hepatitis C blood test. Hepatitis B blood test. Sexually transmitted disease (STD) testing. Diabetes screening. This is done by checking your blood sugar (glucose) after you have not eaten for a while (fasting). You may have this done every 1-3 years. Bone density scan. This is done to screen for osteoporosis. You may have this done starting  at age 21. Mammogram. This may be done every 1-2 years. Talk to your health care provider about how often you should have regular mammograms. Talk with your health care provider about your test results, treatment options, and if necessary, the need for more tests. Vaccines  Your health care provider may recommend certain vaccines, such as: Influenza vaccine. This is recommended every  year. Tetanus, diphtheria, and acellular pertussis (Tdap, Td) vaccine. You may need a Td booster every 10 years. Zoster vaccine. You may need this after age 22. Pneumococcal 13-valent conjugate (PCV13) vaccine. One dose is recommended after age 41. Pneumococcal polysaccharide (PPSV23) vaccine. One dose is recommended after age 15. Talk to your health care provider about which screenings and vaccines you need and how often you need them. This information is not intended to replace advice given to you by your health care provider. Make sure you discuss any questions you have with your health care provider. Document Released: 05/02/2015 Document Revised: 12/24/2015 Document Reviewed: 02/04/2015 Elsevier Interactive Patient Education  2017 [place] Prevention in the Home Falls can cause injuries. They can happen to people of all ages. There are many things you can do to make your home safe and to help prevent falls. What can I do on the outside of my home? Regularly fix the edges of walkways and driveways and fix any cracks. Remove anything that might make you trip as you walk through a door, such as a raised step or threshold. Trim any bushes or trees on the path to your home. Use bright outdoor lighting. Clear any walking paths of anything that might make someone trip, such as rocks or tools. Regularly check to see if handrails are loose or broken. Make sure that both sides of any steps have handrails. Any raised decks and porches should have guardrails on the edges. Have any leaves, snow, or ice cleared regularly. Use sand or salt on walking paths during winter. Clean up any spills in your garage right away. This includes oil or grease spills. What can I do in the bathroom? Use night lights. Install grab bars by the toilet and in the tub and shower. Do not use towel bars as grab bars. Use non-skid mats or decals in the tub or shower. If you need to sit down in the shower, use a  plastic, non-slip stool. Keep the floor dry. Clean up any water that spills on the floor as soon as it happens. Remove soap buildup in the tub or shower regularly. Attach bath mats securely with double-sided non-slip rug tape. Do not have throw rugs and other things on the floor that can make you trip. What can I do in the bedroom? Use night lights. Make sure that you have a light by your bed that is easy to reach. Do not use any sheets or blankets that are too big for your bed. They should not hang down onto the floor. Have a firm chair that has side arms. You can use this for support while you get dressed. Do not have throw rugs and other things on the floor that can make you trip. What can I do in the kitchen? Clean up any spills right away. Avoid walking on wet floors. Keep items that you use a lot in easy-to-reach places. If you need to reach something above you, use a strong step stool that has a grab bar. Keep electrical cords out of the [place]. Do not use floor polish or wax that makes  floors slippery. If you must use wax, use non-skid floor wax. Do not have throw rugs and other things on the floor that can make you trip. What can I do with my stairs? Do not leave any items on the stairs. Make sure that there are handrails on both sides of the stairs and use them. Fix handrails that are broken or loose. Make sure that handrails are as long as the stairways. Check any carpeting to make sure that it is firmly attached to the stairs. Fix any carpet that is loose or worn. Avoid having throw rugs at the top or bottom of the stairs. If you do have throw rugs, attach them to the floor with carpet tape. Make sure that you have a light switch at the top of the stairs and the bottom of the stairs. If you do not have them, ask someone to add them for you. What else can I do to help prevent falls? Wear shoes that: Do not have high heels. Have rubber bottoms. Are comfortable and fit you  well. Are closed at the toe. Do not wear sandals. If you use a stepladder: Make sure that it is fully opened. Do not climb a closed stepladder. Make sure that both sides of the stepladder are locked into place. Ask someone to hold it for you, if possible. Clearly mark and make sure that you can see: Any grab bars or handrails. First and last steps. Where the edge of each step is. Use tools that help you move around (mobility aids) if they are needed. These include: Canes. Walkers. Scooters. Crutches. Turn on the lights when you go into a dark area. Replace any light bulbs as soon as they burn out. Set up your furniture so you have a clear path. Avoid moving your furniture around. If any of your floors are uneven, fix them. If there are any pets around you, be aware of where they are. Review your medicines with your doctor. Some medicines can make you feel dizzy. This can increase your chance of falling. Ask your doctor what other things that you can do to help prevent falls. This information is not intended to replace advice given to you by your health care provider. Make sure you discuss any questions you have with your health care provider. Document Released: 01/30/2009 Document Revised: 09/11/2015 Document Reviewed: 05/10/2014 Elsevier Interactive Patient Education  2017 [employer].

## 2022-07-26 ENCOUNTER — Ambulatory Visit (INDEPENDENT_AMBULATORY_CARE_PROVIDER_SITE_OTHER): Payer: Medicare Other | Admitting: Pulmonary Disease

## 2022-07-26 ENCOUNTER — Encounter: Payer: Self-pay | Admitting: Pulmonary Disease

## 2022-07-26 VITALS — BP 124/70 | HR 54 | Temp 97.7°F | Ht 63.0 in | Wt 111.2 lb

## 2022-07-26 DIAGNOSIS — J479 Bronchiectasis, uncomplicated: Secondary | ICD-10-CM | POA: Diagnosis not present

## 2022-07-26 MED ORDER — ALBUTEROL SULFATE HFA 108 (90 BASE) MCG/ACT IN AERS: INHALATION_SPRAY | RESPIRATORY_TRACT | 11 refills | Status: DC

## 2022-07-26 MED ORDER — SODIUM CHLORIDE 3 % IN NEBU: INHALATION_SOLUTION | Freq: Two times a day (BID) | RESPIRATORY_TRACT | 12 refills | Status: DC

## 2022-07-26 NOTE — Progress Notes (Signed)
Lindsay Schultz    [id number]    03-Sep-1950  Primary Care Physician:Plotnikov, Georgina Quint, MD  Referring Physician: Tresa Garter, MD [address] [place],  [place] [address]  Chief complaint:   Follow up for Bronchiectasis with MAI infection Sinusitis  Seasonal allergies GERD  HPI: Lindsay Schultz is a 72 y.o.  female with history of asthmatic bronchitis, sinusitis, and seasonal allergies.   She's had a workup for her bronchiectasis including CBC, Ig levels, sputum cultures, A1AT levels, RF which were normal. PFTs do not show any obstruction and very mild restriction and reduction in diffusion capacity.   Underwent bronchoscopy on 12/22/2017 with cultures growing MAI She has been seen by ID and started on antibiotic therapy. Managed by Dr. Luciana Axe Completed 18 months of therapy.  Taken off antimicrobial therapy on June 2021 with intention to follow-up with CT. Per ID note if things worsen either clinically or radiographically + clinically, will consider restarting 3 drug therapy + three times weekly amikacin.    Had a mild bronchiectasis flare in January 2022 requiring prednisone for 5 days Finished pulmonary rehab in [place] in early 2022  She has attacks of bronchiectasis exacerbations about once a year. She states that the infection usually begins in the sinus and then moves into the lung. This usually resolves after course of antibiotics. She has H/O seasonal allergies (worst in fall and spring) and asthmatic bronchitis. Never smoked.   Interim history: She is here for review of CT scan States her breathing is stable without issue. Using percussion vest.  Saline nebs are on backorder  Outpatient Encounter Medications as of 07/26/2022  Medication Sig   acetaminophen (TYLENOL) 500 MG tablet Take 500-1,000 mg by mouth every 6 (six) hours as needed (for pain.).   albuterol (VENTOLIN HFA) 108 (90 Base) MCG/ACT inhaler inhale 2 puffs EVERY 6 HOURS AS NEEDED wheezing &  SHORTNESS OF BREATH   amLODipine (NORVASC) 2.5 MG tablet Take 1 tablet (2.5 mg total) by mouth daily.   butalbital-acetaminophen-caffeine (FIORICET) 50-325-40 MG tablet Take 1 tablet by mouth every 6 (six) hours as needed for headache.   calcitonin, salmon, (MIACALCIN/FORTICAL) 200 UNIT/ACT nasal spray Place 1 spray into alternate nostrils daily.   Carboxymethylcellulose Sodium 0.25 % SOLN Place 1 drop into both eyes 3 (three) times daily as needed (for dry/irritated eyes.).   cholecalciferol (VITAMIN D) 1000 units tablet Take 1,000 Units by mouth 2 (two) times daily.   CRANBERRY PO Take 1 capsule by mouth every evening.   Cyanocobalamin (VITAMIN B-12) 500 MCG SUBL 1 tab sl qd (Patient taking differently: Take 500 mcg by mouth 2 (two) times daily.)   fluticasone (FLONASE) 50 MCG/ACT nasal spray Place 2 sprays into both nostrils daily as needed for allergies.   nadolol (CORGARD) 40 MG tablet Take 0.5 tablets (20 mg total) by mouth 2 (two) times daily.   pantoprazole (PROTONIX) 40 MG tablet Take 1 tablet (40 mg total) by mouth 2 (two) times daily.   [DISCONTINUED] famotidine (PEPCID) 20 MG tablet Take 20 mg by mouth daily.   No facility-administered encounter medications on file as of 07/26/2022.   Physical Exam: Blood pressure 124/70, pulse (!) 54, temperature 97.7 F (36.5 C), temperature source Oral, height 5\' 3"  (1.6 m), weight 111 lb 3.2 oz (50.4 kg), SpO2 99 %. Gen:      No acute distress HEENT:  EOMI, sclera anicteric Neck:     No masses; no thyromegaly Lungs:  Clear to auscultation bilaterally; normal respiratory effort CV:         Regular rate and rhythm; no murmurs Abd:      + bowel sounds; soft, non-tender; no palpable masses, no distension Ext:    No edema; adequate peripheral perfusion Skin:      Warm and dry; no rash Neuro: alert and oriented x 3 Psych: normal mood and affect   Data Reviewed: Imaging CT scan 11/15/14-Multiple nodules in the left upper lobe, bilateral  bronchiectasis. CT scan 01/24/16 Stable pulmonary nodules, bilateral bronchiectasis. Left upper lobe ground glass opacities. Images reviewed. CT scan 11/16/2017- stable pulmonary nodules, bilateral bronchiectasis, new area of opacity in the right upper lobe consistent with mucus impaction.  CT chest 07/25/2019-stable bronchiectasis with mild increase in tree-in-bud appearance in the right upper lobe.  Stable pulmonary nodules. CT chest 01/29/2020-slight interval worsening of tree-in-bud appearance with nodular consolidation in the apical right upper lobe. CT chest 06/19/2020-increased nodularity of the lungs, bronchiectasis CT chest 06/22/2021-stable inflammatory changes in the lungs with tree-in-bud suggestive of chronic MAI. CT chest 07/02/2022-mild improvement in bronchiectasis, pulm nodules and mucous plugging. I reviewed the images personally.  Labs 12/09/14 IgG-773 IgG 8-2 37 IgM-64 IgE-56 Rheumatoid factor < 10 All within normal limits.   AFB and fungal cultures 11/19/14- no acid-fast bacilli, Candida albicans. 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 Repeat sputum AFB 01/05/2018-negative  Bronchoscopy 02/21/2018 Cultures- Mycobacterium avium complex BAL cell count-WBC 635, 9% lymphs, 1% eos, 76% neutrophils   PFTs  02/10/15 FVC 2.35 (74%), FEV1 1.79 (74%), F/F 77, TLC 79%, DLCO 78% No obstruction. Mild reduction in lung volumes and diffusion capacity  Assessment:  Bronchiectasis, MAC infection Off treatment since June 2021 after completing 18 months of therapy CT scan changes show slight improvement.  Discussed with Dr. Luciana Axe with ID.  We will continue to observe for now If she needs to go back on therapy then will need to use inhaled versus IV amikacin  Continue chest percussion with vest and Mucinex for mucociliary clearance. Hypertonic saline is on backorder  Asthmatic  Bronchitis She is asymptomatic with symptoms mostly during spring and fall allergy.  She'll continue the albuterol when necessary.  GERD Continue Zantac.  Health maintenance 02/07/2016-Prevnar 05/28/2014-Pneumovax  Up-to-date with flu and Covid.   Plan/Recommendations: - Continue albuterol as needed - Percussion vest, resume hypertonic saline when available  Chilton Greathouse MD [facility] 07/26/2022, 1:10 PM  CC: Plotnikov, Georgina Quint, MD

## 2022-07-26 NOTE — Addendum Note (Signed)
Addended by: Jacquiline Doe on: 07/26/2022 01:52 PM   Modules accepted: Orders

## 2022-07-26 NOTE — Patient Instructions (Signed)
His CT shows improvement in bronchiectasis which is good news Continue to use the percussion vest.  Will reorder the hypertonic saline nebulizers Follow-up in 1 year

## 2022-08-09 ENCOUNTER — Other Ambulatory Visit: Payer: Self-pay | Admitting: *Deleted

## 2022-08-09 MED ORDER — NADOLOL 40 MG PO TABS: 20.0000 mg | ORAL_TABLET | Freq: Two times a day (BID) | ORAL | 0 refills | Status: DC

## 2022-08-18 ENCOUNTER — Ambulatory Visit (INDEPENDENT_AMBULATORY_CARE_PROVIDER_SITE_OTHER): Payer: Medicare Other | Admitting: Internal Medicine

## 2022-08-18 ENCOUNTER — Encounter: Payer: Self-pay | Admitting: Internal Medicine

## 2022-08-18 VITALS — BP 122/80 | HR 55 | Temp 98.4°F | Ht 63.0 in | Wt 106.0 lb

## 2022-08-18 DIAGNOSIS — E538 Deficiency of other specified B group vitamins: Secondary | ICD-10-CM

## 2022-08-18 DIAGNOSIS — A310 Pulmonary mycobacterial infection: Secondary | ICD-10-CM

## 2022-08-18 DIAGNOSIS — Z0000 Encounter for general adult medical examination without abnormal findings: Secondary | ICD-10-CM | POA: Diagnosis not present

## 2022-08-18 DIAGNOSIS — R944 Abnormal results of kidney function studies: Secondary | ICD-10-CM

## 2022-08-18 LAB — VITAMIN B12: Vitamin B-12: 1020 pg/mL — ABNORMAL HIGH (ref 211–911)

## 2022-08-18 LAB — LIPID PANEL
Cholesterol: 210 mg/dL — ABNORMAL HIGH (ref 0–200)
HDL: 50.5 mg/dL (ref >39.00)
LDL Cholesterol: 126 mg/dL — ABNORMAL HIGH (ref 0–99)
NonHDL: 159.77
Total CHOL/HDL Ratio: 4
Triglycerides: 169 mg/dL — ABNORMAL HIGH (ref 0.0–149.0)
VLDL: 33.8 mg/dL (ref 0.0–40.0)

## 2022-08-18 LAB — COMPREHENSIVE METABOLIC PANEL
ALT: 17 U/L (ref 0–35)
AST: 26 U/L (ref 0–37)
Albumin: 4.6 g/dL (ref 3.5–5.2)
Alkaline Phosphatase: 91 U/L (ref 39–117)
BUN: 15 mg/dL (ref 6–23)
CO2: 28 mEq/L (ref 19–32)
Calcium: 9.8 mg/dL (ref 8.4–10.5)
Chloride: 100 mEq/L (ref 96–112)
Creatinine, Ser: 1.18 mg/dL (ref 0.40–1.20)
GFR: 46.53 mL/min — ABNORMAL LOW (ref >60.00)
Glucose, Bld: 87 mg/dL (ref 70–99)
Potassium: 4.4 mEq/L (ref 3.5–5.1)
Sodium: 137 mEq/L (ref 135–145)
Total Bilirubin: 0.3 mg/dL (ref 0.2–1.2)
Total Protein: 7.3 g/dL (ref 6.0–8.3)

## 2022-08-18 LAB — CBC WITH DIFFERENTIAL/PLATELET
Basophils Absolute: 0 10*3/uL (ref 0.0–0.1)
Basophils Relative: 0.6 % (ref 0.0–3.0)
Eosinophils Absolute: 0.1 10*3/uL (ref 0.0–0.7)
Eosinophils Relative: 1.5 % (ref 0.0–5.0)
HCT: 39.7 % (ref 36.0–46.0)
Hemoglobin: 13.2 g/dL (ref 12.0–15.0)
Lymphocytes Relative: 25.6 % (ref 12.0–46.0)
Lymphs Abs: 1.7 10*3/uL (ref 0.7–4.0)
MCHC: 33.2 g/dL (ref 30.0–36.0)
MCV: 90.9 fl (ref 78.0–100.0)
Monocytes Absolute: 0.4 10*3/uL (ref 0.1–1.0)
Monocytes Relative: 6.4 % (ref 3.0–12.0)
Neutro Abs: 4.3 10*3/uL (ref 1.4–7.7)
Neutrophils Relative %: 65.9 % (ref 43.0–77.0)
Platelets: 217 10*3/uL (ref 150.0–400.0)
RBC: 4.37 Mil/uL (ref 3.87–5.11)
RDW: 13.4 % (ref 11.5–15.5)
WBC: 6.6 10*3/uL (ref 4.0–10.5)

## 2022-08-18 LAB — URINALYSIS
Bilirubin Urine: NEGATIVE
Hgb urine dipstick: NEGATIVE
Ketones, ur: NEGATIVE
Leukocytes,Ua: NEGATIVE
Nitrite: NEGATIVE
Specific Gravity, Urine: 1.025 (ref 1.000–1.030)
Urine Glucose: NEGATIVE
Urobilinogen, UA: 0.2 (ref 0.0–1.0)
pH: 6 (ref 5.0–8.0)

## 2022-08-18 LAB — TSH: TSH: 1.14 u[IU]/mL (ref 0.35–5.50)

## 2022-08-18 MED ORDER — FLUTICASONE PROPIONATE 50 MCG/ACT NA SUSP: 2.0000 | Freq: Every day | NASAL | 11 refills | Status: DC | PRN

## 2022-08-18 MED ORDER — CALCITONIN (SALMON) 200 UNIT/ACT NA SOLN: 1.0000 | Freq: Every day | NASAL | 11 refills | Status: DC

## 2022-08-18 MED ORDER — NADOLOL 40 MG PO TABS: 20.0000 mg | ORAL_TABLET | Freq: Two times a day (BID) | ORAL | 3 refills | Status: DC

## 2022-08-18 NOTE — Patient Instructions (Signed)
tDAP vaccine is due

## 2022-08-18 NOTE — Assessment & Plan Note (Addendum)
  We discussed age appropriate health related issues, including available/recomended screening tests and vaccinations. Labs were ordered to be later reviewed . All questions were answered. We discussed one or more of the following - seat belt use, use of sunscreen/sun exposure exercise, fall risk reduction, second hand smoke exposure, firearm use and storage, seat belt use, a need for adhering to healthy diet and exercise. Labs were ordered.  All questions were answered. Mammo q 12 mo Last colon 2022 Dr Russella Dar, due in 2032 tDAP vaccine is due Pt declined Shingrix

## 2022-08-18 NOTE — Assessment & Plan Note (Signed)
On B12 

## 2022-08-18 NOTE — Progress Notes (Signed)
Subjective:  Patient ID: Lindsay Schultz, female    DOB: 07-07-1950  Age: 72 y.o. MRN: [id number]  CC: Follow-up (6 MNTH F/U)   HPI Lindsay Schultz presents for a well exam  Outpatient Medications Prior to Visit  Medication Sig Dispense Refill   acetaminophen (TYLENOL) 500 MG tablet Take 500-1,000 mg by mouth every 6 (six) hours as needed (for pain.).     albuterol (VENTOLIN HFA) 108 (90 Base) MCG/ACT inhaler inhale 2 puffs EVERY 6 HOURS AS NEEDED wheezing & SHORTNESS OF BREATH 18 g 11   amLODipine (NORVASC) 2.5 MG tablet Take 1 tablet (2.5 mg total) by mouth daily. 90 tablet 3   butalbital-acetaminophen-caffeine (FIORICET) 50-325-40 MG tablet Take 1 tablet by mouth every 6 (six) hours as needed for headache. 60 tablet 1   Carboxymethylcellulose Sodium 0.25 % SOLN Place 1 drop into both eyes 3 (three) times daily as needed (for dry/irritated eyes.).     cholecalciferol (VITAMIN D) 1000 units tablet Take 1,000 Units by mouth 2 (two) times daily.     CRANBERRY PO Take 1 capsule by mouth every evening.     Cyanocobalamin (VITAMIN B-12) 500 MCG SUBL 1 tab sl qd (Patient taking differently: Take 500 mcg by mouth 2 (two) times daily.) 100 tablet 11   pantoprazole (PROTONIX) 40 MG tablet Take 1 tablet (40 mg total) by mouth 2 (two) times daily. 90 tablet 3   sodium chloride HYPERTONIC 3 % nebulizer solution Take by nebulization in the morning and at bedtime. J47.9 750 mL 12   sulfamethoxazole-trimethoprim (BACTRIM DS) 800-160 MG tablet Take 1 tablet by mouth 2 (two) times daily.     calcitonin, salmon, (MIACALCIN/FORTICAL) 200 UNIT/ACT nasal spray Place 1 spray into alternate nostrils daily. 3.7 mL 5   fluticasone (FLONASE) 50 MCG/ACT nasal spray Place 2 sprays into both nostrils daily as needed for allergies. 16 g 3   nadolol (CORGARD) 40 MG tablet Take 0.5 tablets (20 mg total) by mouth 2 (two) times daily. 90 tablet 0   No facility-administered medications prior to visit.    ROS: Review  of Systems  Constitutional:  Negative for activity change, appetite change, chills, diaphoresis, fatigue, fever and unexpected weight change.  HENT:  Positive for congestion. Negative for dental problem, ear pain, hearing loss, mouth sores, postnasal drip, sinus pressure, sneezing, sore throat and voice change.   Eyes:  Negative for pain and visual disturbance.  Respiratory:  Negative for cough, chest tightness, wheezing and stridor.   Cardiovascular:  Negative for chest pain, palpitations and leg swelling.  Gastrointestinal:  Negative for abdominal distention, abdominal pain, blood in stool, nausea, rectal pain and vomiting.  Genitourinary:  Negative for decreased urine volume, difficulty urinating, dysuria, frequency, hematuria, menstrual problem, vaginal bleeding, vaginal discharge and vaginal pain.  Musculoskeletal:  Negative for arthralgias, back pain, gait problem, joint swelling and neck pain.  Skin:  Negative for color change, rash and wound.  Neurological:  Negative for dizziness, tremors, syncope, speech difficulty, weakness and light-headedness.  Hematological:  Negative for adenopathy.  Psychiatric/Behavioral:  Negative for behavioral problems, confusion, decreased concentration, dysphoric mood, hallucinations, sleep disturbance and suicidal ideas. The patient is not nervous/anxious and is not hyperactive.     Objective:  BP 122/80 (BP Location: Right Arm, Patient Position: Sitting, Cuff Size: Large)   Pulse (!) 55   Temp 98.4 F (36.9 C) (Oral)   Ht 5\' 3"  (1.6 m)   Wt 106 lb (48.1 kg)   SpO2 97%  BMI 18.78 kg/m   BP Readings from Last 3 Encounters:  08/18/22 122/80  07/26/22 124/70  02/17/22 128/76    Wt Readings from Last 3 Encounters:  08/18/22 106 lb (48.1 kg)  07/26/22 111 lb 3.2 oz (50.4 kg)  07/12/22 108 lb (49 kg)    Physical Exam Constitutional:      General: She is not in acute distress.    Appearance: Normal appearance. She is well-developed.  HENT:      Head: Normocephalic.     Right Ear: External ear normal.     Left Ear: External ear normal.     Nose: Nose normal.  Eyes:     General:        Right eye: No discharge.        Left eye: No discharge.     Conjunctiva/sclera: Conjunctivae normal.     Pupils: Pupils are equal, round, and reactive to light.  Neck:     Thyroid: No thyromegaly.     Vascular: No JVD.     Trachea: No tracheal deviation.  Cardiovascular:     Rate and Rhythm: Normal rate and regular rhythm.     Heart sounds: Normal heart sounds.  Pulmonary:     Effort: No respiratory distress.     Breath sounds: No stridor. No wheezing.  Abdominal:     General: Bowel sounds are normal. There is no distension.     Palpations: Abdomen is soft. There is no mass.     Tenderness: There is no abdominal tenderness. There is no guarding or rebound.  Musculoskeletal:        General: No tenderness.     Cervical back: Normal range of motion and neck supple. No rigidity.  Lymphadenopathy:     Cervical: No cervical adenopathy.  Skin:    Findings: No erythema or rash.  Neurological:     Cranial Nerves: No cranial nerve deficit.     Motor: No abnormal muscle tone.     Coordination: Coordination normal.     Deep Tendon Reflexes: Reflexes normal.  Psychiatric:        Behavior: Behavior normal.        Thought Content: Thought content normal.        Judgment: Judgment normal.     Lab Results  Component Value Date   WBC 6.9 02/17/2022   HGB 12.8 02/17/2022   HCT 38.1 02/17/2022   PLT 206.0 02/17/2022   GLUCOSE 163 (H) 02/17/2022   CHOL 202 (H) 02/16/2021   TRIG 135.0 02/16/2021   HDL 56.50 02/16/2021   LDLDIRECT 140.0 12/13/2017   LDLCALC 119 (H) 02/16/2021   ALT 13 02/17/2022   AST 22 02/17/2022   NA 141 02/17/2022   K 3.5 02/17/2022   CL 102 02/17/2022   CREATININE 0.64 02/17/2022   BUN 11 02/17/2022   CO2 34 (H) 02/17/2022   TSH 1.36 02/17/2022   INR 1.05 01/26/2016    CT CHEST HIGH RESOLUTION  Result  Date: 07/05/2022 CLINICAL DATA:  Bronchiectasis EXAM: CT CHEST WITHOUT CONTRAST TECHNIQUE: Multidetector CT imaging of the chest was performed following the standard protocol without intravenous contrast. High resolution imaging of the lungs, as well as inspiratory and expiratory imaging, was performed. RADIATION DOSE REDUCTION: This exam was performed according to the departmental dose-optimization program which includes automated exposure control, adjustment of the mA and/or kV according to patient size and/or use of iterative reconstruction technique. COMPARISON:  Chest CT dated June 22, 2021 FINDINGS: Cardiovascular: Normal heart size.  No pericardial effusion. Normal caliber thoracic aorta with severe atherosclerotic disease. Moderate coronary artery calcifications. Mediastinum/Nodes: Small hiatal hernia. Prior left thyroidectomy. No pathologically enlarged lymph nodes seen in the chest. Lungs/Pleura: Central airways are patent. Bilateral bronchiectasis and mucous plugging, most pronounced in the bilateral upper lobes and right middle lobe. Scattered clustered solid pulmonary nodules. Mucous plugging and nodularity overall is slightly improved when compared with the prior exam. Reference solid pulmonary nodule of the right upper lobe measuring 9 x 5 mm on series 4, image 69, unchanged when compared with the prior exam. Upper Abdomen: No acute abnormality. Musculoskeletal: No chest wall mass or suspicious bone lesions identified. IMPRESSION: 1. Bronchiectasis, mucous plugging and scattered solid pulmonary nodules, slightly improved when compared with the prior exam. 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 2. Coronary artery calcifications and aortic Atherosclerosis (ICD10-I70.0). Electronically Signed   By: Allegra Lai M.D.   On: 07/05/2022 17:31    Assessment & Plan:   Problem List Items Addressed This Visit     B12 deficiency    On B12        Relevant Orders   Vitamin B12   Well adult exam - Primary     We discussed age appropriate health related issues, including available/recomended screening tests and vaccinations. Labs were ordered to be later reviewed . All questions were answered. We discussed one or more of the following - seat belt use, use of sunscreen/sun exposure exercise, fall risk reduction, second hand smoke exposure, firearm use and storage, seat belt use, a need for adhering to healthy diet and exercise. Labs were ordered.  All questions were answered. Mammo q 12 mo Last colon 2022 Dr Russella Dar, due in 2032 tDAP vaccine is due Pt declined Shingrix      Relevant Orders   TSH   Urinalysis   CBC with Differential/Platelet   Lipid panel   Comprehensive metabolic panel   Pulmonary mycobacterial infection (HCC)    Doing well. F/u w/Dr Mannam q 1 year On a percussive vest daily      Relevant Medications   sulfamethoxazole-trimethoprim (BACTRIM DS) 800-160 MG tablet      Meds ordered this encounter  Medications   calcitonin, salmon, (MIACALCIN/FORTICAL) 200 UNIT/ACT nasal spray    Sig: Place 1 spray into alternate nostrils daily.    Dispense:  3.7 mL    Refill:  11   fluticasone (FLONASE) 50 MCG/ACT nasal spray    Sig: Place 2 sprays into both nostrils daily as needed for allergies.    Dispense:  16 g    Refill:  11   nadolol (CORGARD) 40 MG tablet    Sig: Take 0.5 tablets (20 mg total) by mouth 2 (two) times daily.    Dispense:  90 tablet    Refill:  3      Follow-up: Return in about 6 months (around 02/18/2023) for a follow-up visit.  Sonda Primes, MD

## 2022-08-18 NOTE — Assessment & Plan Note (Addendum)
Doing well. F/u w/Dr Mannam q 1 year On a percussive vest daily

## 2022-08-25 ENCOUNTER — Telehealth: Payer: Self-pay | Admitting: *Deleted

## 2022-08-25 DIAGNOSIS — R944 Abnormal results of kidney function studies: Secondary | ICD-10-CM | POA: Insufficient documentation

## 2022-08-25 NOTE — Addendum Note (Signed)
Addended by: Tresa Garter on: 08/25/2022 12:02 AM   Modules accepted: Orders

## 2022-08-25 NOTE — Telephone Encounter (Signed)
Pt states when she was in here at last appt told MD was on antibiotics for UTI. She states her mouth is white and hurst. ? If she has thrush would lie for MD to rx something to get rid of it.Marland KitchenRaechel Chute

## 2022-08-29 MED ORDER — FLUCONAZOLE 150 MG PO TABS: 150.0000 mg | ORAL_TABLET | Freq: Once | ORAL | 1 refills | Status: AC

## 2022-08-29 NOTE — Telephone Encounter (Signed)
Noted.  Will send a prescription for Diflucan.  Thanks

## 2022-08-30 NOTE — Telephone Encounter (Signed)
Notified pt MD sent rx to pharmacy.../lmb 

## 2022-09-15 ENCOUNTER — Other Ambulatory Visit: Payer: Self-pay | Admitting: *Deleted

## 2022-09-15 MED ORDER — FLUTICASONE PROPIONATE 50 MCG/ACT NA SUSP: 2.0000 | Freq: Every day | NASAL | 11 refills | Status: DC | PRN

## 2022-09-28 ENCOUNTER — Other Ambulatory Visit: Payer: Self-pay | Admitting: *Deleted

## 2022-09-28 MED ORDER — PANTOPRAZOLE SODIUM 40 MG PO TBEC: 40.0000 mg | DELAYED_RELEASE_TABLET | Freq: Two times a day (BID) | ORAL | 3 refills | Status: DC

## 2022-11-10 ENCOUNTER — Other Ambulatory Visit: Payer: Self-pay | Admitting: *Deleted

## 2022-11-10 NOTE — Telephone Encounter (Signed)
Rec'd refill request for Fluconazole Sent back denied not a daily meds.Marland KitchenRaechel Chute

## 2022-12-22 ENCOUNTER — Other Ambulatory Visit: Payer: Self-pay

## 2022-12-22 MED ORDER — CALCITONIN (SALMON) 200 UNIT/ACT NA SOLN: 1.0000 | Freq: Every day | NASAL | 11 refills | Status: DC

## 2023-01-13 ENCOUNTER — Other Ambulatory Visit: Payer: Self-pay | Admitting: Internal Medicine

## 2023-01-13 DIAGNOSIS — Z1231 Encounter for screening mammogram for malignant neoplasm of breast: Secondary | ICD-10-CM

## 2023-02-21 ENCOUNTER — Ambulatory Visit (INDEPENDENT_AMBULATORY_CARE_PROVIDER_SITE_OTHER): Payer: Medicare Other | Admitting: Internal Medicine

## 2023-02-21 ENCOUNTER — Encounter: Payer: Self-pay | Admitting: Internal Medicine

## 2023-02-21 ENCOUNTER — Ambulatory Visit
Admission: RE | Admit: 2023-02-21 | Discharge: 2023-02-21 | Disposition: A | Discharge status: Home / Self Care | Payer: Medicare Other | Source: Ambulatory Visit | Attending: Internal Medicine | Admitting: Internal Medicine

## 2023-02-21 VITALS — BP 130/84 | HR 57 | Temp 98.4°F | Ht 63.0 in | Wt 104.0 lb

## 2023-02-21 DIAGNOSIS — I10 Essential (primary) hypertension: Secondary | ICD-10-CM

## 2023-02-21 DIAGNOSIS — J479 Bronchiectasis, uncomplicated: Secondary | ICD-10-CM | POA: Diagnosis not present

## 2023-02-21 DIAGNOSIS — Z1231 Encounter for screening mammogram for malignant neoplasm of breast: Secondary | ICD-10-CM

## 2023-02-21 DIAGNOSIS — E538 Deficiency of other specified B group vitamins: Secondary | ICD-10-CM | POA: Diagnosis not present

## 2023-02-21 DIAGNOSIS — K219 Gastro-esophageal reflux disease without esophagitis: Secondary | ICD-10-CM

## 2023-02-21 DIAGNOSIS — R944 Abnormal results of kidney function studies: Secondary | ICD-10-CM

## 2023-02-21 DIAGNOSIS — M810 Age-related osteoporosis without current pathological fracture: Secondary | ICD-10-CM | POA: Insufficient documentation

## 2023-02-21 DIAGNOSIS — Z23 Encounter for immunization: Secondary | ICD-10-CM

## 2023-02-21 LAB — COMPREHENSIVE METABOLIC PANEL
ALT: 15 U/L (ref 0–35)
AST: 22 U/L (ref 0–37)
Albumin: 4.5 g/dL (ref 3.5–5.2)
Alkaline Phosphatase: 79 U/L (ref 39–117)
BUN: 13 mg/dL (ref 6–23)
CO2: 32 meq/L (ref 19–32)
Calcium: 9.9 mg/dL (ref 8.4–10.5)
Chloride: 101 meq/L (ref 96–112)
Creatinine, Ser: 0.77 mg/dL (ref 0.40–1.20)
GFR: 77.38 mL/min (ref >60.00)
Glucose, Bld: 90 mg/dL (ref 70–99)
Potassium: 3.9 meq/L (ref 3.5–5.1)
Sodium: 141 meq/L (ref 135–145)
Total Bilirubin: 0.4 mg/dL (ref 0.2–1.2)
Total Protein: 7.5 g/dL (ref 6.0–8.3)

## 2023-02-21 MED ORDER — AMLODIPINE BESYLATE 2.5 MG PO TABS: 2.5000 mg | ORAL_TABLET | Freq: Every day | ORAL | 3 refills | Status: DC

## 2023-02-21 NOTE — Assessment & Plan Note (Signed)
Protonix.  ?

## 2023-02-21 NOTE — Assessment & Plan Note (Signed)
On Vit D and Miacalcin nasal spray

## 2023-02-21 NOTE — Assessment & Plan Note (Signed)
On B12 

## 2023-02-21 NOTE — Assessment & Plan Note (Signed)
5/24 GFR 46 Hydrate well, repeat labs

## 2023-02-21 NOTE — Assessment & Plan Note (Signed)
Cont on Nadolol, Amlodipine

## 2023-02-21 NOTE — Addendum Note (Signed)
Addended by: Delsa Grana R on: 02/21/2023 01:59 PM   Modules accepted: Orders

## 2023-02-21 NOTE — Progress Notes (Signed)
Subjective:  Patient ID: Lindsay Schultz, female    DOB: Sep 02, 1950  Age: 72 y.o. MRN: [id number]  CC: Medical Management of Chronic Issues (6 MNTH F/U)   HPI Lindsay Schultz presents for osteoporosis,  HTN, lung disease  Outpatient Medications Prior to Visit  Medication Sig Dispense Refill   acetaminophen (TYLENOL) 500 MG tablet Take 500-1,000 mg by mouth every 6 (six) hours as needed (for pain.).     albuterol (VENTOLIN HFA) 108 (90 Base) MCG/ACT inhaler inhale 2 puffs EVERY 6 HOURS AS NEEDED wheezing & SHORTNESS OF BREATH 18 g 11   butalbital-acetaminophen-caffeine (FIORICET) 50-325-40 MG tablet Take 1 tablet by mouth every 6 (six) hours as needed for headache. 60 tablet 1   calcitonin, salmon, (MIACALCIN/FORTICAL) 200 UNIT/ACT nasal spray Place 1 spray into alternate nostrils daily. 3.7 mL 11   Carboxymethylcellulose Sodium 0.25 % SOLN Place 1 drop into both eyes 3 (three) times daily as needed (for dry/irritated eyes.).     cholecalciferol (VITAMIN D) 1000 units tablet Take 1,000 Units by mouth 2 (two) times daily.     CRANBERRY PO Take 1 capsule by mouth every evening.     Cyanocobalamin (VITAMIN B-12) 500 MCG SUBL 1 tab sl qd (Patient taking differently: Take 500 mcg by mouth 2 (two) times daily.) 100 tablet 11   fluticasone (FLONASE) 50 MCG/ACT nasal spray Place 2 sprays into both nostrils daily as needed for allergies. 16 g 11   nadolol (CORGARD) 40 MG tablet Take 0.5 tablets (20 mg total) by mouth 2 (two) times daily. 90 tablet 3   pantoprazole (PROTONIX) 40 MG tablet Take 1 tablet (40 mg total) by mouth 2 (two) times daily. 90 tablet 3   sodium chloride HYPERTONIC 3 % nebulizer solution Take by nebulization in the morning and at bedtime. J47.9 750 mL 12   sulfamethoxazole-trimethoprim (BACTRIM DS) 800-160 MG tablet Take 1 tablet by mouth 2 (two) times daily.     amLODipine (NORVASC) 2.5 MG tablet Take 1 tablet (2.5 mg total) by mouth daily. 90 tablet 3   No  facility-administered medications prior to visit.    ROS: Review of Systems  Constitutional:  Negative for activity change, appetite change, chills, diaphoresis, fatigue, fever and unexpected weight change.  HENT:  Negative for congestion, dental problem, ear pain, hearing loss, mouth sores, postnasal drip, sinus pressure, sneezing, sore throat and voice change.   Eyes:  Negative for pain and visual disturbance.  Respiratory:  Negative for cough, chest tightness, wheezing and stridor.   Cardiovascular:  Negative for chest pain, palpitations and leg swelling.  Gastrointestinal:  Negative for abdominal distention, abdominal pain, blood in stool, nausea, rectal pain and vomiting.  Genitourinary:  Negative for decreased urine volume, difficulty urinating, dysuria, frequency, hematuria, menstrual problem, vaginal bleeding, vaginal discharge and vaginal pain.  Musculoskeletal:  Negative for arthralgias, back pain, gait problem, joint swelling and neck pain.  Skin:  Negative for color change, rash and wound.  Neurological:  Negative for dizziness, tremors, syncope, speech difficulty, weakness and light-headedness.  Hematological:  Negative for adenopathy.  Psychiatric/Behavioral:  Negative for behavioral problems, confusion, decreased concentration, dysphoric mood, hallucinations, sleep disturbance and suicidal ideas. The patient is not nervous/anxious and is not hyperactive.     Objective:  BP 130/84 (BP Location: Right Arm, Patient Position: Sitting, Cuff Size: Normal)   Pulse (!) 57   Temp 98.4 F (36.9 C) (Oral)   Ht 5\' 3"  (1.6 m)   Wt 104 lb (47.2 kg)  SpO2 97%   BMI 18.42 kg/m   BP Readings from Last 3 Encounters:  02/21/23 130/84  08/18/22 122/80  07/26/22 124/70    Wt Readings from Last 3 Encounters:  02/21/23 104 lb (47.2 kg)  08/18/22 106 lb (48.1 kg)  07/26/22 111 lb 3.2 oz (50.4 kg)    Physical Exam Constitutional:      General: She is not in acute distress.     Appearance: Normal appearance. She is well-developed.  HENT:     Head: Normocephalic.     Right Ear: External ear normal.     Left Ear: External ear normal.     Nose: Nose normal.  Eyes:     General:        Right eye: No discharge.        Left eye: No discharge.     Conjunctiva/sclera: Conjunctivae normal.     Pupils: Pupils are equal, round, and reactive to light.  Neck:     Thyroid: No thyromegaly.     Vascular: No JVD.     Trachea: No tracheal deviation.  Cardiovascular:     Rate and Rhythm: Normal rate and regular rhythm.     Heart sounds: Normal heart sounds.  Pulmonary:     Effort: No respiratory distress.     Breath sounds: No stridor. No wheezing.  Abdominal:     General: Bowel sounds are normal. There is no distension.     Palpations: Abdomen is soft. There is no mass.     Tenderness: There is no abdominal tenderness. There is no guarding or rebound.  Musculoskeletal:        General: No tenderness.     Cervical back: Normal range of motion and neck supple. No rigidity.  Lymphadenopathy:     Cervical: No cervical adenopathy.  Skin:    Findings: No erythema or rash.  Neurological:     Cranial Nerves: No cranial nerve deficit.     Motor: No abnormal muscle tone.     Coordination: Coordination normal.     Deep Tendon Reflexes: Reflexes normal.  Psychiatric:        Behavior: Behavior normal.        Thought Content: Thought content normal.        Judgment: Judgment normal.     Lab Results  Component Value Date   WBC 6.6 08/18/2022   HGB 13.2 08/18/2022   HCT 39.7 08/18/2022   PLT 217.0 08/18/2022   GLUCOSE 87 08/18/2022   CHOL 210 (H) 08/18/2022   TRIG 169.0 (H) 08/18/2022   HDL 50.50 08/18/2022   LDLDIRECT 140.0 12/13/2017   LDLCALC 126 (H) 08/18/2022   ALT 17 08/18/2022   AST 26 08/18/2022   NA 137 08/18/2022   K 4.4 08/18/2022   CL 100 08/18/2022   CREATININE 1.18 08/18/2022   BUN 15 08/18/2022   CO2 28 08/18/2022   TSH 1.14 08/18/2022   INR  1.05 01/26/2016    No results found.  Assessment & Plan:   Problem List Items Addressed This Visit     Essential hypertension - Primary    Cont on Nadolol, Amlodipine      Relevant Medications   amLODipine (NORVASC) 2.5 MG tablet   GERD (gastroesophageal reflux disease)    Protonix      B12 deficiency    On B12       Bronchiectasis without complication (HCC)    F/u Dr Isaiah Serge On a percussive vest daily  Decreased GFR    5/24 GFR 46 Hydrate well, repeat labs      Osteoporosis    On Vit D and Miacalcin nasal spray         Meds ordered this encounter  Medications   amLODipine (NORVASC) 2.5 MG tablet    Sig: Take 1 tablet (2.5 mg total) by mouth daily.    Dispense:  90 tablet    Refill:  3      Follow-up: Return in about 6 months (around 08/21/2023) for Wellness Exam.  Sonda Primes, MD

## 2023-02-21 NOTE — Assessment & Plan Note (Signed)
F/u Dr Vaughan Browner ?On a percussive vest daily ?

## 2023-07-13 ENCOUNTER — Ambulatory Visit (INDEPENDENT_AMBULATORY_CARE_PROVIDER_SITE_OTHER): Payer: Medicare Other

## 2023-07-13 VITALS — Ht 63.0 in | Wt 104.0 lb

## 2023-07-13 DIAGNOSIS — Z0000 Encounter for general adult medical examination without abnormal findings: Secondary | ICD-10-CM | POA: Diagnosis not present

## 2023-07-13 DIAGNOSIS — M810 Age-related osteoporosis without current pathological fracture: Secondary | ICD-10-CM

## 2023-07-13 NOTE — Patient Instructions (Addendum)
 Lindsay Schultz , Thank you for taking time to come for your Medicare Wellness Visit. I appreciate your ongoing commitment to your health goals. Please review the following plan we discussed and let me know if I can assist you in the future.   Referrals/Orders/Follow-Ups/Clinician Recommendations: Aim for 30 minutes of exercise or brisk walking, 6-8 glasses of water, and 5 servings of fruits and vegetables each day. Ordered a DEXA scan.  Patient is due for a Mammogram in 02/2024.  Educated and advised on getting the Tdap vaccine at local pharmacy.  This is a list of the screening recommended for you and due dates:  Health Maintenance  Topic Date Due   DEXA scan (bone density measurement)  11/12/2017   COVID-19 Vaccine (3 - Pfizer risk series) 07/30/2019   DTaP/Tdap/Td vaccine (2 - Td or Tdap) 09/27/2022   Mammogram  02/21/2024   Medicare Annual Wellness Visit  07/12/2024   Colon Cancer Screening  01/13/2031   Pneumonia Vaccine  Completed   Flu Shot  Completed   Hepatitis C Screening  Completed   HPV Vaccine  Aged Out   Cologuard (Stool DNA test)  Discontinued   Zoster (Shingles) Vaccine  Discontinued    Advanced directives: (Copy Requested) Please bring a copy of your health care power of attorney and living will to the office to be added to your chart at your convenience. You can mail to [facility] [address] [place], [place] [address] or email to [email or web address]  Next Medicare Annual Wellness Visit scheduled for next year: Yes

## 2023-07-13 NOTE — Progress Notes (Addendum)
 Subjective:   Lindsay Schultz is a 73 y.o. who presents for a Medicare Wellness preventive visit.  Visit Complete: Virtual I connected with  Cordell Coke Krajewski on 07/13/23 by a audio enabled telemedicine application and verified that I am speaking with the correct person using two identifiers.  Patient Location: Home  Provider Location: Office/Clinic  I discussed the limitations of evaluation and management by telemedicine. The patient expressed understanding and agreed to proceed.  Vital Signs: Because this visit was a virtual/telehealth visit, some criteria may be missing or patient reported. Any vitals not documented were not able to be obtained and vitals that have been documented are patient reported.  VideoDeclined- This patient declined Librarian, academic. Therefore the visit was completed with audio only.  Persons Participating in Visit: Patient.  AWV Questionnaire: No: Patient Medicare AWV questionnaire was not completed prior to this visit.  Cardiac Risk Factors include: advanced age (>56men, >53 women);hypertension     Objective:    Today's Vitals   07/13/23 0934  Weight: 104 lb (47.2 kg)  Height: 5\' 3"  (1.6 m)   Body mass index is 18.42 kg/m.     07/13/2023    9:32 AM 07/12/2022   10:34 AM 07/10/2021    2:37 PM 11/02/2020   10:26 AM 07/01/2020    2:12 PM 02/21/2018    8:26 AM 12/13/2016    2:19 PM  Advanced Directives  Does Patient Have a Medical Advance Directive? Yes Yes Yes Yes Yes Yes Yes  Type of Estate agent of [place];Living will Healthcare Power of [place];Living will Healthcare Power of [place];Living will Healthcare Power of Attorney Living will;Healthcare Power of [employer] Power of [employer] Power of [place];Living will  Does patient want to make changes to medical advance directive?     No - Patient declined    Copy of Healthcare Power of Attorney in Chart? No - copy requested  No - copy requested No - copy requested  No - copy requested No - copy requested No - copy requested    Current Medications (verified) Outpatient Encounter Medications as of 07/13/2023  Medication Sig   acetaminophen (TYLENOL) 500 MG tablet Take 500-1,000 mg by mouth every 6 (six) hours as needed (for pain.).   albuterol (VENTOLIN HFA) 108 (90 Base) MCG/ACT inhaler inhale 2 puffs EVERY 6 HOURS AS NEEDED wheezing & SHORTNESS OF BREATH   amLODipine (NORVASC) 2.5 MG tablet Take 1 tablet (2.5 mg total) by mouth daily.   butalbital-acetaminophen-caffeine (FIORICET) 50-325-40 MG tablet Take 1 tablet by mouth every 6 (six) hours as needed for headache.   calcitonin, salmon, (MIACALCIN/FORTICAL) 200 UNIT/ACT nasal spray Place 1 spray into alternate nostrils daily.   Carboxymethylcellulose Sodium 0.25 % SOLN Place 1 drop into both eyes 3 (three) times daily as needed (for dry/irritated eyes.).   cholecalciferol (VITAMIN D) 1000 units tablet Take 1,000 Units by mouth 2 (two) times daily.   CRANBERRY PO Take 1 capsule by mouth every evening.   Cyanocobalamin (VITAMIN B-12) 500 MCG SUBL 1 tab sl qd (Patient taking differently: Take 500 mcg by mouth 2 (two) times daily.)   fluticasone (FLONASE) 50 MCG/ACT nasal spray Place 2 sprays into both nostrils daily as needed for allergies.   nadolol (CORGARD) 40 MG tablet Take 0.5 tablets (20 mg total) by mouth 2 (two) times daily.   pantoprazole (PROTONIX) 40 MG tablet Take 1 tablet (40 mg total) by mouth 2 (two) times daily.   [DISCONTINUED] sodium chloride  HYPERTONIC 3 % nebulizer solution Take by nebulization in the morning and at bedtime. J47.9   [DISCONTINUED] sulfamethoxazole-trimethoprim (BACTRIM DS) 800-160 MG tablet Take 1 tablet by mouth 2 (two) times daily.   No facility-administered encounter medications on file as of 07/13/2023.    Allergies (verified) Meloxicam, Paroxetine, Penicillins, Zanaflex [tizanidine hcl], Ciprofloxacin, Clarithromycin, and  Zithromax [azithromycin]   History: Past Medical History:  Diagnosis Date   Allergic rhinitis    Benign paroxysmal positional vertigo 06/14/2015   Breast cancer (HCC) 2008   Bronchiectasis (HCC)    GERD (gastroesophageal reflux disease)    HTN (hypertension)    Hx of breast cancer    Osteopenia    Personal history of radiation therapy 2008   Past Surgical History:  Procedure Laterality Date   ABDOMINAL HYSTERECTOMY     APPENDECTOMY     BREAST LUMPECTOMY  2008   Left   NASAL SINUS SURGERY  1997   VIDEO BRONCHOSCOPY Bilateral 02/21/2018   Procedure: VIDEO BRONCHOSCOPY WITHOUT FLUORO;  Surgeon: Chilton Greathouse, MD;  Location: [facility];  Service: Cardiopulmonary;  Laterality: Bilateral;   Family History  Problem Relation Age of Onset   Sjogren's syndrome Mother    Arthritis Father 61       RA   Emphysema Father    Heart disease Father    Hodgkin's lymphoma Brother    Hypertension Other    Breast cancer Neg Hx    Social History   Socioeconomic History   Marital status: Married    Spouse name: Not on file   Number of children: Not on file   Years of education: Not on file   Highest education level: Not on file  Occupational History   Occupation: Retired    Comment: Engineer, site  Tobacco Use   Smoking status: Never    Passive exposure: Never   Smokeless tobacco: Never  Vaping Use   Vaping status: Never Used  Substance and Sexual Activity   Alcohol use: Not Currently    Alcohol/week: 1.0 standard drink of alcohol    Types: 1 Glasses of wine per week    Comment: occasional   Drug use: No   Sexual activity: Yes  Other Topics Concern   Not on file  Social History Narrative   Regular Exercise- yes   Social Drivers of Health   Financial Resource Strain: Low Risk  (07/13/2023)   Overall Financial Resource Strain (CARDIA)    Difficulty of Paying Living Expenses: Not hard at all  Food Insecurity: No Food Insecurity (07/13/2023)   Hunger Vital Sign    Worried  About Running Out of Food in the Last Year: Never true    Ran Out of Food in the Last Year: Never true  Transportation Needs: No Transportation Needs (07/13/2023)   PRAPARE - Administrator, Civil Service (Medical): No    Lack of Transportation (Non-Medical): No  Physical Activity: Insufficiently Active (07/13/2023)   Exercise Vital Sign    Days of Exercise per Week: 3 days    Minutes of Exercise per Session: 30 min  Stress: No Stress Concern Present (07/13/2023)   [employer] of Occupational Health - Occupational Stress Questionnaire    Feeling of Stress : Not at all  Social Connections: Socially Integrated (07/13/2023)   Social Connection and Isolation Panel [NHANES]    Frequency of Communication with Friends and Family: More than three times a week    Frequency of Social Gatherings with Friends and Family: More than three  times a week    Attends Religious Services: More than 4 times per year    Active Member of Clubs or Organizations: Yes    Attends Engineer, structural: More than 4 times per year    Marital Status: Married    Tobacco Counseling Counseling given: No    Clinical Intake:  Pre-visit preparation completed: Yes  Pain : No/denies pain     BMI - recorded: 18.42 Nutritional Status: BMI <19  Underweight Nutritional Risks: None Diabetes: No  No results found for: "HGBA1C"   How often do you need to have someone help you when you read instructions, pamphlets, or other written materials from your doctor or pharmacy?: 1 - Never  Interpreter Needed?: No  Information entered by :: Hassell Halim, CMA   Activities of Daily Living     07/13/2023    9:37 AM  In your present state of health, do you have any difficulty performing the following activities:  Hearing? 0  Vision? 0  Difficulty concentrating or making decisions? 0  Walking or climbing stairs? 0  Dressing or bathing? 0  Doing errands, shopping? 0  Preparing Food and eating ?  N  Using the Toilet? N  In the past six months, have you accidently leaked urine? N  Do you have problems with loss of bowel control? N  Managing your Medications? N  Managing your Finances? N  Housekeeping or managing your Housekeeping? N    Patient Care Team: Plotnikov, Georgina Quint, MD as PCP - General Chilton Greathouse, MD as Consulting Physician (Pulmonary Disease) Meryl Dare, MD (Inactive) as Consulting Physician (Gastroenterology) Albin Felling, OD (Optometry)  Indicate any recent Medical Services you may have received from other than [facility] providers in the past year (date may be approximate).     Assessment:   This is a routine wellness examination for [place].  Hearing/Vision screen Hearing Screening - Comments:: Denies hearing difficulties   Vision Screening - Comments:: Wears rx glasses - up to date with routine eye exams with Dr Precious Bard   Goals Addressed               This Visit's Progress     Patient Stated (pt-stated)        Patient stated she plans to stay healthy and active.       Depression Screen     07/13/2023    9:41 AM 08/18/2022    1:07 PM 07/12/2022   10:42 AM 02/17/2022    1:31 PM 07/10/2021    2:31 PM 05/07/2020   11:36 AM 10/01/2019    1:41 PM  PHQ 2/9 Scores  PHQ - 2 Score 0 0 0 0 0 0 0  PHQ- 9 Score 0 0 0 1       Fall Risk     07/13/2023    9:37 AM 08/18/2022    1:07 PM 07/12/2022   10:35 AM 02/17/2022    1:30 PM 07/10/2021    2:29 PM  Fall Risk   Falls in the past year? 0 0 0 0 0  Number falls in past yr: 0 0 0 0 0  Injury with Fall? 0 0 0 0 0  Risk for fall due to : No Fall Risks No Fall Risks No Fall Risks No Fall Risks No Fall Risks  Follow up Falls prevention discussed;Falls evaluation completed Falls evaluation completed Falls prevention discussed  Falls prevention discussed    MEDICARE RISK AT HOME:  Medicare Risk at Home  Any stairs in or around the home?: Yes If so, are there any without handrails?: No Home free of loose  throw rugs in walkways, pet beds, electrical cords, etc?: Yes Adequate lighting in your home to reduce risk of falls?: Yes Life alert?: No Use of a cane, walker or w/c?: No Grab bars in the bathroom?: Yes Shower chair or bench in shower?: Yes Elevated toilet seat or a handicapped toilet?: No  TIMED UP AND GO:  Was the test performed?  No  Cognitive Function: 6CIT completed        07/13/2023    9:40 AM 07/12/2022   10:46 AM 07/10/2021    2:37 PM  6CIT Screen  What Year? 0 points 0 points 0 points  What month? 0 points 0 points 0 points  What time? 0 points 0 points 0 points  Count back from 20 0 points 0 points 0 points  Months in reverse 0 points 0 points 0 points  Repeat phrase 0 points 0 points 0 points  Total Score 0 points 0 points 0 points    Immunizations Immunization History  Administered Date(s) Administered   Fluad Quad(high Dose 65+) 02/16/2021, 02/17/2022   Fluad Trivalent(High Dose 65+) 02/21/2023   Influenza Split 02/15/2012, 02/13/2015   Influenza Whole 02/02/2008, 01/29/2009, 12/12/2009   Influenza, High Dose Seasonal PF 12/13/2016, 01/18/2019   Influenza,inj,Quad PF,6+ Mos 02/23/2013, 02/25/2014, 02/02/2016, 12/13/2017   Influenza,inj,quad, With Preservative 03/21/2020   PFIZER(Purple Top)SARS-COV-2 Vaccination 06/12/2019, 07/02/2019   Pneumococcal Conjugate-13 11/07/2015   Pneumococcal Polysaccharide-23 05/28/2014, 05/07/2020   Tdap 09/26/2012    Screening Tests Health Maintenance  Topic Date Due   DEXA SCAN  11/12/2017   COVID-19 Vaccine (3 - Pfizer risk series) 07/30/2019   DTaP/Tdap/Td (2 - Td or Tdap) 09/27/2022   MAMMOGRAM  02/21/2024   Medicare Annual Wellness (AWV)  07/12/2024   Colonoscopy  01/13/2031   Pneumonia Vaccine 59+ Years old  Completed   INFLUENZA VACCINE  Completed   Hepatitis C Screening  Completed   HPV VACCINES  Aged Out   Fecal DNA (Cologuard)  Discontinued   Zoster Vaccines- Shingrix  Discontinued    Health  Maintenance  Health Maintenance Due  Topic Date Due   DEXA SCAN  11/12/2017   COVID-19 Vaccine (3 - Pfizer risk series) 07/30/2019   DTaP/Tdap/Td (2 - Td or Tdap) 09/27/2022   Health Maintenance Items Addressed: 07/13/2023 DEXA ordered on 07/13/2023.  Additional Screening:  Vision Screening: Recommended annual ophthalmology exams for early detection of glaucoma and other disorders of the eye.  Dental Screening: Recommended annual dental exams for proper oral hygiene  Community Resource Referral / Chronic Care Management: CRR required this visit?  No   CCM required this visit?  No     Plan:     I have personally reviewed and noted the following in the patient's chart:   Medical and social history Use of alcohol, tobacco or illicit drugs  Current medications and supplements including opioid prescriptions. Patient is not currently taking opioid prescriptions. Functional ability and status Nutritional status Physical activity Advanced directives List of other physicians Hospitalizations, surgeries, and ER visits in previous 12 months Vitals Screenings to include cognitive, depression, and falls Referrals and appointments  In addition, I have reviewed and discussed with patient certain preventive protocols, quality metrics, and best practice recommendations. A written personalized care plan for preventive services as well as general preventive health recommendations were provided to patient.     Darreld Mclean, CMA  07/13/2023   After Visit Summary: (Mail) Due to this being a telephonic visit, the after visit summary with patients personalized plan was offered to patient via mail   Notes: Please refer to Routing Comments.  Medical screening examination/treatment/procedure(s) were performed by non-physician practitioner and as supervising physician I was immediately available for consultation/collaboration.  I agree with above. Jacinta Shoe, MD

## 2023-08-08 ENCOUNTER — Encounter: Payer: Self-pay | Admitting: Pulmonary Disease

## 2023-08-08 ENCOUNTER — Ambulatory Visit: Payer: Medicare Other | Admitting: Pulmonary Disease

## 2023-08-08 VITALS — BP 171/72 | HR 54 | Ht 63.0 in | Wt 101.0 lb

## 2023-08-08 DIAGNOSIS — K219 Gastro-esophageal reflux disease without esophagitis: Secondary | ICD-10-CM

## 2023-08-08 DIAGNOSIS — J45998 Other asthma: Secondary | ICD-10-CM

## 2023-08-08 DIAGNOSIS — J479 Bronchiectasis, uncomplicated: Secondary | ICD-10-CM | POA: Diagnosis not present

## 2023-08-08 MED ORDER — ALBUTEROL SULFATE HFA 108 (90 BASE) MCG/ACT IN AERS: INHALATION_SPRAY | RESPIRATORY_TRACT | 11 refills | Status: AC

## 2023-08-08 NOTE — Progress Notes (Signed)
 Lindsay Schultz Force    [id number]    May 10, 1950  Primary Care Physician:Plotnikov, Oakley Bellman, MD  Referring Physician: Genia Kettering, MD [address] [place],  [place] [address]  Chief complaint:   Follow up for Bronchiectasis with MAI infection Sinusitis  Seasonal allergies GERD  HPI: Lindsay Schultz is a 73 y.o.  female with history of asthmatic bronchitis, sinusitis, and seasonal allergies.   She's had a workup for her bronchiectasis including CBC, Ig levels, sputum cultures, A1AT levels, RF which were normal. PFTs do not show any obstruction and very mild restriction and reduction in diffusion capacity.   Underwent bronchoscopy on 12/22/2017 with cultures growing MAI She has been seen by ID and started on antibiotic therapy. Managed by Dr. Seymour Dapper Completed 18 months of therapy.  Taken off antimicrobial therapy on June 2021 with intention to follow-up with CT. Per ID note if things worsen either clinically or radiographically + clinically, will consider restarting 3 drug therapy + three times weekly amikacin.    Had a mild bronchiectasis flare in January 2022 requiring prednisone  for 5 days Finished pulmonary rehab in [place] in early 2022  She has attacks of bronchiectasis exacerbations about once a year. She states that the infection usually begins in the sinus and then moves into the lung. This usually resolves after course of antibiotics. She has H/O seasonal allergies (worst in fall and spring) and asthmatic bronchitis. Never smoked.   Interim history: Discussed the use of AI scribe software for clinical note transcription with the patient, who gave verbal consent to proceed.  History of Present Illness Lindsay Schultz is a 73 year old female with bronchiectasis and MAI infection who presents for a routine follow-up.  Her symptoms have been stable since completing treatment for MAI infection in June 2021. She experiences a cough primarily in the mornings, which  clears up during the day. She uses an albuterol  inhaler several times a month as needed, but not daily. She also uses a chest percussion device daily, which she feels helps prevent further issues.  She has a history of asthmatic bronchitis, which is under good control. She is not on a regular inhaler for this condition. She experiences some increased difficulty during allergy season, which she attributes to environmental factors.  She has acid reflux and is currently taking Protonix , which she reports is controlling her symptoms.  She has not been able to use hypertonic saline due to a shortage, but she uses Mucinex  as needed.    Outpatient Encounter Medications as of 08/08/2023  Medication Sig   acetaminophen  (TYLENOL ) 500 MG tablet Take 500-1,000 mg by mouth every 6 (six) hours as needed (for pain.).   albuterol  (VENTOLIN  HFA) 108 (90 Base) MCG/ACT inhaler inhale 2 puffs EVERY 6 HOURS AS NEEDED wheezing & SHORTNESS OF BREATH   amLODipine  (NORVASC ) 2.5 MG tablet Take 1 tablet (2.5 mg total) by mouth daily.   butalbital -acetaminophen -caffeine  (FIORICET) 50-325-40 MG tablet Take 1 tablet by mouth every 6 (six) hours as needed for headache.   calcitonin, salmon, (MIACALCIN/FORTICAL) 200 UNIT/ACT nasal spray Place 1 spray into alternate nostrils daily.   Carboxymethylcellulose Sodium 0.25 % SOLN Place 1 drop into both eyes 3 (three) times daily as needed (for dry/irritated eyes.).   cholecalciferol (VITAMIN D ) 1000 units tablet Take 1,000 Units by mouth 2 (two) times daily.   CRANBERRY PO Take 1 capsule by mouth every evening.   Cyanocobalamin  (VITAMIN B-12) 500 MCG SUBL 1 tab sl  qd (Patient taking differently: Take 500 mcg by mouth 2 (two) times daily.)   fluticasone  (FLONASE ) 50 MCG/ACT nasal spray Place 2 sprays into both nostrils daily as needed for allergies.   nadolol  (CORGARD ) 40 MG tablet Take 0.5 tablets (20 mg total) by mouth 2 (two) times daily.   pantoprazole  (PROTONIX ) 40 MG tablet  Take 1 tablet (40 mg total) by mouth 2 (two) times daily.   No facility-administered encounter medications on file as of 08/08/2023.   Physical Exam: Blood pressure (!) 171/72, pulse (!) 54, height 5\' 3"  (1.6 m), weight 101 lb (45.8 kg), SpO2 97%. Gen:      No acute distress HEENT:  EOMI, sclera anicteric Neck:     No masses; no thyromegaly Lungs:    Clear to auscultation bilaterally; normal respiratory effort CV:         Regular rate and rhythm; no murmurs Abd:      + bowel sounds; soft, non-tender; no palpable masses, no distension Ext:    No edema; adequate peripheral perfusion Skin:      Warm and dry; no rash Neuro: alert and oriented x 3 Psych: normal mood and affect   Data Reviewed: Imaging CT scan 11/15/14-Multiple nodules in the left upper lobe, bilateral bronchiectasis. CT scan 01/24/16 Stable pulmonary nodules, bilateral bronchiectasis. Left upper lobe ground glass opacities. Images reviewed. CT scan 11/16/2017- stable pulmonary nodules, bilateral bronchiectasis, new area of opacity in the right upper lobe consistent with mucus impaction.  CT chest 07/25/2019-stable bronchiectasis with mild increase in tree-in-bud appearance in the right upper lobe.  Stable pulmonary nodules. CT chest 01/29/2020-slight interval worsening of tree-in-bud appearance with nodular consolidation in the apical right upper lobe. CT chest 06/19/2020-increased nodularity of the lungs, bronchiectasis CT chest 06/22/2021-stable inflammatory changes in the lungs with tree-in-bud suggestive of chronic MAI. CT chest 07/02/2022-mild improvement in bronchiectasis, pulm nodules and mucous plugging. I reviewed the images personally.  Labs 12/09/14 IgG-773 IgG 8-2 37 IgM-64 IgE-56 Rheumatoid factor < 10 All within normal limits.   AFB and fungal cultures 11/19/14- no acid-fast bacilli, Candida albicans. 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 Repeat sputum AFB 01/05/2018-negative  Bronchoscopy  02/21/2018 Cultures- Mycobacterium avium complex BAL cell count-WBC 635, 9% lymphs, 1% eos, 76% neutrophils   PFTs  02/10/15 FVC 2.35 (74%), FEV1 1.79 (74%), F/F 77, TLC 79%, DLCO 78% No obstruction. Mild reduction in lung volumes and diffusion capacity  Assessment & Plan Asthmatic bronchitis Asthmatic bronchitis is well-controlled with stable symptoms. Albuterol  inhaler is used infrequently, several times a month, and no daily inhaler is required. - Renew albuterol  prescription.  Bronchiectasis, MAI infection Bronchiectasis is well-managed with a morning cough that clears during the day. She completed treatment for MAI infection in June 2021 and continues daily use of a chest percussion vest. Hypertonic saline is not used due to shortage. Mucinex  is used as needed. No CT scan is needed this year due to stable symptoms.  - Continue daily use of chest percussion vest. - Use Mucinex  as needed. - No need for CT scan this year; reassess next year or based on symptoms.  Acid reflux Acid reflux is well-controlled with Protonix .    Plan/Recommendations: - Continue albuterol  as needed - Percussion vest, resume hypertonic saline when available  [name] MD [facility] 08/08/2023, 3:28 PM  CC: Plotnikov, Oakley Bellman, MD

## 2023-08-08 NOTE — Progress Notes (Deleted)
 Lindsay Schultz    [id number]    Apr 11, 1951  Primary Care Physician:Plotnikov, Lindsay Bellman, MD  Referring Physician: Genia Kettering, MD [address] [place],  [place] [address]  Chief complaint:   Follow up for Bronchiectasis with MAI infection Sinusitis  Seasonal allergies GERD  HPI: Lindsay Schultz is a 73 y.o.  female with history of asthmatic bronchitis, sinusitis, and seasonal allergies.   She's had a workup for her bronchiectasis including CBC, Ig levels, sputum cultures, A1AT levels, RF which were normal. PFTs do not show any obstruction and very mild restriction and reduction in diffusion capacity.   Underwent bronchoscopy on 12/22/2017 with cultures growing MAI She has been seen by ID and started on antibiotic therapy. Managed by Dr. Seymour Schultz Completed 18 months of therapy.  Taken off antimicrobial therapy on June 2021 with intention to follow-up with CT. Per ID note if things worsen either clinically or radiographically + clinically, will consider restarting 3 drug therapy + three times weekly amikacin.    Had a mild bronchiectasis flare in January 2022 requiring prednisone  for 5 days Finished pulmonary rehab in [place] in early 2022  She has attacks of bronchiectasis exacerbations about once a year. She states that the infection usually begins in the sinus and then moves into the lung. This usually resolves after course of antibiotics. She has H/O seasonal allergies (worst in fall and spring) and asthmatic bronchitis. Never smoked.   Interim history: Here for routine visit.  States that she is doing well.  Still has some mild cough but is using the percussion vest States that breathing is stable without any issues.  Outpatient Encounter Medications as of 08/08/2023  Medication Sig   acetaminophen  (TYLENOL ) 500 MG tablet Take 500-1,000 mg by mouth every 6 (six) hours as needed (for pain.).   albuterol  (VENTOLIN  HFA) 108 (90 Base) MCG/ACT inhaler inhale 2 puffs  EVERY 6 HOURS AS NEEDED wheezing & SHORTNESS OF BREATH   amLODipine  (NORVASC ) 2.5 MG tablet Take 1 tablet (2.5 mg total) by mouth daily.   butalbital -acetaminophen -caffeine  (FIORICET) 50-325-40 MG tablet Take 1 tablet by mouth every 6 (six) hours as needed for headache.   calcitonin, salmon, (MIACALCIN/FORTICAL) 200 UNIT/ACT nasal spray Place 1 spray into alternate nostrils daily.   Carboxymethylcellulose Sodium 0.25 % SOLN Place 1 drop into both eyes 3 (three) times daily as needed (for dry/irritated eyes.).   cholecalciferol (VITAMIN D ) 1000 units tablet Take 1,000 Units by mouth 2 (two) times daily.   CRANBERRY PO Take 1 capsule by mouth every evening.   Cyanocobalamin  (VITAMIN B-12) 500 MCG SUBL 1 tab sl qd (Patient taking differently: Take 500 mcg by mouth 2 (two) times daily.)   fluticasone  (FLONASE ) 50 MCG/ACT nasal spray Place 2 sprays into both nostrils daily as needed for allergies.   nadolol  (CORGARD ) 40 MG tablet Take 0.5 tablets (20 mg total) by mouth 2 (two) times daily.   pantoprazole  (PROTONIX ) 40 MG tablet Take 1 tablet (40 mg total) by mouth 2 (two) times daily.   No facility-administered encounter medications on file as of 08/08/2023.   Physical Exam: Blood pressure (!) 171/72, pulse (!) 54, height 5\' 3"  (1.6 m), weight 101 lb (45.8 kg), SpO2 97%. Gen:      No acute distress HEENT:  EOMI, sclera anicteric Neck:     No masses; no thyromegaly Lungs:    Clear to auscultation bilaterally; normal respiratory effort CV:  Regular rate and rhythm; no murmurs Abd:      + bowel sounds; soft, non-tender; no palpable masses, no distension Ext:    No edema; adequate peripheral perfusion Skin:      Warm and dry; no rash Neuro: alert and oriented x 3 Psych: normal mood and affect   Data Reviewed: Imaging CT scan 11/15/14-Multiple nodules in the left upper lobe, bilateral bronchiectasis. CT scan 01/24/16 Stable pulmonary nodules, bilateral bronchiectasis. Left upper lobe ground  glass opacities. Images reviewed. CT scan 11/16/2017- stable pulmonary nodules, bilateral bronchiectasis, new area of opacity in the right upper lobe consistent with mucus impaction.  CT chest 07/25/2019-stable bronchiectasis with mild increase in tree-in-bud appearance in the right upper lobe.  Stable pulmonary nodules. CT chest 01/29/2020-slight interval worsening of tree-in-bud appearance with nodular consolidation in the apical right upper lobe. CT chest 06/19/2020-increased nodularity of the lungs, bronchiectasis CT chest 06/22/2021-stable inflammatory changes in the lungs with tree-in-bud suggestive of chronic MAI. CT chest 07/02/2022-mild improvement in bronchiectasis, pulm nodules and mucous plugging. I reviewed the images personally.  Labs 12/09/14 IgG-773 IgG 8-2 37 IgM-64 IgE-56 Rheumatoid factor < 10 All within normal limits.   AFB and fungal cultures 11/19/14- no acid-fast bacilli, Candida albicans. 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 Repeat sputum AFB 01/05/2018-negative  Bronchoscopy 02/21/2018 Cultures- Mycobacterium avium complex BAL cell count-WBC 635, 9% lymphs, 1% eos, 76% neutrophils   PFTs  02/10/15 FVC 2.35 (74%), FEV1 1.79 (74%), F/F 77, TLC 79%, DLCO 78% No obstruction. Mild reduction in lung volumes and diffusion capacity  Assessment:  Bronchiectasis, MAC infection Off treatment since June 2021 after completing 18 months of therapy CT scan changes show slight improvement.  Discussed with Dr. Seymour Schultz with ID.  We will continue to observe for now If she needs to go back on therapy then will need to use inhaled versus IV amikacin  Continue chest percussion with vest and Mucinex  for mucociliary clearance. Hypertonic saline is on backorder  Asthmatic  Bronchitis She is asymptomatic with symptoms mostly during spring and fall allergy. She'll continue the albuterol  when necessary.  GERD Continue Zantac.  Health  maintenance 02/07/2016-Prevnar 05/28/2014-Pneumovax  Up-to-date with flu and Covid.   Plan/Recommendations: - Continue albuterol  as needed - Percussion vest, resume hypertonic saline when available  Lindsay Breeze MD [facility] 08/08/2023, 3:31 PM  CC: Plotnikov, Lindsay Bellman, MD

## 2023-08-08 NOTE — Patient Instructions (Signed)
 VISIT SUMMARY:  During your visit today, we reviewed your current health status and management of your conditions, including asthmatic bronchitis, bronchiectasis, and acid reflux. Your symptoms are stable, and we discussed your ongoing treatment plan to ensure continued control of your conditions.  YOUR PLAN:  -ASTHMATIC BRONCHITIS: Asthmatic bronchitis is a condition where the airways in your lungs become inflamed and produce excess mucus, leading to coughing and difficulty breathing. Your symptoms are well-controlled, and you use an albuterol  inhaler only a few times a month. We have renewed your prescription for the albuterol  inhaler.  -BRONCHIECTASIS: Bronchiectasis is a condition where the airways in your lungs become widened and scarred, leading to mucus buildup and frequent infections. Your symptoms are stable with a morning cough that clears up during the day. You completed treatment for MAI infection in June 2021 and continue to use a chest percussion vest daily. You should continue using Mucinex  as needed. We recommend considering an RSV vaccination due to your lung issues, as RSV can lead to hospitalization. No CT scan is needed this year; we will reassess next year or based on your symptoms.  -ACID REFLUX: Acid reflux occurs when stomach acid flows back into the tube connecting your mouth and stomach, causing heartburn and other symptoms. Your condition is well-controlled with Protonix .  INSTRUCTIONS:  Please continue with your current treatments and medications as discussed. Consider getting an RSV vaccination to protect your lungs. We will reassess the need for a CT scan next year or sooner if your symptoms change. Follow up with us  if you have any new or worsening symptoms.

## 2023-08-22 ENCOUNTER — Ambulatory Visit (INDEPENDENT_AMBULATORY_CARE_PROVIDER_SITE_OTHER): Payer: Medicare Other | Admitting: Internal Medicine

## 2023-08-22 ENCOUNTER — Other Ambulatory Visit

## 2023-08-22 ENCOUNTER — Ambulatory Visit (INDEPENDENT_AMBULATORY_CARE_PROVIDER_SITE_OTHER)
Admission: RE | Admit: 2023-08-22 | Discharge: 2023-08-22 | Disposition: A | Discharge status: Home / Self Care | Source: Ambulatory Visit | Attending: Internal Medicine | Admitting: Internal Medicine

## 2023-08-22 ENCOUNTER — Ambulatory Visit (HOSPITAL_BASED_OUTPATIENT_CLINIC_OR_DEPARTMENT_OTHER): Admission: RE | Admit: 2023-08-22 | Source: Ambulatory Visit | Admitting: Radiology

## 2023-08-22 ENCOUNTER — Encounter: Payer: Self-pay | Admitting: Internal Medicine

## 2023-08-22 VITALS — BP 178/98 | HR 51 | Temp 97.6°F | Ht 63.0 in | Wt 99.6 lb

## 2023-08-22 DIAGNOSIS — M949 Disorder of cartilage, unspecified: Secondary | ICD-10-CM

## 2023-08-22 DIAGNOSIS — M899 Disorder of bone, unspecified: Secondary | ICD-10-CM

## 2023-08-22 DIAGNOSIS — M810 Age-related osteoporosis without current pathological fracture: Secondary | ICD-10-CM | POA: Diagnosis not present

## 2023-08-22 DIAGNOSIS — R634 Abnormal weight loss: Secondary | ICD-10-CM

## 2023-08-22 DIAGNOSIS — R944 Abnormal results of kidney function studies: Secondary | ICD-10-CM

## 2023-08-22 DIAGNOSIS — I10 Essential (primary) hypertension: Secondary | ICD-10-CM | POA: Diagnosis not present

## 2023-08-22 DIAGNOSIS — E538 Deficiency of other specified B group vitamins: Secondary | ICD-10-CM

## 2023-08-22 DIAGNOSIS — K219 Gastro-esophageal reflux disease without esophagitis: Secondary | ICD-10-CM | POA: Diagnosis not present

## 2023-08-22 LAB — COMPREHENSIVE METABOLIC PANEL WITH GFR
ALT: 15 U/L (ref 0–35)
AST: 21 U/L (ref 0–37)
Albumin: 4.6 g/dL (ref 3.5–5.2)
Alkaline Phosphatase: 72 U/L (ref 39–117)
BUN: 12 mg/dL (ref 6–23)
CO2: 31 meq/L (ref 19–32)
Calcium: 9.4 mg/dL (ref 8.4–10.5)
Chloride: 102 meq/L (ref 96–112)
Creatinine, Ser: 0.72 mg/dL (ref 0.40–1.20)
GFR: 83.58 mL/min (ref >60.00)
Glucose, Bld: 83 mg/dL (ref 70–99)
Potassium: 3.4 meq/L — ABNORMAL LOW (ref 3.5–5.1)
Sodium: 142 meq/L (ref 135–145)
Total Bilirubin: 0.5 mg/dL (ref 0.2–1.2)
Total Protein: 7.2 g/dL (ref 6.0–8.3)

## 2023-08-22 LAB — CBC WITH DIFFERENTIAL/PLATELET
Basophils Absolute: 0 10*3/uL (ref 0.0–0.1)
Basophils Relative: 0.4 % (ref 0.0–3.0)
Eosinophils Absolute: 0.1 10*3/uL (ref 0.0–0.7)
Eosinophils Relative: 1.3 % (ref 0.0–5.0)
HCT: 39.4 % (ref 36.0–46.0)
Hemoglobin: 13.3 g/dL (ref 12.0–15.0)
Lymphocytes Relative: 24.5 % (ref 12.0–46.0)
Lymphs Abs: 1.4 10*3/uL (ref 0.7–4.0)
MCHC: 33.6 g/dL (ref 30.0–36.0)
MCV: 92 fl (ref 78.0–100.0)
Monocytes Absolute: 0.5 10*3/uL (ref 0.1–1.0)
Monocytes Relative: 8.6 % (ref 3.0–12.0)
Neutro Abs: 3.7 10*3/uL (ref 1.4–7.7)
Neutrophils Relative %: 65.2 % (ref 43.0–77.0)
Platelets: 211 10*3/uL (ref 150.0–400.0)
RBC: 4.29 Mil/uL (ref 3.87–5.11)
RDW: 13.4 % (ref 11.5–15.5)
WBC: 5.7 10*3/uL (ref 4.0–10.5)

## 2023-08-22 LAB — URINALYSIS, ROUTINE W REFLEX MICROSCOPIC
Bilirubin Urine: NEGATIVE
Hgb urine dipstick: NEGATIVE
Ketones, ur: NEGATIVE
Leukocytes,Ua: NEGATIVE
Nitrite: NEGATIVE
Specific Gravity, Urine: 1.025 (ref 1.000–1.030)
Total Protein, Urine: 30 — AB
Urine Glucose: NEGATIVE
Urobilinogen, UA: 0.2 (ref 0.0–1.0)
pH: 6 (ref 5.0–8.0)

## 2023-08-22 LAB — LIPID PANEL
Cholesterol: 225 mg/dL — ABNORMAL HIGH (ref 0–200)
HDL: 56.9 mg/dL (ref >39.00)
LDL Cholesterol: 146 mg/dL — ABNORMAL HIGH (ref 0–99)
NonHDL: 168.29
Total CHOL/HDL Ratio: 4
Triglycerides: 111 mg/dL (ref 0.0–149.0)
VLDL: 22.2 mg/dL (ref 0.0–40.0)

## 2023-08-22 LAB — TSH: TSH: 1.41 u[IU]/mL (ref 0.35–5.50)

## 2023-08-22 MED ORDER — RABEPRAZOLE SODIUM 20 MG PO TBEC: 20.0000 mg | DELAYED_RELEASE_TABLET | Freq: Every day | ORAL | 3 refills | Status: AC

## 2023-08-22 MED ORDER — FAMOTIDINE 40 MG PO TABS: 40.0000 mg | ORAL_TABLET | Freq: Every day | ORAL | 3 refills | Status: AC

## 2023-08-22 MED ORDER — AMLODIPINE BESYLATE 2.5 MG PO TABS: 2.5000 mg | ORAL_TABLET | Freq: Every day | ORAL | 3 refills | Status: AC

## 2023-08-22 MED ORDER — NADOLOL 40 MG PO TABS: 20.0000 mg | ORAL_TABLET | Freq: Two times a day (BID) | ORAL | 3 refills | Status: AC

## 2023-08-22 NOTE — Assessment & Plan Note (Signed)
 Wt 100-103 Lbs at home

## 2023-08-22 NOTE — Assessment & Plan Note (Signed)
 On B12

## 2023-08-22 NOTE — Addendum Note (Signed)
 Addended by: Hildegard Low R on: 08/22/2023 02:39 PM   Modules accepted: Orders

## 2023-08-22 NOTE — Assessment & Plan Note (Signed)
 5/24 GFR 46 Hydrate well, repeat labs

## 2023-08-22 NOTE — Progress Notes (Signed)
 Subjective:  Patient ID: Lindsay Schultz, female    DOB: [birth year]  Age: 73 y.o. MRN: [id number]  CC: Medical Management of Chronic Issues (6 Month Follow Up. Patient notes that the pantoprazole  doesn't seem to be working as well as it had in the past. Still experiencing the symptoms of GERD and nausea, especially in the morning )   HPI Lindsay Schultz presents for a 6 Month Follow Up. Patient notes that the pantoprazole  doesn't seem to be working as well as it had in the past. Still experiencing the symptoms of GERD and nausea, especially in the morning  F/u on HTN  Wt 100-103 Lbs  Outpatient Medications Prior to Visit  Medication Sig Dispense Refill   acetaminophen  (TYLENOL ) 500 MG tablet Take 500-1,000 mg by mouth every 6 (six) hours as needed (for pain.).     albuterol  (VENTOLIN  HFA) 108 (90 Base) MCG/ACT inhaler inhale 2 puffs EVERY 6 HOURS AS NEEDED wheezing & SHORTNESS OF BREATH 18 g 11   butalbital -acetaminophen -caffeine  (FIORICET) 50-325-40 MG tablet Take 1 tablet by mouth every 6 (six) hours as needed for headache. 60 tablet 1   calcitonin, salmon, (MIACALCIN/FORTICAL) 200 UNIT/ACT nasal spray Place 1 spray into alternate nostrils daily. 3.7 mL 11   Carboxymethylcellulose Sodium 0.25 % SOLN Place 1 drop into both eyes 3 (three) times daily as needed (for dry/irritated eyes.).     cholecalciferol (VITAMIN D ) 1000 units tablet Take 1,000 Units by mouth 2 (two) times daily.     CRANBERRY PO Take 1 capsule by mouth every evening.     Cyanocobalamin  (VITAMIN B-12) 500 MCG SUBL 1 tab sl qd (Patient taking differently: Take 500 mcg by mouth 2 (two) times daily.) 100 tablet 11   fluticasone  (FLONASE ) 50 MCG/ACT nasal spray Place 2 sprays into both nostrils daily as needed for allergies. 16 g 11   amLODipine  (NORVASC ) 2.5 MG tablet Take 1 tablet (2.5 mg total) by mouth daily. 90 tablet 3   nadolol  (CORGARD ) 40 MG tablet Take 0.5 tablets (20 mg total) by mouth 2 (two) times daily. 90  tablet 3   pantoprazole  (PROTONIX ) 40 MG tablet Take 1 tablet (40 mg total) by mouth 2 (two) times daily. 90 tablet 3   No facility-administered medications prior to visit.    ROS: Review of Systems  Constitutional:  Negative for activity change, appetite change, chills, fatigue and unexpected weight change.  HENT:  Negative for congestion, mouth sores and sinus pressure.   Eyes:  Negative for visual disturbance.  Respiratory:  Negative for cough and chest tightness.   Gastrointestinal:  Negative for abdominal pain and nausea.  Genitourinary:  Negative for difficulty urinating, frequency and vaginal pain.  Musculoskeletal:  Negative for back pain and gait problem.  Skin:  Negative for pallor and rash.  Neurological:  Negative for dizziness, tremors, weakness, numbness and headaches.  Psychiatric/Behavioral:  Negative for confusion, sleep disturbance and suicidal ideas.     Objective:  BP (!) 178/98   Pulse (!) 51   Temp 97.6 F (36.4 C)   Ht 5\' 3"  (1.6 m)   Wt 99 lb 9.6 oz (45.2 kg)   SpO2 98%   BMI 17.64 kg/m   BP Readings from Last 3 Encounters:  08/22/23 (!) 178/98  08/08/23 (!) 171/72  02/21/23 130/84    Wt Readings from Last 3 Encounters:  08/22/23 99 lb 9.6 oz (45.2 kg)  08/08/23 101 lb (45.8 kg)  07/13/23 104 lb (47.2 kg)    Physical  Exam Constitutional:      General: She is not in acute distress.    Appearance: Normal appearance. She is well-developed.  HENT:     Head: Normocephalic.     Right Ear: External ear normal.     Left Ear: External ear normal.     Nose: Nose normal.  Eyes:     General:        Right eye: No discharge.        Left eye: No discharge.     Conjunctiva/sclera: Conjunctivae normal.     Pupils: Pupils are equal, round, and reactive to light.  Neck:     Thyroid : No thyromegaly.     Vascular: No JVD.     Trachea: No tracheal deviation.  Cardiovascular:     Rate and Rhythm: Normal rate and regular rhythm.     Heart sounds: Normal  heart sounds.  Pulmonary:     Effort: No respiratory distress.     Breath sounds: No stridor. No wheezing.  Abdominal:     General: Bowel sounds are normal. There is no distension.     Palpations: Abdomen is soft. There is no mass.     Tenderness: There is no abdominal tenderness. There is no guarding or rebound.  Musculoskeletal:        General: No tenderness.     Cervical back: Normal range of motion and neck supple. No rigidity.  Lymphadenopathy:     Cervical: No cervical adenopathy.  Skin:    Findings: No erythema or rash.  Neurological:     Cranial Nerves: No cranial nerve deficit.     Motor: No abnormal muscle tone.     Coordination: Coordination normal.     Deep Tendon Reflexes: Reflexes normal.  Psychiatric:        Behavior: Behavior normal.        Thought Content: Thought content normal.        Judgment: Judgment normal.   Thin  Lab Results  Component Value Date   WBC 6.6 08/18/2022   HGB 13.2 08/18/2022   HCT 39.7 08/18/2022   PLT 217.0 08/18/2022   GLUCOSE 90 02/21/2023   CHOL 210 (H) 08/18/2022   TRIG 169.0 (H) 08/18/2022   HDL 50.50 08/18/2022   LDLDIRECT 140.0 12/13/2017   LDLCALC 126 (H) 08/18/2022   ALT 15 02/21/2023   AST 22 02/21/2023   NA 141 02/21/2023   K 3.9 02/21/2023   CL 101 02/21/2023   CREATININE 0.77 02/21/2023   BUN 13 02/21/2023   CO2 32 02/21/2023   TSH 1.14 08/18/2022   INR 1.05 01/26/2016    MM 3D SCREENING MAMMOGRAM BILATERAL BREAST Result Date: 02/23/2023 CLINICAL DATA:  Screening. EXAM: DIGITAL SCREENING BILATERAL MAMMOGRAM WITH TOMOSYNTHESIS AND CAD TECHNIQUE: Bilateral screening digital craniocaudal and mediolateral oblique mammograms were obtained. Bilateral screening digital breast tomosynthesis was performed. The images were evaluated with computer-aided detection. COMPARISON:  Previous exam(s). ACR Breast Density Category c: The breasts are heterogeneously dense, which may obscure small masses. FINDINGS: There are no  findings suspicious for malignancy. IMPRESSION: No mammographic evidence of malignancy. A result letter of this screening mammogram will be mailed directly to the patient. RECOMMENDATION: Screening mammogram in one year. (Code:SM-B-01Y) BI-RADS CATEGORY  1: Negative. Electronically Signed   By: Anna Barnes M.D.   On: 02/23/2023 14:53    Assessment & Plan:   Problem List Items Addressed This Visit     Essential hypertension - Primary   Cont on Nadolol , Amlodipine   Relevant Medications   amLODipine  (NORVASC ) 2.5 MG tablet   nadolol  (CORGARD ) 40 MG tablet   Other Relevant Orders   TSH   Lipid panel   Comprehensive metabolic panel with GFR   CBC with Differential/Platelet   Urinalysis   GERD (gastroesophageal reflux disease)   Worse      Relevant Medications   famotidine  (PEPCID ) 40 MG tablet   RABEprazole (ACIPHEX) 20 MG tablet   Other Relevant Orders   CBC with Differential/Platelet   B12 deficiency   On B12       Weight loss   Wt 100-103 Lbs at home      Relevant Orders   TSH   Lipid panel   Comprehensive metabolic panel with GFR   CBC with Differential/Platelet   Urinalysis   Decreased GFR   5/24 GFR 46 Hydrate well, repeat labs      Relevant Orders   TSH   Lipid panel   Comprehensive metabolic panel with GFR   CBC with Differential/Platelet   Urinalysis      Meds ordered this encounter  Medications   amLODipine  (NORVASC ) 2.5 MG tablet    Sig: Take 1 tablet (2.5 mg total) by mouth daily.    Dispense:  90 tablet    Refill:  3   nadolol  (CORGARD ) 40 MG tablet    Sig: Take 0.5 tablets (20 mg total) by mouth 2 (two) times daily.    Dispense:  90 tablet    Refill:  3   famotidine  (PEPCID ) 40 MG tablet    Sig: Take 1 tablet (40 mg total) by mouth at bedtime.    Dispense:  90 tablet    Refill:  3   RABEprazole (ACIPHEX) 20 MG tablet    Sig: Take 1 tablet (20 mg total) by mouth daily.    Dispense:  180 tablet    Refill:  3      Follow-up:  Return in about 6 months (around 02/22/2024) for Wellness Exam.  Anitra Barn, MD

## 2023-08-22 NOTE — Assessment & Plan Note (Signed)
 Worse

## 2023-08-22 NOTE — Assessment & Plan Note (Signed)
Cont on Nadolol, Amlodipine

## 2023-08-25 ENCOUNTER — Telehealth: Payer: Self-pay | Admitting: Internal Medicine

## 2023-08-25 MED ORDER — POTASSIUM CHLORIDE ER 8 MEQ PO TBCR: 8.0000 meq | EXTENDED_RELEASE_TABLET | Freq: Every day | ORAL | 0 refills | Status: AC

## 2023-08-25 NOTE — Addendum Note (Signed)
 Addended by: Olene Godfrey V on: 08/25/2023 07:51 AM   Modules accepted: Orders

## 2023-08-25 NOTE — Telephone Encounter (Signed)
 Copied from CRM [id number]. Topic: Clinical - Prescription Issue >> Aug 25, 2023  9:37 AM Deaijah H wrote: Reason for CRM: Giselle w/ [facility] called in due to potassium chloride  (KLOR-CON ) 8 MEQ tablet being called in but only have 10 MEQ and want to know if they can change it. Please call [phone] to confirm

## 2023-08-26 NOTE — Telephone Encounter (Signed)
 Okay to substitute to potassium chloride  10 mEq tablets -  same quantity and directions. Thank you

## 2023-08-26 NOTE — Telephone Encounter (Unsigned)
I was

## 2023-10-05 ENCOUNTER — Other Ambulatory Visit: Payer: Self-pay | Admitting: Internal Medicine

## 2023-10-05 MED ORDER — FLUTICASONE PROPIONATE 50 MCG/ACT NA SUSP: 2.0000 | Freq: Every day | NASAL | 11 refills | Status: AC | PRN

## 2023-12-26 ENCOUNTER — Ambulatory Visit: Payer: Self-pay

## 2023-12-26 NOTE — Telephone Encounter (Signed)
 FYI Only or Action Required?: Action required by provider: request antifungal and call back. Pt declines appt.  Patient was last seen in primary care on 08/22/2023 by Plotnikov, Karlynn GAILS, MD.  Called Nurse Triage reporting Vaginitis.  Symptoms began several days ago.  Interventions attempted: OTC medications: cortisone oitment.  Symptoms are: unchanged.  Triage Disposition: See PCP When Office is Open (Within 3 Days)  Patient/caregiver understands and will follow disposition?: No, wishes to speak with PCP     Copied from CRM #[id number]. Topic: Clinical - Red Word Triage >> Dec 26, 2023  2:23 PM Lindsay Schultz wrote: Red Word that prompted transfer to Nurse Triage: Patient went to [facility] on 09/05 for a UTI was prescribed sulfamethoxazole -trimethoprim  (BACTRIM  DS) 800-160 mg per tablet and now thinks from the medicine has a yeast infection, has itching and redness. Reason for Disposition  [1] Symptoms of a yeast infection (i.e., itchy, white discharge, not bad smelling) AND [2] not improved > 3 days following Care Advice  Answer Assessment - Initial Assessment Questions Patient declined appt and request medication/call back.  Advised UC/ED if symptoms worsen.   Treated with Bactrim  for UTI, at [facility] on 12/23/23, caused yeast infection; was itching and burning on outside; used diflucan . Using cortisone ointment but not helping  1. DISCHARGE: Describe the discharge. (e.g., white, yellow, green, gray, foamy, cottage cheese-like)     no 2. ODOR: Is there a bad odor?     no 3. ONSET: When did the discharge begin?     Worse after taking Bactrim ; 12/23/23 4. RASH: Is there a rash in the genital area? If Yes, ask: Describe it. (e.g., redness, blisters, sores, bumps)     redness 5. ABDOMEN PAIN: Are you having any abdomen pain? If Yes, ask: What does it feel like?  (e.g., crampy, dull, intermittent, constant)      no 6. ABDOMEN PAIN SEVERITY: If present, ask: How bad is it? (e.g.,  Scale 1-10; mild, moderate, or severe)     0 7. CAUSE: What do you think is causing the discharge? Have you had the same problem before? What happened then?     antibiotic 8. OTHER SYMPTOMS: Do you have any other symptoms? (e.g., fever, itching, urination pain, vaginal bleeding, vaginal foreign body)     Itching and burning; itching all day Denies pain with urination, fevers  Protocols used: Vaginal Discharge-A-AH

## 2023-12-30 ENCOUNTER — Other Ambulatory Visit: Payer: Self-pay | Admitting: Family Medicine

## 2023-12-30 MED ORDER — FLUCONAZOLE 150 MG PO TABS: ORAL_TABLET | ORAL | 0 refills | Status: DC

## 2024-01-09 ENCOUNTER — Other Ambulatory Visit: Payer: Self-pay | Admitting: Internal Medicine

## 2024-01-09 MED ORDER — CALCITONIN (SALMON) 200 UNIT/ACT NA SOLN: 1.0000 | Freq: Every day | NASAL | 11 refills | Status: AC

## 2024-01-09 NOTE — Progress Notes (Signed)
 Rx

## 2024-01-16 ENCOUNTER — Other Ambulatory Visit: Payer: Self-pay | Admitting: Internal Medicine

## 2024-01-16 DIAGNOSIS — Z1231 Encounter for screening mammogram for malignant neoplasm of breast: Secondary | ICD-10-CM

## 2024-02-22 ENCOUNTER — Ambulatory Visit
Admission: RE | Admit: 2024-02-22 | Discharge: 2024-02-22 | Disposition: A | Discharge status: Home / Self Care | Source: Ambulatory Visit | Attending: Internal Medicine | Admitting: Internal Medicine

## 2024-02-22 ENCOUNTER — Encounter: Payer: Self-pay | Admitting: Internal Medicine

## 2024-02-22 ENCOUNTER — Ambulatory Visit: Admitting: Internal Medicine

## 2024-02-22 VITALS — BP 136/82 | HR 59 | Temp 98.0°F | Ht 63.0 in | Wt 99.4 lb

## 2024-02-22 DIAGNOSIS — Z0000 Encounter for general adult medical examination without abnormal findings: Secondary | ICD-10-CM

## 2024-02-22 DIAGNOSIS — Z1231 Encounter for screening mammogram for malignant neoplasm of breast: Secondary | ICD-10-CM

## 2024-02-22 DIAGNOSIS — I10 Essential (primary) hypertension: Secondary | ICD-10-CM

## 2024-02-22 DIAGNOSIS — J479 Bronchiectasis, uncomplicated: Secondary | ICD-10-CM | POA: Diagnosis not present

## 2024-02-22 DIAGNOSIS — Z23 Encounter for immunization: Secondary | ICD-10-CM

## 2024-02-22 DIAGNOSIS — R634 Abnormal weight loss: Secondary | ICD-10-CM | POA: Diagnosis not present

## 2024-02-22 LAB — COMPREHENSIVE METABOLIC PANEL WITH GFR
ALT: 16 U/L (ref 0–35)
AST: 23 U/L (ref 0–37)
Albumin: 4.5 g/dL (ref 3.5–5.2)
Alkaline Phosphatase: 81 U/L (ref 39–117)
BUN: 10 mg/dL (ref 6–23)
CO2: 33 meq/L — ABNORMAL HIGH (ref 19–32)
Calcium: 9.5 mg/dL (ref 8.4–10.5)
Chloride: 102 meq/L (ref 96–112)
Creatinine, Ser: 0.73 mg/dL (ref 0.40–1.20)
GFR: 81.92 mL/min (ref >60.00)
Glucose, Bld: 87 mg/dL (ref 70–99)
Potassium: 3.8 meq/L (ref 3.5–5.1)
Sodium: 142 meq/L (ref 135–145)
Total Bilirubin: 0.4 mg/dL (ref 0.2–1.2)
Total Protein: 7.1 g/dL (ref 6.0–8.3)

## 2024-02-22 LAB — URINALYSIS
Bilirubin Urine: NEGATIVE
Hgb urine dipstick: NEGATIVE
Ketones, ur: NEGATIVE
Leukocytes,Ua: NEGATIVE
Nitrite: NEGATIVE
Specific Gravity, Urine: 1.02 (ref 1.000–1.030)
Total Protein, Urine: NEGATIVE
Urine Glucose: NEGATIVE
Urobilinogen, UA: 0.2 (ref 0.0–1.0)
pH: 6.5 (ref 5.0–8.0)

## 2024-02-22 LAB — LIPID PANEL
Cholesterol: 209 mg/dL — ABNORMAL HIGH (ref 0–200)
HDL: 60.7 mg/dL (ref >39.00)
LDL Cholesterol: 116 mg/dL — ABNORMAL HIGH (ref 0–99)
NonHDL: 148.45
Total CHOL/HDL Ratio: 3
Triglycerides: 160 mg/dL — ABNORMAL HIGH (ref 0.0–149.0)
VLDL: 32 mg/dL (ref 0.0–40.0)

## 2024-02-22 LAB — CBC WITH DIFFERENTIAL/PLATELET
Basophils Absolute: 0 K/uL (ref 0.0–0.1)
Basophils Relative: 0.4 % (ref 0.0–3.0)
Eosinophils Absolute: 0.1 K/uL (ref 0.0–0.7)
Eosinophils Relative: 2.1 % (ref 0.0–5.0)
HCT: 38.8 % (ref 36.0–46.0)
Hemoglobin: 13 g/dL (ref 12.0–15.0)
Lymphocytes Relative: 21.3 % (ref 12.0–46.0)
Lymphs Abs: 1.4 K/uL (ref 0.7–4.0)
MCHC: 33.5 g/dL (ref 30.0–36.0)
MCV: 91.1 fl (ref 78.0–100.0)
Monocytes Absolute: 0.5 K/uL (ref 0.1–1.0)
Monocytes Relative: 6.9 % (ref 3.0–12.0)
Neutro Abs: 4.7 K/uL (ref 1.4–7.7)
Neutrophils Relative %: 69.3 % (ref 43.0–77.0)
Platelets: 206 K/uL (ref 150.0–400.0)
RBC: 4.26 Mil/uL (ref 3.87–5.11)
RDW: 13.3 % (ref 11.5–15.5)
WBC: 6.8 K/uL (ref 4.0–10.5)

## 2024-02-22 LAB — TSH: TSH: 0.91 u[IU]/mL (ref 0.35–5.50)

## 2024-02-22 MED ORDER — FLUCONAZOLE 150 MG PO TABS: ORAL_TABLET | ORAL | 1 refills | Status: AC

## 2024-02-22 MED ORDER — SULFAMETHOXAZOLE-TRIMETHOPRIM 800-160 MG PO TABS
ORAL_TABLET | ORAL | 0 refills | Status: AC
Start: 2024-02-22 — End: ?

## 2024-02-22 NOTE — Patient Instructions (Signed)
 SABRA

## 2024-02-22 NOTE — Addendum Note (Signed)
 Addended byBETHA LUCETTA CLEATRICE LELON on: 02/22/2024 03:23 PM   Modules accepted: Orders

## 2024-02-22 NOTE — Assessment & Plan Note (Signed)
 BMI 17.6. Goo appetite

## 2024-02-22 NOTE — Assessment & Plan Note (Signed)
F/u Dr Vaughan Browner ?On a percussive vest daily ?

## 2024-02-22 NOTE — Assessment & Plan Note (Signed)
Cont on Nadolol, Amlodipine

## 2024-02-22 NOTE — Progress Notes (Signed)
 Subjective:  Patient ID: Lindsay Schultz, female    DOB: 07-27-1950  Age: 73 y.o. MRN: [id number]  CC: Medical Management of Chronic Issues (6 Month follow up)   HPI Lindsay Schultz presents for frequent UTIs 2-4 per year lately, vaginal atrophy, B12 def, bronchiectases    Outpatient Medications Prior to Visit  Medication Sig Dispense Refill   acetaminophen  (TYLENOL ) 500 MG tablet Take 500-1,000 mg by mouth every 6 (six) hours as needed (for pain.).     albuterol  (VENTOLIN  HFA) 108 (90 Base) MCG/ACT inhaler inhale 2 puffs EVERY 6 HOURS AS NEEDED wheezing & SHORTNESS OF BREATH 18 g 11   amLODipine  (NORVASC ) 2.5 MG tablet Take 1 tablet (2.5 mg total) by mouth daily. 90 tablet 3   butalbital -acetaminophen -caffeine  (FIORICET) 50-325-40 MG tablet Take 1 tablet by mouth every 6 (six) hours as needed for headache. 60 tablet 1   calcitonin, salmon, (MIACALCIN/FORTICAL) 200 UNIT/ACT nasal spray Place 1 spray into alternate nostrils daily. 3.7 mL 11   Carboxymethylcellulose Sodium 0.25 % SOLN Place 1 drop into both eyes 3 (three) times daily as needed (for dry/irritated eyes.).     cholecalciferol (VITAMIN D ) 1000 units tablet Take 1,000 Units by mouth 2 (two) times daily.     CRANBERRY PO Take 1 capsule by mouth every evening.     Cyanocobalamin  (VITAMIN B-12) 500 MCG SUBL 1 tab sl qd (Patient taking differently: Take 500 mcg by mouth 2 (two) times daily.) 100 tablet 11   famotidine  (PEPCID ) 40 MG tablet Take 1 tablet (40 mg total) by mouth at bedtime. 90 tablet 3   fluticasone  (FLONASE ) 50 MCG/ACT nasal spray Place 2 sprays into both nostrils daily as needed for allergies. 16 g 11   nadolol  (CORGARD ) 40 MG tablet Take 0.5 tablets (20 mg total) by mouth 2 (two) times daily. 90 tablet 3   potassium chloride  (KLOR-CON ) 8 MEQ tablet Take 1 tablet (8 mEq total) by mouth daily. 15 tablet 0   RABEprazole  (ACIPHEX ) 20 MG tablet Take 1 tablet (20 mg total) by mouth daily. 180 tablet 3   fluconazole   (DIFLUCAN ) 150 MG tablet Take 150mg  tablet once, if symptoms are still present in 3 days, may take the second tablet. 2 tablet 0   No facility-administered medications prior to visit.    ROS: Review of Systems  Constitutional:  Negative for activity change, appetite change, chills, fatigue and unexpected weight change.  HENT:  Negative for congestion, mouth sores and sinus pressure.   Eyes:  Negative for visual disturbance.  Respiratory:  Negative for cough and chest tightness.   Gastrointestinal:  Negative for abdominal pain and nausea.  Genitourinary:  Negative for difficulty urinating, frequency and vaginal pain.  Musculoskeletal:  Negative for back pain and gait problem.  Skin:  Negative for pallor and rash.  Neurological:  Negative for dizziness, tremors, weakness, numbness and headaches.  Psychiatric/Behavioral:  Negative for confusion and sleep disturbance.     Objective:  BP 136/82   Pulse (!) 59   Temp 98 F (36.7 C)   Ht 5' 3 (1.6 m)   Wt 99 lb 6.4 oz (45.1 kg)   SpO2 98%   BMI 17.61 kg/m   BP Readings from Last 3 Encounters:  02/22/24 136/82  08/22/23 (!) 178/98  08/08/23 (!) 171/72    Wt Readings from Last 3 Encounters:  02/22/24 99 lb 6.4 oz (45.1 kg)  08/22/23 99 lb 9.6 oz (45.2 kg)  08/08/23 101 lb (45.8 kg)  Physical Exam Constitutional:      General: She is not in acute distress.    Appearance: Normal appearance. She is well-developed.  HENT:     Head: Normocephalic.     Right Ear: External ear normal.     Left Ear: External ear normal.     Nose: Nose normal.  Eyes:     General:        Right eye: No discharge.        Left eye: No discharge.     Conjunctiva/sclera: Conjunctivae normal.     Pupils: Pupils are equal, round, and reactive to light.  Neck:     Thyroid : No thyromegaly.     Vascular: No JVD.     Trachea: No tracheal deviation.  Cardiovascular:     Rate and Rhythm: Normal rate and regular rhythm.     Heart sounds: Normal heart  sounds.  Pulmonary:     Effort: No respiratory distress.     Breath sounds: No stridor. No wheezing.  Abdominal:     General: Bowel sounds are normal. There is no distension.     Palpations: Abdomen is soft. There is no mass.     Tenderness: There is no abdominal tenderness. There is no guarding or rebound.  Musculoskeletal:        General: No tenderness.     Cervical back: Normal range of motion and neck supple. No rigidity.  Lymphadenopathy:     Cervical: No cervical adenopathy.  Skin:    Findings: No erythema or rash.  Neurological:     Mental Status: She is oriented to person, place, and time.     Cranial Nerves: No cranial nerve deficit.     Motor: No abnormal muscle tone.     Coordination: Coordination normal.     Deep Tendon Reflexes: Reflexes normal.  Psychiatric:        Behavior: Behavior normal.        Thought Content: Thought content normal.        Judgment: Judgment normal.   Thin  Lab Results  Component Value Date   WBC 5.7 08/22/2023   HGB 13.3 08/22/2023   HCT 39.4 08/22/2023   PLT 211.0 08/22/2023   GLUCOSE 83 08/22/2023   CHOL 225 (H) 08/22/2023   TRIG 111.0 08/22/2023   HDL 56.90 08/22/2023   LDLDIRECT 140.0 12/13/2017   LDLCALC 146 (H) 08/22/2023   ALT 15 08/22/2023   AST 21 08/22/2023   NA 142 08/22/2023   K 3.4 (L) 08/22/2023   CL 102 08/22/2023   CREATININE 0.72 08/22/2023   BUN 12 08/22/2023   CO2 31 08/22/2023   TSH 1.41 08/22/2023   INR 1.05 01/26/2016    No results found.  Assessment & Plan:   Problem List Items Addressed This Visit     Bronchiectasis without complication (HCC)   F/u Dr Theophilus On a percussive vest daily      Essential hypertension - Primary   Cont on Nadolol , Amlodipine       Weight loss   BMI 17.6. Goo appetite      Well adult exam   Relevant Orders   TSH   Urinalysis   CBC with Differential/Platelet   Lipid panel   Comprehensive metabolic panel with GFR      Meds ordered this encounter   Medications   sulfamethoxazole -trimethoprim  (BACTRIM  DS) 800-160 MG tablet    Sig: 1 po bid x4 days prn cystitis    Dispense:  32 tablet  Refill:  0   fluconazole  (DIFLUCAN ) 150 MG tablet    Sig: 1 po once prn yeast infection    Dispense:  4 tablet    Refill:  1      Follow-up: Return in about 6 months (around 08/21/2024) for Wellness Exam.  Marolyn Noel, MD

## 2024-02-23 ENCOUNTER — Ambulatory Visit: Payer: Self-pay | Admitting: Internal Medicine

## 2024-07-13 ENCOUNTER — Ambulatory Visit

## 2024-08-09 ENCOUNTER — Ambulatory Visit: Admitting: Pulmonary Disease

## 2024-08-21 ENCOUNTER — Ambulatory Visit: Admitting: Internal Medicine
